# Patient Record
Sex: Female | Born: 1935 | Race: Black or African American | Hispanic: No | Marital: Married | Smoking: Never smoker
Health system: Southern US, Community
[De-identification: ages and names within clinical notes are randomized; demographics above are authoritative.]

## PROBLEM LIST (undated history)

## (undated) DIAGNOSIS — I10 Essential (primary) hypertension: Secondary | ICD-10-CM

## (undated) DIAGNOSIS — M1990 Unspecified osteoarthritis, unspecified site: Secondary | ICD-10-CM

## (undated) DIAGNOSIS — I4891 Unspecified atrial fibrillation: Secondary | ICD-10-CM

## (undated) DIAGNOSIS — N3941 Urge incontinence: Secondary | ICD-10-CM

---

## 2005-09-25 ENCOUNTER — Emergency Department: Payer: Self-pay | Admitting: Emergency Medicine

## 2005-11-09 ENCOUNTER — Ambulatory Visit: Payer: Self-pay | Admitting: Gastroenterology

## 2006-06-30 ENCOUNTER — Other Ambulatory Visit: Payer: Self-pay

## 2006-06-30 ENCOUNTER — Inpatient Hospital Stay: Payer: Self-pay | Admitting: Internal Medicine

## 2008-12-09 ENCOUNTER — Emergency Department: Payer: Self-pay | Admitting: Emergency Medicine

## 2009-03-19 DIAGNOSIS — I10 Essential (primary) hypertension: Secondary | ICD-10-CM | POA: Diagnosis present

## 2009-04-16 ENCOUNTER — Ambulatory Visit: Payer: Self-pay | Admitting: Family Medicine

## 2009-04-27 ENCOUNTER — Emergency Department: Payer: Self-pay | Admitting: Internal Medicine

## 2009-06-28 ENCOUNTER — Ambulatory Visit: Payer: Self-pay | Admitting: Family Medicine

## 2009-07-15 ENCOUNTER — Ambulatory Visit: Payer: Self-pay | Admitting: Family Medicine

## 2010-10-14 ENCOUNTER — Ambulatory Visit: Payer: Self-pay | Admitting: Family Medicine

## 2011-02-27 ENCOUNTER — Emergency Department: Payer: Self-pay | Admitting: *Deleted

## 2011-03-12 ENCOUNTER — Ambulatory Visit: Payer: Self-pay | Admitting: Family Medicine

## 2011-05-06 ENCOUNTER — Ambulatory Visit: Payer: Self-pay | Admitting: Family Medicine

## 2011-06-25 ENCOUNTER — Ambulatory Visit: Payer: Self-pay | Admitting: Unknown Physician Specialty

## 2011-07-11 ENCOUNTER — Emergency Department: Payer: Self-pay | Admitting: Emergency Medicine

## 2011-07-11 LAB — CBC
HCT: 44.3 % (ref 35.0–47.0)
HGB: 14 g/dL (ref 12.0–16.0)
MCH: 27.2 pg (ref 26.0–34.0)
MCV: 86 fL (ref 80–100)
RBC: 5.16 10*6/uL (ref 3.80–5.20)
RDW: 15.1 % — ABNORMAL HIGH (ref 11.5–14.5)
WBC: 8.5 10*3/uL (ref 3.6–11.0)

## 2011-07-11 LAB — COMPREHENSIVE METABOLIC PANEL
Alkaline Phosphatase: 78 U/L (ref 50–136)
Anion Gap: 9 (ref 7–16)
Bilirubin,Total: 0.4 mg/dL (ref 0.2–1.0)
Chloride: 107 mmol/L (ref 98–107)
Co2: 28 mmol/L (ref 21–32)
Creatinine: 0.86 mg/dL (ref 0.60–1.30)
EGFR (African American): >60
EGFR (Non-African Amer.): >60
Osmolality: 287 (ref 275–301)
Potassium: 3.5 mmol/L (ref 3.5–5.1)
Total Protein: 8.4 g/dL — ABNORMAL HIGH (ref 6.4–8.2)

## 2011-07-11 LAB — URINALYSIS, COMPLETE
Bilirubin,UR: NEGATIVE
Blood: NEGATIVE
Glucose,UR: NEGATIVE mg/dL (ref 0–75)
Ketone: NEGATIVE
Nitrite: NEGATIVE
Ph: 9 (ref 4.5–8.0)
RBC,UR: 10 /HPF (ref 0–5)
Specific Gravity: 1.008 (ref 1.003–1.030)
Squamous Epithelial: 13
WBC UR: 11 /HPF (ref 0–5)

## 2011-07-11 LAB — TROPONIN I: Troponin-I: <0.02 ng/mL

## 2011-07-13 LAB — URINE CULTURE

## 2011-08-13 ENCOUNTER — Ambulatory Visit: Payer: Self-pay | Admitting: Family Medicine

## 2014-03-26 DIAGNOSIS — M542 Cervicalgia: Secondary | ICD-10-CM | POA: Diagnosis not present

## 2014-03-26 DIAGNOSIS — G43719 Chronic migraine without aura, intractable, without status migrainosus: Secondary | ICD-10-CM | POA: Diagnosis not present

## 2014-03-26 DIAGNOSIS — G518 Other disorders of facial nerve: Secondary | ICD-10-CM | POA: Diagnosis not present

## 2014-03-26 DIAGNOSIS — R51 Headache: Secondary | ICD-10-CM | POA: Diagnosis not present

## 2014-03-26 DIAGNOSIS — M791 Myalgia: Secondary | ICD-10-CM | POA: Diagnosis not present

## 2014-04-11 DIAGNOSIS — G43719 Chronic migraine without aura, intractable, without status migrainosus: Secondary | ICD-10-CM | POA: Diagnosis not present

## 2014-04-11 DIAGNOSIS — M542 Cervicalgia: Secondary | ICD-10-CM | POA: Diagnosis not present

## 2014-04-11 DIAGNOSIS — G518 Other disorders of facial nerve: Secondary | ICD-10-CM | POA: Diagnosis not present

## 2014-04-11 DIAGNOSIS — M791 Myalgia: Secondary | ICD-10-CM | POA: Diagnosis not present

## 2014-04-11 DIAGNOSIS — R51 Headache: Secondary | ICD-10-CM | POA: Diagnosis not present

## 2014-04-22 DIAGNOSIS — R51 Headache: Secondary | ICD-10-CM | POA: Diagnosis not present

## 2014-04-22 DIAGNOSIS — R531 Weakness: Secondary | ICD-10-CM | POA: Diagnosis not present

## 2014-05-10 DIAGNOSIS — I10 Essential (primary) hypertension: Secondary | ICD-10-CM | POA: Diagnosis not present

## 2014-05-10 DIAGNOSIS — J3089 Other allergic rhinitis: Secondary | ICD-10-CM | POA: Diagnosis not present

## 2014-05-10 DIAGNOSIS — M159 Polyosteoarthritis, unspecified: Secondary | ICD-10-CM | POA: Diagnosis not present

## 2014-05-10 DIAGNOSIS — F419 Anxiety disorder, unspecified: Secondary | ICD-10-CM | POA: Diagnosis not present

## 2014-05-23 ENCOUNTER — Emergency Department: Payer: Self-pay | Admitting: Emergency Medicine

## 2014-05-23 DIAGNOSIS — R5383 Other fatigue: Secondary | ICD-10-CM | POA: Diagnosis not present

## 2014-05-23 DIAGNOSIS — E876 Hypokalemia: Secondary | ICD-10-CM | POA: Diagnosis not present

## 2014-06-11 DIAGNOSIS — R51 Headache: Secondary | ICD-10-CM | POA: Diagnosis not present

## 2014-06-11 DIAGNOSIS — G43719 Chronic migraine without aura, intractable, without status migrainosus: Secondary | ICD-10-CM | POA: Diagnosis not present

## 2014-08-06 DIAGNOSIS — F419 Anxiety disorder, unspecified: Secondary | ICD-10-CM | POA: Diagnosis not present

## 2014-08-06 DIAGNOSIS — T7840XD Allergy, unspecified, subsequent encounter: Secondary | ICD-10-CM | POA: Diagnosis not present

## 2014-08-06 DIAGNOSIS — M159 Polyosteoarthritis, unspecified: Secondary | ICD-10-CM | POA: Diagnosis not present

## 2014-08-06 DIAGNOSIS — R51 Headache: Secondary | ICD-10-CM | POA: Diagnosis not present

## 2014-08-06 DIAGNOSIS — I10 Essential (primary) hypertension: Secondary | ICD-10-CM | POA: Diagnosis not present

## 2014-08-12 DIAGNOSIS — G43719 Chronic migraine without aura, intractable, without status migrainosus: Secondary | ICD-10-CM | POA: Diagnosis not present

## 2014-08-13 DIAGNOSIS — R350 Frequency of micturition: Secondary | ICD-10-CM | POA: Diagnosis not present

## 2014-08-13 DIAGNOSIS — M159 Polyosteoarthritis, unspecified: Secondary | ICD-10-CM | POA: Diagnosis not present

## 2014-08-13 DIAGNOSIS — I10 Essential (primary) hypertension: Secondary | ICD-10-CM | POA: Diagnosis not present

## 2014-08-13 DIAGNOSIS — F419 Anxiety disorder, unspecified: Secondary | ICD-10-CM | POA: Diagnosis not present

## 2014-10-08 DIAGNOSIS — R51 Headache: Secondary | ICD-10-CM | POA: Diagnosis not present

## 2014-10-08 DIAGNOSIS — G518 Other disorders of facial nerve: Secondary | ICD-10-CM | POA: Diagnosis not present

## 2014-10-08 DIAGNOSIS — G43719 Chronic migraine without aura, intractable, without status migrainosus: Secondary | ICD-10-CM | POA: Diagnosis not present

## 2014-12-03 DIAGNOSIS — M159 Polyosteoarthritis, unspecified: Secondary | ICD-10-CM | POA: Diagnosis not present

## 2014-12-03 DIAGNOSIS — R51 Headache: Secondary | ICD-10-CM | POA: Diagnosis not present

## 2014-12-03 DIAGNOSIS — F419 Anxiety disorder, unspecified: Secondary | ICD-10-CM | POA: Diagnosis not present

## 2014-12-03 DIAGNOSIS — I10 Essential (primary) hypertension: Secondary | ICD-10-CM | POA: Diagnosis not present

## 2014-12-04 DIAGNOSIS — I10 Essential (primary) hypertension: Secondary | ICD-10-CM | POA: Diagnosis not present

## 2014-12-04 DIAGNOSIS — R51 Headache: Secondary | ICD-10-CM | POA: Diagnosis not present

## 2014-12-04 DIAGNOSIS — R350 Frequency of micturition: Secondary | ICD-10-CM | POA: Diagnosis not present

## 2014-12-04 DIAGNOSIS — M159 Polyosteoarthritis, unspecified: Secondary | ICD-10-CM | POA: Diagnosis not present

## 2014-12-04 DIAGNOSIS — F419 Anxiety disorder, unspecified: Secondary | ICD-10-CM | POA: Diagnosis not present

## 2014-12-10 DIAGNOSIS — Z23 Encounter for immunization: Secondary | ICD-10-CM | POA: Diagnosis not present

## 2014-12-10 DIAGNOSIS — F419 Anxiety disorder, unspecified: Secondary | ICD-10-CM | POA: Diagnosis not present

## 2014-12-10 DIAGNOSIS — I10 Essential (primary) hypertension: Secondary | ICD-10-CM | POA: Diagnosis not present

## 2014-12-10 DIAGNOSIS — M159 Polyosteoarthritis, unspecified: Secondary | ICD-10-CM | POA: Diagnosis not present

## 2014-12-10 DIAGNOSIS — R5383 Other fatigue: Secondary | ICD-10-CM | POA: Diagnosis not present

## 2014-12-23 DIAGNOSIS — G43719 Chronic migraine without aura, intractable, without status migrainosus: Secondary | ICD-10-CM | POA: Diagnosis not present

## 2015-01-08 DIAGNOSIS — H8110 Benign paroxysmal vertigo, unspecified ear: Secondary | ICD-10-CM | POA: Diagnosis not present

## 2015-01-08 DIAGNOSIS — R002 Palpitations: Secondary | ICD-10-CM | POA: Diagnosis not present

## 2015-01-08 DIAGNOSIS — R5383 Other fatigue: Secondary | ICD-10-CM | POA: Diagnosis not present

## 2015-02-14 ENCOUNTER — Emergency Department
Admission: EM | Admit: 2015-02-14 | Discharge: 2015-02-14 | Disposition: A | Discharge status: Home / Self Care | Payer: Commercial Managed Care - HMO | Attending: Emergency Medicine | Admitting: Emergency Medicine

## 2015-02-14 ENCOUNTER — Emergency Department: Payer: Commercial Managed Care - HMO

## 2015-02-14 DIAGNOSIS — R0600 Dyspnea, unspecified: Secondary | ICD-10-CM | POA: Diagnosis not present

## 2015-02-14 DIAGNOSIS — Z880 Allergy status to penicillin: Secondary | ICD-10-CM | POA: Insufficient documentation

## 2015-02-14 DIAGNOSIS — I10 Essential (primary) hypertension: Secondary | ICD-10-CM | POA: Insufficient documentation

## 2015-02-14 DIAGNOSIS — R05 Cough: Secondary | ICD-10-CM | POA: Diagnosis not present

## 2015-02-14 DIAGNOSIS — R0602 Shortness of breath: Secondary | ICD-10-CM | POA: Diagnosis not present

## 2015-02-14 DIAGNOSIS — R0981 Nasal congestion: Secondary | ICD-10-CM | POA: Insufficient documentation

## 2015-02-14 DIAGNOSIS — R51 Headache: Secondary | ICD-10-CM | POA: Diagnosis not present

## 2015-02-14 HISTORY — DX: Unspecified osteoarthritis, unspecified site: M19.90

## 2015-02-14 HISTORY — DX: Essential (primary) hypertension: I10

## 2015-02-14 LAB — CBC
HEMATOCRIT: 39.6 % (ref 35.0–47.0)
HEMOGLOBIN: 12.8 g/dL (ref 12.0–16.0)
MCH: 27.8 pg (ref 26.0–34.0)
MCHC: 32.2 g/dL (ref 32.0–36.0)
MCV: 86.2 fL (ref 80.0–100.0)
Platelets: 218 10*3/uL (ref 150–440)
RBC: 4.6 MIL/uL (ref 3.80–5.20)
RDW: 15.8 % — AB (ref 11.5–14.5)
WBC: 7.7 10*3/uL (ref 3.6–11.0)

## 2015-02-14 LAB — COMPREHENSIVE METABOLIC PANEL
ALK PHOS: 62 U/L (ref 38–126)
ALT: 10 U/L — ABNORMAL LOW (ref 14–54)
AST: 29 U/L (ref 15–41)
Albumin: 3.7 g/dL (ref 3.5–5.0)
Anion gap: 9 (ref 5–15)
BILIRUBIN TOTAL: 1.3 mg/dL — AB (ref 0.3–1.2)
BUN: 19 mg/dL (ref 6–20)
CALCIUM: 10 mg/dL (ref 8.9–10.3)
CO2: 22 mmol/L (ref 22–32)
Chloride: 111 mmol/L (ref 101–111)
Creatinine, Ser: 0.9 mg/dL (ref 0.44–1.00)
GFR calc Af Amer: >60 mL/min (ref >60)
GFR, EST NON AFRICAN AMERICAN: 59 mL/min — AB (ref >60)
Glucose, Bld: 82 mg/dL (ref 65–99)
POTASSIUM: 3.9 mmol/L (ref 3.5–5.1)
Sodium: 142 mmol/L (ref 135–145)
TOTAL PROTEIN: 7.1 g/dL (ref 6.5–8.1)

## 2015-02-14 LAB — TROPONIN I

## 2015-02-14 MED ORDER — OXYMETAZOLINE HCL 0.05 % NA SOLN
1.0000 | Freq: Once | NASAL | Status: AC
  Administered 2015-02-14: 1 via NASAL
  Filled 2015-02-14: qty 15

## 2015-02-14 MED ORDER — IOHEXOL 350 MG/ML SOLN
75.0000 mL | Freq: Once | INTRAVENOUS | Status: AC | PRN
Start: 2015-02-14 — End: 2015-02-14
  Administered 2015-02-14: 75 mL via INTRAVENOUS

## 2015-02-14 NOTE — ED Provider Notes (Signed)
[facility] Emergency Department Provider Note  Time seen: 5:09 PM  I have reviewed the triage vital signs and the nursing notes.   HISTORY  Chief Complaint Shortness of Breath    HPI Cassandra Weiss is a 81 y.o. female with a past medical history of hypertension, arthritis, who presents the emergency department with dyspnea. According to the patient for the past 2 days she has been feeling short of breath, somewhat worse with exertion. Denies any chest pain, pressure, tightness. Denies any fever. She does state nasal congestion, but denies cough. Upon arrival to the emergency department the patient has 100% O2 saturation on room air. She does take very quick small breaths, unclear as to the cause. States she feels mildly short of breath currently. Denies any chest discomfort or pleuritic pain.     Past Medical History  Diagnosis Date  . Arthritis   . Hypertension     There are no active problems to display for this patient.   History reviewed. No pertinent past surgical history.  No current outpatient prescriptions on file.  Allergies Penicillins  No family history on file.  Social History Social History  Substance Use Topics  . Smoking status: Never Smoker   . Smokeless tobacco: None  . Alcohol Use: No    Review of Systems Constitutional: Negative for fever. Eyes: Positive nasal congestion Cardiovascular: Negative for chest pain. Respiratory: Positive shortness of breath Gastrointestinal: Negative for abdominal pain Musculoskeletal: Negative for back pain. Neurological: Negative for headache 10-point ROS otherwise negative.  ____________________________________________   PHYSICAL EXAM:  VITAL SIGNS: ED Triage Vitals  Enc Vitals Group     BP 02/14/15 1439 146/61 mmHg     Pulse Rate 02/14/15 1439 52     Resp 02/14/15 1439 26     Temp 02/14/15 1439 97.8 F (36.6 C)     Temp Source 02/14/15 1439 Oral     SpO2 02/14/15 1439 99 %      Weight 02/14/15 1439 138 lb (62.596 kg)     Height 02/14/15 1439 5\' 2"  (1.575 m)     Head Cir --      Peak Flow --      Pain Score 02/14/15 1440 6     Pain Loc --      Pain Edu? --      Excl. in GC? --     Constitutional: Alert and oriented. Well appearing and in no distress. Eyes: Normal exam ENT   Head: Normocephalic and atraumatic.   Mouth/Throat: Mucous membranes are moist. Cardiovascular: Normal rate, regular rhythm. No murmur Respiratory: Normal respiratory effort without tachypnea nor retractions. Breath sounds are clear and equal bilaterally. No wheezes/rales/rhonchi. Gastrointestinal: Soft and nontender. No distention.  Musculoskeletal: Nontender with normal range of motion in all extremities. No lower extremity tenderness or edema. Neurologic:  Normal speech and language. No gross focal neurologic deficits Skin:  Skin is warm, dry and intact.  Psychiatric: Mood and affect are normal. Speech and behavior are normal.   ____________________________________________    EKG  EKG reviewed and interpreted by sepsis sinus bradycardia at 55 bpm, narrow QRS, normal axis, normal intervals besides a slightly shortened PR segment, no concerning ST changes noted.  ____________________________________________    RADIOLOGY  Chest x-ray shows no acute abnormality CT head unchanged CT chest shows no acute abnormalities.  ____________________________________________    INITIAL IMPRESSION / ASSESSMENT AND PLAN / ED COURSE  Pertinent labs & imaging results that were available during my care  of the patient were reviewed by me and considered in my medical decision making (see chart for details).  Patient presents the emergency department 2 days of shortness of breath. Upon arrival the patient does take rapid short breaths at times, she does not know why she does this per patient. Denies any chest pain, pressure, tightness. She is in no distress. Overall appears very well  with a normal physical exam. No lower extremity edema or tenderness. Clear breath sounds bilaterally without wheeze, rale or rhonchi. Chest x-ray shows no acute abnormality. EKG appears well. We will check basic labs, we will treat with Afrin as the patient does have moderate nasal congestion which may aide in the patient's breathing.   Ultrasound-guided IV placed by myself, no complications. Well tolerated.  CT chest and head are within normal limits/no abnormalities. Patient states she is feeling better, we will discharge home with primary care follow-up on Monday. It is not entirely clear the cause of her shortness of breath. However she states she does feel better after Afrin administration.  ____________________________________________   FINAL CLINICAL IMPRESSION(S) / ED DIAGNOSES  Dyspnea   Minna Antis, MD 02/14/15 2137

## 2015-02-14 NOTE — ED Notes (Signed)
2 unsuccessful IV attempts by this RN and 1 unsuccessful attempt by Missy SabinsArnell, [employer] Tech. Paduchowski, MD made aware.

## 2015-02-14 NOTE — Discharge Instructions (Signed)

## 2015-02-14 NOTE — ED Notes (Addendum)
Pt c/o SOB for the past 2 days, pt is hyperventilating in triage with short shallow breathes, in NAD, pt instructed to take slow deep breathes, O2 100%.. Pt c/o palpitations with the SOB.. Pt states [facility] and hearing sounds in her head..Marland Kitchen

## 2015-03-04 DIAGNOSIS — G43719 Chronic migraine without aura, intractable, without status migrainosus: Secondary | ICD-10-CM | POA: Diagnosis not present

## 2015-04-03 DIAGNOSIS — R5383 Other fatigue: Secondary | ICD-10-CM | POA: Diagnosis not present

## 2015-04-03 DIAGNOSIS — Z23 Encounter for immunization: Secondary | ICD-10-CM | POA: Diagnosis not present

## 2015-04-03 DIAGNOSIS — I10 Essential (primary) hypertension: Secondary | ICD-10-CM | POA: Diagnosis not present

## 2015-04-03 DIAGNOSIS — M159 Polyosteoarthritis, unspecified: Secondary | ICD-10-CM | POA: Diagnosis not present

## 2015-04-09 DIAGNOSIS — I10 Essential (primary) hypertension: Secondary | ICD-10-CM | POA: Diagnosis not present

## 2015-04-09 DIAGNOSIS — F419 Anxiety disorder, unspecified: Secondary | ICD-10-CM | POA: Diagnosis not present

## 2015-04-09 DIAGNOSIS — R0602 Shortness of breath: Secondary | ICD-10-CM | POA: Diagnosis not present

## 2015-04-16 DIAGNOSIS — R0602 Shortness of breath: Secondary | ICD-10-CM | POA: Diagnosis not present

## 2015-06-03 DIAGNOSIS — G43719 Chronic migraine without aura, intractable, without status migrainosus: Secondary | ICD-10-CM | POA: Diagnosis not present

## 2015-07-31 DIAGNOSIS — I10 Essential (primary) hypertension: Secondary | ICD-10-CM | POA: Diagnosis not present

## 2015-07-31 DIAGNOSIS — F419 Anxiety disorder, unspecified: Secondary | ICD-10-CM | POA: Diagnosis not present

## 2015-07-31 DIAGNOSIS — R0602 Shortness of breath: Secondary | ICD-10-CM | POA: Diagnosis not present

## 2015-08-07 DIAGNOSIS — F419 Anxiety disorder, unspecified: Secondary | ICD-10-CM | POA: Diagnosis not present

## 2015-08-07 DIAGNOSIS — J309 Allergic rhinitis, unspecified: Secondary | ICD-10-CM | POA: Diagnosis not present

## 2015-08-07 DIAGNOSIS — R5383 Other fatigue: Secondary | ICD-10-CM | POA: Diagnosis not present

## 2015-08-07 DIAGNOSIS — R5381 Other malaise: Secondary | ICD-10-CM | POA: Diagnosis not present

## 2015-08-07 DIAGNOSIS — F329 Major depressive disorder, single episode, unspecified: Secondary | ICD-10-CM | POA: Diagnosis not present

## 2015-08-07 DIAGNOSIS — I10 Essential (primary) hypertension: Secondary | ICD-10-CM | POA: Diagnosis not present

## 2015-08-07 DIAGNOSIS — M159 Polyosteoarthritis, unspecified: Secondary | ICD-10-CM | POA: Diagnosis not present

## 2015-09-30 DIAGNOSIS — G43719 Chronic migraine without aura, intractable, without status migrainosus: Secondary | ICD-10-CM | POA: Diagnosis not present

## 2015-10-29 DIAGNOSIS — R5383 Other fatigue: Secondary | ICD-10-CM | POA: Diagnosis not present

## 2015-10-29 DIAGNOSIS — R5381 Other malaise: Secondary | ICD-10-CM | POA: Diagnosis not present

## 2015-10-29 DIAGNOSIS — M159 Polyosteoarthritis, unspecified: Secondary | ICD-10-CM | POA: Diagnosis not present

## 2015-10-29 DIAGNOSIS — I10 Essential (primary) hypertension: Secondary | ICD-10-CM | POA: Diagnosis not present

## 2015-10-29 DIAGNOSIS — J309 Allergic rhinitis, unspecified: Secondary | ICD-10-CM | POA: Diagnosis not present

## 2015-11-05 DIAGNOSIS — F419 Anxiety disorder, unspecified: Secondary | ICD-10-CM | POA: Diagnosis not present

## 2015-11-05 DIAGNOSIS — R5383 Other fatigue: Secondary | ICD-10-CM | POA: Diagnosis not present

## 2015-11-05 DIAGNOSIS — I517 Cardiomegaly: Secondary | ICD-10-CM | POA: Diagnosis not present

## 2015-11-05 DIAGNOSIS — M159 Polyosteoarthritis, unspecified: Secondary | ICD-10-CM | POA: Diagnosis not present

## 2015-11-05 DIAGNOSIS — R0602 Shortness of breath: Secondary | ICD-10-CM | POA: Diagnosis not present

## 2015-11-05 DIAGNOSIS — R5381 Other malaise: Secondary | ICD-10-CM | POA: Diagnosis not present

## 2015-11-05 DIAGNOSIS — F3341 Major depressive disorder, recurrent, in partial remission: Secondary | ICD-10-CM | POA: Diagnosis not present

## 2015-11-05 DIAGNOSIS — I10 Essential (primary) hypertension: Secondary | ICD-10-CM | POA: Diagnosis not present

## 2015-12-16 DIAGNOSIS — Z23 Encounter for immunization: Secondary | ICD-10-CM | POA: Diagnosis not present

## 2015-12-16 DIAGNOSIS — F419 Anxiety disorder, unspecified: Secondary | ICD-10-CM | POA: Diagnosis not present

## 2015-12-16 DIAGNOSIS — I10 Essential (primary) hypertension: Secondary | ICD-10-CM | POA: Diagnosis not present

## 2015-12-16 DIAGNOSIS — R531 Weakness: Secondary | ICD-10-CM | POA: Diagnosis not present

## 2015-12-16 DIAGNOSIS — F329 Major depressive disorder, single episode, unspecified: Secondary | ICD-10-CM | POA: Diagnosis not present

## 2015-12-16 DIAGNOSIS — R197 Diarrhea, unspecified: Secondary | ICD-10-CM | POA: Diagnosis not present

## 2016-03-02 DIAGNOSIS — R0602 Shortness of breath: Secondary | ICD-10-CM | POA: Diagnosis not present

## 2016-03-02 DIAGNOSIS — I10 Essential (primary) hypertension: Secondary | ICD-10-CM | POA: Diagnosis not present

## 2016-03-02 DIAGNOSIS — F419 Anxiety disorder, unspecified: Secondary | ICD-10-CM | POA: Diagnosis not present

## 2016-03-02 DIAGNOSIS — R5383 Other fatigue: Secondary | ICD-10-CM | POA: Diagnosis not present

## 2016-03-02 DIAGNOSIS — R5381 Other malaise: Secondary | ICD-10-CM | POA: Diagnosis not present

## 2016-03-02 DIAGNOSIS — M159 Polyosteoarthritis, unspecified: Secondary | ICD-10-CM | POA: Diagnosis not present

## 2016-03-09 DIAGNOSIS — L600 Ingrowing nail: Secondary | ICD-10-CM | POA: Diagnosis not present

## 2016-03-09 DIAGNOSIS — E538 Deficiency of other specified B group vitamins: Secondary | ICD-10-CM | POA: Diagnosis not present

## 2016-03-09 DIAGNOSIS — I10 Essential (primary) hypertension: Secondary | ICD-10-CM | POA: Diagnosis not present

## 2016-03-09 DIAGNOSIS — J302 Other seasonal allergic rhinitis: Secondary | ICD-10-CM | POA: Diagnosis not present

## 2016-03-09 DIAGNOSIS — Z0000 Encounter for general adult medical examination without abnormal findings: Secondary | ICD-10-CM | POA: Diagnosis not present

## 2016-03-09 DIAGNOSIS — M159 Polyosteoarthritis, unspecified: Secondary | ICD-10-CM | POA: Diagnosis not present

## 2016-03-09 DIAGNOSIS — F419 Anxiety disorder, unspecified: Secondary | ICD-10-CM | POA: Diagnosis not present

## 2016-03-31 DIAGNOSIS — M79675 Pain in left toe(s): Secondary | ICD-10-CM | POA: Diagnosis not present

## 2016-03-31 DIAGNOSIS — B351 Tinea unguium: Secondary | ICD-10-CM | POA: Diagnosis not present

## 2016-03-31 DIAGNOSIS — M79674 Pain in right toe(s): Secondary | ICD-10-CM | POA: Diagnosis not present

## 2016-05-11 DIAGNOSIS — R4781 Slurred speech: Secondary | ICD-10-CM | POA: Diagnosis not present

## 2016-05-11 DIAGNOSIS — E785 Hyperlipidemia, unspecified: Secondary | ICD-10-CM | POA: Diagnosis not present

## 2016-05-11 DIAGNOSIS — I351 Nonrheumatic aortic (valve) insufficiency: Secondary | ICD-10-CM | POA: Diagnosis not present

## 2016-05-11 DIAGNOSIS — I10 Essential (primary) hypertension: Secondary | ICD-10-CM | POA: Diagnosis not present

## 2016-05-11 DIAGNOSIS — R29810 Facial weakness: Secondary | ICD-10-CM | POA: Diagnosis not present

## 2016-05-11 DIAGNOSIS — I480 Paroxysmal atrial fibrillation: Secondary | ICD-10-CM | POA: Diagnosis not present

## 2016-05-11 DIAGNOSIS — F419 Anxiety disorder, unspecified: Secondary | ICD-10-CM | POA: Diagnosis not present

## 2016-05-11 DIAGNOSIS — M1990 Unspecified osteoarthritis, unspecified site: Secondary | ICD-10-CM | POA: Diagnosis not present

## 2016-05-11 DIAGNOSIS — I639 Cerebral infarction, unspecified: Secondary | ICD-10-CM | POA: Diagnosis not present

## 2016-05-11 DIAGNOSIS — R200 Anesthesia of skin: Secondary | ICD-10-CM | POA: Diagnosis not present

## 2016-05-11 DIAGNOSIS — I2510 Atherosclerotic heart disease of native coronary artery without angina pectoris: Secondary | ICD-10-CM | POA: Diagnosis not present

## 2016-05-11 DIAGNOSIS — I6521 Occlusion and stenosis of right carotid artery: Secondary | ICD-10-CM | POA: Diagnosis not present

## 2016-05-11 DIAGNOSIS — R471 Dysarthria and anarthria: Secondary | ICD-10-CM | POA: Diagnosis not present

## 2016-05-11 DIAGNOSIS — G4700 Insomnia, unspecified: Secondary | ICD-10-CM | POA: Diagnosis not present

## 2016-05-11 DIAGNOSIS — I6340 Cerebral infarction due to embolism of unspecified cerebral artery: Secondary | ICD-10-CM | POA: Diagnosis not present

## 2016-05-12 DIAGNOSIS — I2510 Atherosclerotic heart disease of native coronary artery without angina pectoris: Secondary | ICD-10-CM | POA: Diagnosis present

## 2016-05-12 DIAGNOSIS — E785 Hyperlipidemia, unspecified: Secondary | ICD-10-CM | POA: Diagnosis present

## 2016-05-13 DIAGNOSIS — I2510 Atherosclerotic heart disease of native coronary artery without angina pectoris: Secondary | ICD-10-CM | POA: Diagnosis not present

## 2016-05-13 DIAGNOSIS — I6340 Cerebral infarction due to embolism of unspecified cerebral artery: Secondary | ICD-10-CM | POA: Diagnosis not present

## 2016-05-13 DIAGNOSIS — I480 Paroxysmal atrial fibrillation: Secondary | ICD-10-CM | POA: Diagnosis not present

## 2016-05-13 DIAGNOSIS — I10 Essential (primary) hypertension: Secondary | ICD-10-CM | POA: Diagnosis not present

## 2016-05-13 DIAGNOSIS — R471 Dysarthria and anarthria: Secondary | ICD-10-CM | POA: Diagnosis not present

## 2016-05-13 DIAGNOSIS — E785 Hyperlipidemia, unspecified: Secondary | ICD-10-CM | POA: Diagnosis not present

## 2016-05-13 DIAGNOSIS — I6521 Occlusion and stenosis of right carotid artery: Secondary | ICD-10-CM | POA: Diagnosis not present

## 2016-05-13 DIAGNOSIS — R29810 Facial weakness: Secondary | ICD-10-CM | POA: Diagnosis not present

## 2016-05-13 DIAGNOSIS — M1990 Unspecified osteoarthritis, unspecified site: Secondary | ICD-10-CM | POA: Diagnosis not present

## 2016-05-13 DIAGNOSIS — I639 Cerebral infarction, unspecified: Secondary | ICD-10-CM | POA: Diagnosis not present

## 2016-05-14 DIAGNOSIS — I480 Paroxysmal atrial fibrillation: Secondary | ICD-10-CM | POA: Diagnosis present

## 2016-05-17 DIAGNOSIS — J439 Emphysema, unspecified: Secondary | ICD-10-CM | POA: Diagnosis not present

## 2016-05-17 DIAGNOSIS — I480 Paroxysmal atrial fibrillation: Secondary | ICD-10-CM | POA: Diagnosis not present

## 2016-05-17 DIAGNOSIS — I69393 Ataxia following cerebral infarction: Secondary | ICD-10-CM | POA: Diagnosis not present

## 2016-05-17 DIAGNOSIS — F419 Anxiety disorder, unspecified: Secondary | ICD-10-CM | POA: Diagnosis not present

## 2016-05-17 DIAGNOSIS — M1991 Primary osteoarthritis, unspecified site: Secondary | ICD-10-CM | POA: Diagnosis not present

## 2016-05-17 DIAGNOSIS — I10 Essential (primary) hypertension: Secondary | ICD-10-CM | POA: Diagnosis not present

## 2016-05-18 DIAGNOSIS — I480 Paroxysmal atrial fibrillation: Secondary | ICD-10-CM | POA: Diagnosis not present

## 2016-05-18 DIAGNOSIS — E784 Other hyperlipidemia: Secondary | ICD-10-CM | POA: Diagnosis not present

## 2016-05-18 DIAGNOSIS — Z09 Encounter for follow-up examination after completed treatment for conditions other than malignant neoplasm: Secondary | ICD-10-CM | POA: Diagnosis not present

## 2016-05-18 DIAGNOSIS — I639 Cerebral infarction, unspecified: Secondary | ICD-10-CM | POA: Diagnosis not present

## 2016-05-18 DIAGNOSIS — R001 Bradycardia, unspecified: Secondary | ICD-10-CM | POA: Diagnosis not present

## 2016-05-18 DIAGNOSIS — I499 Cardiac arrhythmia, unspecified: Secondary | ICD-10-CM | POA: Diagnosis not present

## 2016-05-19 DIAGNOSIS — M1991 Primary osteoarthritis, unspecified site: Secondary | ICD-10-CM | POA: Diagnosis not present

## 2016-05-19 DIAGNOSIS — J439 Emphysema, unspecified: Secondary | ICD-10-CM | POA: Diagnosis not present

## 2016-05-19 DIAGNOSIS — F419 Anxiety disorder, unspecified: Secondary | ICD-10-CM | POA: Diagnosis not present

## 2016-05-19 DIAGNOSIS — I69393 Ataxia following cerebral infarction: Secondary | ICD-10-CM | POA: Diagnosis not present

## 2016-05-19 DIAGNOSIS — I10 Essential (primary) hypertension: Secondary | ICD-10-CM | POA: Diagnosis not present

## 2016-05-19 DIAGNOSIS — I480 Paroxysmal atrial fibrillation: Secondary | ICD-10-CM | POA: Diagnosis not present

## 2016-05-20 DIAGNOSIS — F419 Anxiety disorder, unspecified: Secondary | ICD-10-CM | POA: Diagnosis not present

## 2016-05-20 DIAGNOSIS — M1991 Primary osteoarthritis, unspecified site: Secondary | ICD-10-CM | POA: Diagnosis not present

## 2016-05-20 DIAGNOSIS — I69393 Ataxia following cerebral infarction: Secondary | ICD-10-CM | POA: Diagnosis not present

## 2016-05-20 DIAGNOSIS — I480 Paroxysmal atrial fibrillation: Secondary | ICD-10-CM | POA: Diagnosis not present

## 2016-05-20 DIAGNOSIS — I10 Essential (primary) hypertension: Secondary | ICD-10-CM | POA: Diagnosis not present

## 2016-05-20 DIAGNOSIS — J439 Emphysema, unspecified: Secondary | ICD-10-CM | POA: Diagnosis not present

## 2016-05-24 DIAGNOSIS — F419 Anxiety disorder, unspecified: Secondary | ICD-10-CM | POA: Diagnosis not present

## 2016-05-24 DIAGNOSIS — J439 Emphysema, unspecified: Secondary | ICD-10-CM | POA: Diagnosis not present

## 2016-05-24 DIAGNOSIS — M1991 Primary osteoarthritis, unspecified site: Secondary | ICD-10-CM | POA: Diagnosis not present

## 2016-05-24 DIAGNOSIS — I10 Essential (primary) hypertension: Secondary | ICD-10-CM | POA: Diagnosis not present

## 2016-05-24 DIAGNOSIS — I69393 Ataxia following cerebral infarction: Secondary | ICD-10-CM | POA: Diagnosis not present

## 2016-05-24 DIAGNOSIS — I480 Paroxysmal atrial fibrillation: Secondary | ICD-10-CM | POA: Diagnosis not present

## 2016-05-25 DIAGNOSIS — J439 Emphysema, unspecified: Secondary | ICD-10-CM | POA: Diagnosis not present

## 2016-05-25 DIAGNOSIS — I480 Paroxysmal atrial fibrillation: Secondary | ICD-10-CM | POA: Diagnosis not present

## 2016-05-25 DIAGNOSIS — F419 Anxiety disorder, unspecified: Secondary | ICD-10-CM | POA: Diagnosis not present

## 2016-05-25 DIAGNOSIS — I10 Essential (primary) hypertension: Secondary | ICD-10-CM | POA: Diagnosis not present

## 2016-05-25 DIAGNOSIS — M1991 Primary osteoarthritis, unspecified site: Secondary | ICD-10-CM | POA: Diagnosis not present

## 2016-05-25 DIAGNOSIS — I69393 Ataxia following cerebral infarction: Secondary | ICD-10-CM | POA: Diagnosis not present

## 2016-05-26 DIAGNOSIS — F419 Anxiety disorder, unspecified: Secondary | ICD-10-CM | POA: Diagnosis not present

## 2016-05-26 DIAGNOSIS — I10 Essential (primary) hypertension: Secondary | ICD-10-CM | POA: Diagnosis not present

## 2016-05-26 DIAGNOSIS — J439 Emphysema, unspecified: Secondary | ICD-10-CM | POA: Diagnosis not present

## 2016-05-26 DIAGNOSIS — M1991 Primary osteoarthritis, unspecified site: Secondary | ICD-10-CM | POA: Diagnosis not present

## 2016-05-26 DIAGNOSIS — I69393 Ataxia following cerebral infarction: Secondary | ICD-10-CM | POA: Diagnosis not present

## 2016-05-26 DIAGNOSIS — I480 Paroxysmal atrial fibrillation: Secondary | ICD-10-CM | POA: Diagnosis not present

## 2016-05-28 DIAGNOSIS — J439 Emphysema, unspecified: Secondary | ICD-10-CM | POA: Diagnosis not present

## 2016-05-28 DIAGNOSIS — E784 Other hyperlipidemia: Secondary | ICD-10-CM | POA: Diagnosis not present

## 2016-05-28 DIAGNOSIS — I638 Other cerebral infarction: Secondary | ICD-10-CM | POA: Diagnosis not present

## 2016-05-28 DIAGNOSIS — I69393 Ataxia following cerebral infarction: Secondary | ICD-10-CM | POA: Diagnosis not present

## 2016-05-28 DIAGNOSIS — I480 Paroxysmal atrial fibrillation: Secondary | ICD-10-CM | POA: Diagnosis not present

## 2016-05-28 DIAGNOSIS — I10 Essential (primary) hypertension: Secondary | ICD-10-CM | POA: Diagnosis not present

## 2016-05-28 DIAGNOSIS — F419 Anxiety disorder, unspecified: Secondary | ICD-10-CM | POA: Diagnosis not present

## 2016-06-01 DIAGNOSIS — M1991 Primary osteoarthritis, unspecified site: Secondary | ICD-10-CM | POA: Diagnosis not present

## 2016-06-01 DIAGNOSIS — I69393 Ataxia following cerebral infarction: Secondary | ICD-10-CM | POA: Diagnosis not present

## 2016-06-01 DIAGNOSIS — F419 Anxiety disorder, unspecified: Secondary | ICD-10-CM | POA: Diagnosis not present

## 2016-06-01 DIAGNOSIS — J439 Emphysema, unspecified: Secondary | ICD-10-CM | POA: Diagnosis not present

## 2016-06-01 DIAGNOSIS — I480 Paroxysmal atrial fibrillation: Secondary | ICD-10-CM | POA: Diagnosis not present

## 2016-06-01 DIAGNOSIS — I10 Essential (primary) hypertension: Secondary | ICD-10-CM | POA: Diagnosis not present

## 2016-06-03 DIAGNOSIS — I480 Paroxysmal atrial fibrillation: Secondary | ICD-10-CM | POA: Diagnosis not present

## 2016-06-03 DIAGNOSIS — I10 Essential (primary) hypertension: Secondary | ICD-10-CM | POA: Diagnosis not present

## 2016-06-03 DIAGNOSIS — I69393 Ataxia following cerebral infarction: Secondary | ICD-10-CM | POA: Diagnosis not present

## 2016-06-03 DIAGNOSIS — F419 Anxiety disorder, unspecified: Secondary | ICD-10-CM | POA: Diagnosis not present

## 2016-06-03 DIAGNOSIS — M1991 Primary osteoarthritis, unspecified site: Secondary | ICD-10-CM | POA: Diagnosis not present

## 2016-06-03 DIAGNOSIS — J439 Emphysema, unspecified: Secondary | ICD-10-CM | POA: Diagnosis not present

## 2016-06-08 DIAGNOSIS — I69393 Ataxia following cerebral infarction: Secondary | ICD-10-CM | POA: Diagnosis not present

## 2016-06-08 DIAGNOSIS — J439 Emphysema, unspecified: Secondary | ICD-10-CM | POA: Diagnosis not present

## 2016-06-08 DIAGNOSIS — M1991 Primary osteoarthritis, unspecified site: Secondary | ICD-10-CM | POA: Diagnosis not present

## 2016-06-08 DIAGNOSIS — I10 Essential (primary) hypertension: Secondary | ICD-10-CM | POA: Diagnosis not present

## 2016-06-08 DIAGNOSIS — I480 Paroxysmal atrial fibrillation: Secondary | ICD-10-CM | POA: Diagnosis not present

## 2016-06-08 DIAGNOSIS — F419 Anxiety disorder, unspecified: Secondary | ICD-10-CM | POA: Diagnosis not present

## 2016-06-09 DIAGNOSIS — I638 Other cerebral infarction: Secondary | ICD-10-CM | POA: Diagnosis not present

## 2016-06-09 DIAGNOSIS — E784 Other hyperlipidemia: Secondary | ICD-10-CM | POA: Diagnosis not present

## 2016-06-09 DIAGNOSIS — I10 Essential (primary) hypertension: Secondary | ICD-10-CM | POA: Diagnosis not present

## 2016-06-09 DIAGNOSIS — I480 Paroxysmal atrial fibrillation: Secondary | ICD-10-CM | POA: Diagnosis not present

## 2016-06-10 DIAGNOSIS — I69393 Ataxia following cerebral infarction: Secondary | ICD-10-CM | POA: Diagnosis not present

## 2016-06-10 DIAGNOSIS — F419 Anxiety disorder, unspecified: Secondary | ICD-10-CM | POA: Diagnosis not present

## 2016-06-10 DIAGNOSIS — J439 Emphysema, unspecified: Secondary | ICD-10-CM | POA: Diagnosis not present

## 2016-06-10 DIAGNOSIS — M1991 Primary osteoarthritis, unspecified site: Secondary | ICD-10-CM | POA: Diagnosis not present

## 2016-06-10 DIAGNOSIS — I10 Essential (primary) hypertension: Secondary | ICD-10-CM | POA: Diagnosis not present

## 2016-06-10 DIAGNOSIS — I480 Paroxysmal atrial fibrillation: Secondary | ICD-10-CM | POA: Diagnosis not present

## 2016-06-15 DIAGNOSIS — I69393 Ataxia following cerebral infarction: Secondary | ICD-10-CM | POA: Diagnosis not present

## 2016-06-15 DIAGNOSIS — I10 Essential (primary) hypertension: Secondary | ICD-10-CM | POA: Diagnosis not present

## 2016-06-15 DIAGNOSIS — I480 Paroxysmal atrial fibrillation: Secondary | ICD-10-CM | POA: Diagnosis not present

## 2016-06-15 DIAGNOSIS — F419 Anxiety disorder, unspecified: Secondary | ICD-10-CM | POA: Diagnosis not present

## 2016-06-15 DIAGNOSIS — M1991 Primary osteoarthritis, unspecified site: Secondary | ICD-10-CM | POA: Diagnosis not present

## 2016-06-15 DIAGNOSIS — J439 Emphysema, unspecified: Secondary | ICD-10-CM | POA: Diagnosis not present

## 2016-06-16 DIAGNOSIS — F419 Anxiety disorder, unspecified: Secondary | ICD-10-CM | POA: Diagnosis not present

## 2016-06-16 DIAGNOSIS — I69393 Ataxia following cerebral infarction: Secondary | ICD-10-CM | POA: Diagnosis not present

## 2016-06-16 DIAGNOSIS — J439 Emphysema, unspecified: Secondary | ICD-10-CM | POA: Diagnosis not present

## 2016-06-16 DIAGNOSIS — I10 Essential (primary) hypertension: Secondary | ICD-10-CM | POA: Diagnosis not present

## 2016-06-16 DIAGNOSIS — I480 Paroxysmal atrial fibrillation: Secondary | ICD-10-CM | POA: Diagnosis not present

## 2016-06-16 DIAGNOSIS — M1991 Primary osteoarthritis, unspecified site: Secondary | ICD-10-CM | POA: Diagnosis not present

## 2016-06-22 DIAGNOSIS — F419 Anxiety disorder, unspecified: Secondary | ICD-10-CM | POA: Diagnosis not present

## 2016-06-22 DIAGNOSIS — I10 Essential (primary) hypertension: Secondary | ICD-10-CM | POA: Diagnosis not present

## 2016-06-22 DIAGNOSIS — J439 Emphysema, unspecified: Secondary | ICD-10-CM | POA: Diagnosis not present

## 2016-06-22 DIAGNOSIS — I69393 Ataxia following cerebral infarction: Secondary | ICD-10-CM | POA: Diagnosis not present

## 2016-06-22 DIAGNOSIS — I480 Paroxysmal atrial fibrillation: Secondary | ICD-10-CM | POA: Diagnosis not present

## 2016-06-22 DIAGNOSIS — M1991 Primary osteoarthritis, unspecified site: Secondary | ICD-10-CM | POA: Diagnosis not present

## 2016-06-23 DIAGNOSIS — F419 Anxiety disorder, unspecified: Secondary | ICD-10-CM | POA: Diagnosis not present

## 2016-06-23 DIAGNOSIS — M1991 Primary osteoarthritis, unspecified site: Secondary | ICD-10-CM | POA: Diagnosis not present

## 2016-06-23 DIAGNOSIS — I10 Essential (primary) hypertension: Secondary | ICD-10-CM | POA: Diagnosis not present

## 2016-06-23 DIAGNOSIS — I69393 Ataxia following cerebral infarction: Secondary | ICD-10-CM | POA: Diagnosis not present

## 2016-06-23 DIAGNOSIS — J439 Emphysema, unspecified: Secondary | ICD-10-CM | POA: Diagnosis not present

## 2016-06-23 DIAGNOSIS — I480 Paroxysmal atrial fibrillation: Secondary | ICD-10-CM | POA: Diagnosis not present

## 2016-06-29 DIAGNOSIS — E785 Hyperlipidemia, unspecified: Secondary | ICD-10-CM | POA: Diagnosis not present

## 2016-06-29 DIAGNOSIS — L600 Ingrowing nail: Secondary | ICD-10-CM | POA: Diagnosis not present

## 2016-06-29 DIAGNOSIS — F419 Anxiety disorder, unspecified: Secondary | ICD-10-CM | POA: Diagnosis not present

## 2016-06-29 DIAGNOSIS — J309 Allergic rhinitis, unspecified: Secondary | ICD-10-CM | POA: Diagnosis not present

## 2016-06-29 DIAGNOSIS — M159 Polyosteoarthritis, unspecified: Secondary | ICD-10-CM | POA: Diagnosis not present

## 2016-06-29 DIAGNOSIS — Z0000 Encounter for general adult medical examination without abnormal findings: Secondary | ICD-10-CM | POA: Diagnosis not present

## 2016-06-29 DIAGNOSIS — R946 Abnormal results of thyroid function studies: Secondary | ICD-10-CM | POA: Diagnosis not present

## 2016-06-29 DIAGNOSIS — I10 Essential (primary) hypertension: Secondary | ICD-10-CM | POA: Diagnosis not present

## 2016-06-30 DIAGNOSIS — I69393 Ataxia following cerebral infarction: Secondary | ICD-10-CM | POA: Diagnosis not present

## 2016-06-30 DIAGNOSIS — J439 Emphysema, unspecified: Secondary | ICD-10-CM | POA: Diagnosis not present

## 2016-06-30 DIAGNOSIS — I480 Paroxysmal atrial fibrillation: Secondary | ICD-10-CM | POA: Diagnosis not present

## 2016-06-30 DIAGNOSIS — I10 Essential (primary) hypertension: Secondary | ICD-10-CM | POA: Diagnosis not present

## 2016-06-30 DIAGNOSIS — F419 Anxiety disorder, unspecified: Secondary | ICD-10-CM | POA: Diagnosis not present

## 2016-06-30 DIAGNOSIS — M1991 Primary osteoarthritis, unspecified site: Secondary | ICD-10-CM | POA: Diagnosis not present

## 2016-07-06 DIAGNOSIS — Z7901 Long term (current) use of anticoagulants: Secondary | ICD-10-CM | POA: Diagnosis not present

## 2016-07-06 DIAGNOSIS — I10 Essential (primary) hypertension: Secondary | ICD-10-CM | POA: Diagnosis not present

## 2016-07-06 DIAGNOSIS — Z0000 Encounter for general adult medical examination without abnormal findings: Secondary | ICD-10-CM | POA: Diagnosis not present

## 2016-07-06 DIAGNOSIS — I639 Cerebral infarction, unspecified: Secondary | ICD-10-CM | POA: Diagnosis not present

## 2016-07-06 DIAGNOSIS — F419 Anxiety disorder, unspecified: Secondary | ICD-10-CM | POA: Diagnosis not present

## 2016-07-06 DIAGNOSIS — I480 Paroxysmal atrial fibrillation: Secondary | ICD-10-CM | POA: Diagnosis not present

## 2016-07-06 DIAGNOSIS — I2510 Atherosclerotic heart disease of native coronary artery without angina pectoris: Secondary | ICD-10-CM | POA: Diagnosis not present

## 2016-07-06 DIAGNOSIS — E784 Other hyperlipidemia: Secondary | ICD-10-CM | POA: Diagnosis not present

## 2016-07-06 DIAGNOSIS — F329 Major depressive disorder, single episode, unspecified: Secondary | ICD-10-CM | POA: Diagnosis not present

## 2016-07-07 DIAGNOSIS — F419 Anxiety disorder, unspecified: Secondary | ICD-10-CM | POA: Diagnosis not present

## 2016-07-07 DIAGNOSIS — J439 Emphysema, unspecified: Secondary | ICD-10-CM | POA: Diagnosis not present

## 2016-07-07 DIAGNOSIS — I69393 Ataxia following cerebral infarction: Secondary | ICD-10-CM | POA: Diagnosis not present

## 2016-07-07 DIAGNOSIS — I10 Essential (primary) hypertension: Secondary | ICD-10-CM | POA: Diagnosis not present

## 2016-07-07 DIAGNOSIS — M1991 Primary osteoarthritis, unspecified site: Secondary | ICD-10-CM | POA: Diagnosis not present

## 2016-07-07 DIAGNOSIS — I480 Paroxysmal atrial fibrillation: Secondary | ICD-10-CM | POA: Diagnosis not present

## 2016-08-04 DIAGNOSIS — I638 Other cerebral infarction: Secondary | ICD-10-CM | POA: Diagnosis not present

## 2016-08-04 DIAGNOSIS — I480 Paroxysmal atrial fibrillation: Secondary | ICD-10-CM | POA: Diagnosis not present

## 2016-08-04 DIAGNOSIS — Z7901 Long term (current) use of anticoagulants: Secondary | ICD-10-CM | POA: Diagnosis not present

## 2016-08-04 DIAGNOSIS — I2510 Atherosclerotic heart disease of native coronary artery without angina pectoris: Secondary | ICD-10-CM | POA: Diagnosis not present

## 2016-08-04 DIAGNOSIS — E784 Other hyperlipidemia: Secondary | ICD-10-CM | POA: Diagnosis not present

## 2016-08-04 DIAGNOSIS — I10 Essential (primary) hypertension: Secondary | ICD-10-CM | POA: Diagnosis not present

## 2016-09-02 DIAGNOSIS — Z7901 Long term (current) use of anticoagulants: Secondary | ICD-10-CM | POA: Diagnosis not present

## 2016-10-01 DIAGNOSIS — Z7901 Long term (current) use of anticoagulants: Secondary | ICD-10-CM | POA: Diagnosis not present

## 2016-11-02 DIAGNOSIS — E784 Other hyperlipidemia: Secondary | ICD-10-CM | POA: Diagnosis not present

## 2016-11-02 DIAGNOSIS — I10 Essential (primary) hypertension: Secondary | ICD-10-CM | POA: Diagnosis not present

## 2016-11-02 DIAGNOSIS — F329 Major depressive disorder, single episode, unspecified: Secondary | ICD-10-CM | POA: Diagnosis not present

## 2016-11-02 DIAGNOSIS — Z0000 Encounter for general adult medical examination without abnormal findings: Secondary | ICD-10-CM | POA: Diagnosis not present

## 2016-11-02 DIAGNOSIS — Z7901 Long term (current) use of anticoagulants: Secondary | ICD-10-CM | POA: Diagnosis not present

## 2016-11-02 DIAGNOSIS — I480 Paroxysmal atrial fibrillation: Secondary | ICD-10-CM | POA: Diagnosis not present

## 2016-11-02 DIAGNOSIS — F419 Anxiety disorder, unspecified: Secondary | ICD-10-CM | POA: Diagnosis not present

## 2016-11-02 DIAGNOSIS — I639 Cerebral infarction, unspecified: Secondary | ICD-10-CM | POA: Diagnosis not present

## 2016-11-02 DIAGNOSIS — I2510 Atherosclerotic heart disease of native coronary artery without angina pectoris: Secondary | ICD-10-CM | POA: Diagnosis not present

## 2016-11-09 DIAGNOSIS — Z0000 Encounter for general adult medical examination without abnormal findings: Secondary | ICD-10-CM | POA: Diagnosis not present

## 2016-11-09 DIAGNOSIS — Z8673 Personal history of transient ischemic attack (TIA), and cerebral infarction without residual deficits: Secondary | ICD-10-CM | POA: Diagnosis not present

## 2016-11-09 DIAGNOSIS — I639 Cerebral infarction, unspecified: Secondary | ICD-10-CM | POA: Diagnosis not present

## 2016-11-09 DIAGNOSIS — I10 Essential (primary) hypertension: Secondary | ICD-10-CM | POA: Diagnosis not present

## 2016-11-09 DIAGNOSIS — Z7901 Long term (current) use of anticoagulants: Secondary | ICD-10-CM | POA: Diagnosis not present

## 2016-11-09 DIAGNOSIS — I2510 Atherosclerotic heart disease of native coronary artery without angina pectoris: Secondary | ICD-10-CM | POA: Diagnosis not present

## 2016-11-09 DIAGNOSIS — F419 Anxiety disorder, unspecified: Secondary | ICD-10-CM | POA: Diagnosis not present

## 2016-11-09 DIAGNOSIS — M159 Polyosteoarthritis, unspecified: Secondary | ICD-10-CM | POA: Diagnosis not present

## 2016-11-09 DIAGNOSIS — F329 Major depressive disorder, single episode, unspecified: Secondary | ICD-10-CM | POA: Diagnosis not present

## 2016-11-09 DIAGNOSIS — I480 Paroxysmal atrial fibrillation: Secondary | ICD-10-CM | POA: Diagnosis not present

## 2016-11-09 DIAGNOSIS — E784 Other hyperlipidemia: Secondary | ICD-10-CM | POA: Diagnosis not present

## 2016-11-16 DIAGNOSIS — I10 Essential (primary) hypertension: Secondary | ICD-10-CM | POA: Diagnosis not present

## 2016-11-16 DIAGNOSIS — I2510 Atherosclerotic heart disease of native coronary artery without angina pectoris: Secondary | ICD-10-CM | POA: Diagnosis not present

## 2016-11-16 DIAGNOSIS — I480 Paroxysmal atrial fibrillation: Secondary | ICD-10-CM | POA: Diagnosis not present

## 2016-11-16 DIAGNOSIS — E784 Other hyperlipidemia: Secondary | ICD-10-CM | POA: Diagnosis not present

## 2016-12-01 DIAGNOSIS — Z7901 Long term (current) use of anticoagulants: Secondary | ICD-10-CM | POA: Diagnosis not present

## 2017-01-03 DIAGNOSIS — Z7901 Long term (current) use of anticoagulants: Secondary | ICD-10-CM | POA: Diagnosis not present

## 2017-01-31 DIAGNOSIS — Z7901 Long term (current) use of anticoagulants: Secondary | ICD-10-CM | POA: Diagnosis not present

## 2017-02-15 DIAGNOSIS — R791 Abnormal coagulation profile: Secondary | ICD-10-CM | POA: Diagnosis not present

## 2017-03-02 DIAGNOSIS — R791 Abnormal coagulation profile: Secondary | ICD-10-CM | POA: Diagnosis not present

## 2017-03-31 DIAGNOSIS — Z23 Encounter for immunization: Secondary | ICD-10-CM | POA: Diagnosis not present

## 2017-03-31 DIAGNOSIS — F419 Anxiety disorder, unspecified: Secondary | ICD-10-CM | POA: Diagnosis not present

## 2017-03-31 DIAGNOSIS — I482 Chronic atrial fibrillation: Secondary | ICD-10-CM | POA: Diagnosis not present

## 2017-03-31 DIAGNOSIS — Z0000 Encounter for general adult medical examination without abnormal findings: Secondary | ICD-10-CM | POA: Diagnosis not present

## 2017-03-31 DIAGNOSIS — I10 Essential (primary) hypertension: Secondary | ICD-10-CM | POA: Diagnosis not present

## 2017-03-31 DIAGNOSIS — R350 Frequency of micturition: Secondary | ICD-10-CM | POA: Diagnosis not present

## 2017-04-28 DIAGNOSIS — Z7901 Long term (current) use of anticoagulants: Secondary | ICD-10-CM | POA: Diagnosis not present

## 2017-06-02 DIAGNOSIS — Z7901 Long term (current) use of anticoagulants: Secondary | ICD-10-CM | POA: Diagnosis not present

## 2017-06-30 DIAGNOSIS — Z7901 Long term (current) use of anticoagulants: Secondary | ICD-10-CM | POA: Diagnosis not present

## 2017-07-22 DIAGNOSIS — Z0000 Encounter for general adult medical examination without abnormal findings: Secondary | ICD-10-CM | POA: Diagnosis not present

## 2017-07-22 DIAGNOSIS — I10 Essential (primary) hypertension: Secondary | ICD-10-CM | POA: Diagnosis not present

## 2017-07-22 DIAGNOSIS — Z7901 Long term (current) use of anticoagulants: Secondary | ICD-10-CM | POA: Diagnosis not present

## 2017-07-22 DIAGNOSIS — I482 Chronic atrial fibrillation: Secondary | ICD-10-CM | POA: Diagnosis not present

## 2017-07-22 DIAGNOSIS — F419 Anxiety disorder, unspecified: Secondary | ICD-10-CM | POA: Diagnosis not present

## 2017-07-22 DIAGNOSIS — R350 Frequency of micturition: Secondary | ICD-10-CM | POA: Diagnosis not present

## 2017-07-29 DIAGNOSIS — I2510 Atherosclerotic heart disease of native coronary artery without angina pectoris: Secondary | ICD-10-CM | POA: Diagnosis not present

## 2017-07-29 DIAGNOSIS — Z0000 Encounter for general adult medical examination without abnormal findings: Secondary | ICD-10-CM | POA: Diagnosis not present

## 2017-07-29 DIAGNOSIS — M159 Polyosteoarthritis, unspecified: Secondary | ICD-10-CM | POA: Diagnosis not present

## 2017-07-29 DIAGNOSIS — I10 Essential (primary) hypertension: Secondary | ICD-10-CM | POA: Diagnosis not present

## 2017-07-29 DIAGNOSIS — E7849 Other hyperlipidemia: Secondary | ICD-10-CM | POA: Diagnosis not present

## 2017-07-29 DIAGNOSIS — F419 Anxiety disorder, unspecified: Secondary | ICD-10-CM | POA: Diagnosis not present

## 2017-07-29 DIAGNOSIS — I480 Paroxysmal atrial fibrillation: Secondary | ICD-10-CM | POA: Diagnosis not present

## 2017-08-22 DIAGNOSIS — Z7901 Long term (current) use of anticoagulants: Secondary | ICD-10-CM | POA: Diagnosis not present

## 2017-09-20 DIAGNOSIS — R791 Abnormal coagulation profile: Secondary | ICD-10-CM | POA: Diagnosis not present

## 2017-10-18 DIAGNOSIS — Z7901 Long term (current) use of anticoagulants: Secondary | ICD-10-CM | POA: Diagnosis not present

## 2017-11-10 DIAGNOSIS — E7849 Other hyperlipidemia: Secondary | ICD-10-CM | POA: Diagnosis not present

## 2017-11-10 DIAGNOSIS — I6389 Other cerebral infarction: Secondary | ICD-10-CM | POA: Diagnosis not present

## 2017-11-10 DIAGNOSIS — I639 Cerebral infarction, unspecified: Secondary | ICD-10-CM | POA: Diagnosis not present

## 2017-11-10 DIAGNOSIS — I159 Secondary hypertension, unspecified: Secondary | ICD-10-CM | POA: Diagnosis not present

## 2017-11-10 DIAGNOSIS — I2510 Atherosclerotic heart disease of native coronary artery without angina pectoris: Secondary | ICD-10-CM | POA: Diagnosis not present

## 2017-11-10 DIAGNOSIS — I480 Paroxysmal atrial fibrillation: Secondary | ICD-10-CM | POA: Diagnosis not present

## 2017-11-17 DIAGNOSIS — Z7901 Long term (current) use of anticoagulants: Secondary | ICD-10-CM | POA: Diagnosis not present

## 2017-11-29 DIAGNOSIS — I10 Essential (primary) hypertension: Secondary | ICD-10-CM | POA: Diagnosis not present

## 2017-11-29 DIAGNOSIS — E7849 Other hyperlipidemia: Secondary | ICD-10-CM | POA: Diagnosis not present

## 2017-11-29 DIAGNOSIS — I480 Paroxysmal atrial fibrillation: Secondary | ICD-10-CM | POA: Diagnosis not present

## 2017-11-29 DIAGNOSIS — F419 Anxiety disorder, unspecified: Secondary | ICD-10-CM | POA: Diagnosis not present

## 2017-11-29 DIAGNOSIS — I2510 Atherosclerotic heart disease of native coronary artery without angina pectoris: Secondary | ICD-10-CM | POA: Diagnosis not present

## 2017-11-29 DIAGNOSIS — M159 Polyosteoarthritis, unspecified: Secondary | ICD-10-CM | POA: Diagnosis not present

## 2017-12-06 DIAGNOSIS — I10 Essential (primary) hypertension: Secondary | ICD-10-CM | POA: Diagnosis not present

## 2017-12-06 DIAGNOSIS — F329 Major depressive disorder, single episode, unspecified: Secondary | ICD-10-CM | POA: Diagnosis not present

## 2017-12-06 DIAGNOSIS — F419 Anxiety disorder, unspecified: Secondary | ICD-10-CM | POA: Diagnosis not present

## 2017-12-06 DIAGNOSIS — Z8673 Personal history of transient ischemic attack (TIA), and cerebral infarction without residual deficits: Secondary | ICD-10-CM | POA: Diagnosis not present

## 2017-12-06 DIAGNOSIS — E7849 Other hyperlipidemia: Secondary | ICD-10-CM | POA: Diagnosis not present

## 2017-12-06 DIAGNOSIS — I2510 Atherosclerotic heart disease of native coronary artery without angina pectoris: Secondary | ICD-10-CM | POA: Diagnosis not present

## 2017-12-06 DIAGNOSIS — M159 Polyosteoarthritis, unspecified: Secondary | ICD-10-CM | POA: Diagnosis not present

## 2017-12-06 DIAGNOSIS — R3129 Other microscopic hematuria: Secondary | ICD-10-CM | POA: Diagnosis not present

## 2017-12-15 DIAGNOSIS — Z7901 Long term (current) use of anticoagulants: Secondary | ICD-10-CM | POA: Diagnosis not present

## 2017-12-29 DIAGNOSIS — I480 Paroxysmal atrial fibrillation: Secondary | ICD-10-CM | POA: Diagnosis not present

## 2017-12-29 DIAGNOSIS — R791 Abnormal coagulation profile: Secondary | ICD-10-CM | POA: Diagnosis not present

## 2017-12-29 DIAGNOSIS — I639 Cerebral infarction, unspecified: Secondary | ICD-10-CM | POA: Diagnosis not present

## 2018-01-13 ENCOUNTER — Encounter: Payer: Self-pay | Admitting: Specialist

## 2018-01-13 ENCOUNTER — Other Ambulatory Visit: Payer: Self-pay

## 2018-01-13 ENCOUNTER — Observation Stay
Admission: EM | Admit: 2018-01-13 | Discharge: 2018-01-15 | Disposition: A | Discharge status: Home / Self Care | Payer: Medicare HMO | Attending: Family Medicine | Admitting: Family Medicine

## 2018-01-13 ENCOUNTER — Emergency Department: Payer: Medicare HMO

## 2018-01-13 DIAGNOSIS — Z7901 Long term (current) use of anticoagulants: Secondary | ICD-10-CM | POA: Diagnosis not present

## 2018-01-13 DIAGNOSIS — E785 Hyperlipidemia, unspecified: Secondary | ICD-10-CM | POA: Diagnosis not present

## 2018-01-13 DIAGNOSIS — Z23 Encounter for immunization: Secondary | ICD-10-CM | POA: Insufficient documentation

## 2018-01-13 DIAGNOSIS — R51 Headache: Secondary | ICD-10-CM | POA: Insufficient documentation

## 2018-01-13 DIAGNOSIS — N390 Urinary tract infection, site not specified: Secondary | ICD-10-CM

## 2018-01-13 DIAGNOSIS — R4182 Altered mental status, unspecified: Secondary | ICD-10-CM | POA: Diagnosis present

## 2018-01-13 DIAGNOSIS — Z8673 Personal history of transient ischemic attack (TIA), and cerebral infarction without residual deficits: Secondary | ICD-10-CM | POA: Insufficient documentation

## 2018-01-13 DIAGNOSIS — I10 Essential (primary) hypertension: Secondary | ICD-10-CM | POA: Diagnosis not present

## 2018-01-13 DIAGNOSIS — R8290 Unspecified abnormal findings in urine: Secondary | ICD-10-CM | POA: Diagnosis not present

## 2018-01-13 DIAGNOSIS — I480 Paroxysmal atrial fibrillation: Secondary | ICD-10-CM | POA: Insufficient documentation

## 2018-01-13 DIAGNOSIS — G92 Toxic encephalopathy: Principal | ICD-10-CM | POA: Insufficient documentation

## 2018-01-13 DIAGNOSIS — F23 Brief psychotic disorder: Secondary | ICD-10-CM | POA: Diagnosis not present

## 2018-01-13 HISTORY — DX: Unspecified atrial fibrillation: I48.91

## 2018-01-13 LAB — COMPREHENSIVE METABOLIC PANEL
ALBUMIN: 3.9 g/dL (ref 3.5–5.0)
ALT: 14 U/L (ref 0–44)
ANION GAP: 9 (ref 5–15)
AST: 23 U/L (ref 15–41)
Alkaline Phosphatase: 73 U/L (ref 38–126)
BUN: 22 mg/dL (ref 8–23)
CALCIUM: 9.9 mg/dL (ref 8.9–10.3)
CHLORIDE: 111 mmol/L (ref 98–111)
CO2: 24 mmol/L (ref 22–32)
Creatinine, Ser: 1.08 mg/dL — ABNORMAL HIGH (ref 0.44–1.00)
GFR calc non Af Amer: 46 mL/min — ABNORMAL LOW (ref >60)
GFR, EST AFRICAN AMERICAN: 53 mL/min — AB (ref >60)
GLUCOSE: 103 mg/dL — AB (ref 70–99)
POTASSIUM: 3.5 mmol/L (ref 3.5–5.1)
SODIUM: 144 mmol/L (ref 135–145)
Total Bilirubin: 0.7 mg/dL (ref 0.3–1.2)
Total Protein: 8.2 g/dL — ABNORMAL HIGH (ref 6.5–8.1)

## 2018-01-13 LAB — URINALYSIS, COMPLETE (UACMP) WITH MICROSCOPIC
Bilirubin Urine: NEGATIVE
GLUCOSE, UA: NEGATIVE mg/dL
Ketones, ur: NEGATIVE mg/dL
LEUKOCYTES UA: NEGATIVE
NITRITE: NEGATIVE
Protein, ur: NEGATIVE mg/dL
SPECIFIC GRAVITY, URINE: 1.012 (ref 1.005–1.030)
pH: 6 (ref 5.0–8.0)

## 2018-01-13 LAB — CBC
HCT: 48.1 % — ABNORMAL HIGH (ref 36.0–46.0)
Hemoglobin: 14.9 g/dL (ref 12.0–15.0)
MCH: 26.3 pg (ref 26.0–34.0)
MCHC: 31 g/dL (ref 30.0–36.0)
MCV: 84.8 fL (ref 80.0–100.0)
Platelets: 288 10*3/uL (ref 150–400)
RBC: 5.67 MIL/uL — ABNORMAL HIGH (ref 3.87–5.11)
RDW: 17.5 % — ABNORMAL HIGH (ref 11.5–15.5)
WBC: 7.8 10*3/uL (ref 4.0–10.5)
nRBC: 0 % (ref 0.0–0.2)

## 2018-01-13 LAB — URINE DRUG SCREEN, QUALITATIVE (ARMC ONLY)
AMPHETAMINES, UR SCREEN: NOT DETECTED
BENZODIAZEPINE, UR SCRN: POSITIVE — AB
Barbiturates, Ur Screen: NOT DETECTED
CANNABINOID 50 NG, UR ~~LOC~~: NOT DETECTED
Cocaine Metabolite,Ur ~~LOC~~: NOT DETECTED
MDMA (Ecstasy)Ur Screen: NOT DETECTED
Methadone Scn, Ur: NOT DETECTED
Opiate, Ur Screen: NOT DETECTED
PHENCYCLIDINE (PCP) UR S: NOT DETECTED
TRICYCLIC, UR SCREEN: NOT DETECTED

## 2018-01-13 LAB — PROTIME-INR
INR: 2.1
PROTHROMBIN TIME: 23.3 s — AB (ref 11.4–15.2)

## 2018-01-13 LAB — TROPONIN I: TROPONIN I: 0.03 ng/mL — AB (ref <0.03)

## 2018-01-13 LAB — GLUCOSE, CAPILLARY: GLUCOSE-CAPILLARY: 90 mg/dL (ref 70–99)

## 2018-01-13 MED ORDER — SODIUM CHLORIDE 0.9 % IV SOLN
1.0000 g | Freq: Once | INTRAVENOUS | Status: DC
  Filled 2018-01-13: qty 10

## 2018-01-13 MED ORDER — WARFARIN SODIUM 2.5 MG PO TABS
2.5000 mg | ORAL_TABLET | Freq: Every day | ORAL | Status: DC
  Administered 2018-01-13 – 2018-01-14 (×2): 2.5 mg via ORAL
  Filled 2018-01-13 (×3): qty 1

## 2018-01-13 MED ORDER — LOSARTAN POTASSIUM 50 MG PO TABS
100.0000 mg | ORAL_TABLET | Freq: Every day | ORAL | Status: DC
  Administered 2018-01-13 – 2018-01-15 (×2): 100 mg via ORAL
  Filled 2018-01-13 (×3): qty 2

## 2018-01-13 MED ORDER — ONDANSETRON HCL 4 MG PO TABS: 4.0000 mg | ORAL_TABLET | Freq: Four times a day (QID) | ORAL | Status: DC | PRN

## 2018-01-13 MED ORDER — AMLODIPINE BESYLATE 10 MG PO TABS
10.0000 mg | ORAL_TABLET | Freq: Every day | ORAL | Status: DC
  Administered 2018-01-13 – 2018-01-15 (×2): 10 mg via ORAL
  Filled 2018-01-13 (×3): qty 1

## 2018-01-13 MED ORDER — SODIUM CHLORIDE 0.9 % IV SOLN
1.0000 g | INTRAVENOUS | Status: DC
  Administered 2018-01-14: 1 g via INTRAVENOUS
  Filled 2018-01-13: qty 1
  Filled 2018-01-13: qty 10

## 2018-01-13 MED ORDER — ENOXAPARIN SODIUM 40 MG/0.4ML ~~LOC~~ SOLN: 40.0000 mg | SUBCUTANEOUS | Status: DC

## 2018-01-13 MED ORDER — CARVEDILOL 25 MG PO TABS
25.0000 mg | ORAL_TABLET | Freq: Two times a day (BID) | ORAL | Status: DC
  Administered 2018-01-13 – 2018-01-15 (×3): 25 mg via ORAL
  Filled 2018-01-13 (×4): qty 1

## 2018-01-13 MED ORDER — WARFARIN - PHYSICIAN DOSING INPATIENT
Freq: Every day | Status: DC
  Administered 2018-01-14 – 2018-01-15 (×2)

## 2018-01-13 MED ORDER — ATORVASTATIN CALCIUM 20 MG PO TABS
80.0000 mg | ORAL_TABLET | Freq: Every day | ORAL | Status: DC
  Administered 2018-01-13 – 2018-01-15 (×2): 80 mg via ORAL
  Filled 2018-01-13 (×3): qty 4

## 2018-01-13 MED ORDER — INFLUENZA VAC SPLIT HIGH-DOSE 0.5 ML IM SUSY
0.5000 mL | PREFILLED_SYRINGE | INTRAMUSCULAR | Status: AC
  Administered 2018-01-15: 0.5 mL via INTRAMUSCULAR
  Filled 2018-01-13: qty 0.5

## 2018-01-13 MED ORDER — ACETAMINOPHEN 650 MG RE SUPP: 650.0000 mg | Freq: Four times a day (QID) | RECTAL | Status: DC | PRN

## 2018-01-13 MED ORDER — ONDANSETRON HCL 4 MG/2ML IJ SOLN: 4.0000 mg | Freq: Four times a day (QID) | INTRAMUSCULAR | Status: DC | PRN

## 2018-01-13 MED ORDER — ACETAMINOPHEN 325 MG PO TABS: 650.0000 mg | ORAL_TABLET | Freq: Four times a day (QID) | ORAL | Status: DC | PRN

## 2018-01-13 NOTE — ED Notes (Signed)
Pt transported to room 102 

## 2018-01-13 NOTE — ED Notes (Signed)
Pt resting in bed, eyes closed. Much difficulty obtaining history from pt and pt spouse bedside. Pt spouse states she has been "acting silly" since this morning. States "I believe she has dementia." He has difficulty elaborating stating "from time to time she does silly stuff that doesn't make sense with the most recent episode this morning." Pt tells me her name and how many years she has been married, but does not answer any other questions stating "I dont know and I am tired." Pt in and out cath with Shawna Orleans present, pt spouse bedside verbalizes understanding.

## 2018-01-13 NOTE — ED Triage Notes (Signed)
First Nurse Note:  Arrives with [facility] from [facility] for evaluation of confusion.  Patient brought to [facility] by [facility] today voluntarily.  Patient is AAOx3.  Skin warm and dry. NAD

## 2018-01-13 NOTE — ED Triage Notes (Signed)
Pt's husband states pt called 911 this am, pt states that her son saw her this am and thought she was having a stroke, husband states that is not so, he is in [place] and spoke over the phone.  Pt told husband and police that her son had been kidnapped, pt is mostly keeping her eyes closed in triage, had previously been open for the tech, pt is now whispering about her son.

## 2018-01-13 NOTE — ED Notes (Signed)
Attempt to place IV and admin medications, Pt in CT at this time.

## 2018-01-13 NOTE — H&P (Signed)
[employer] [facility] [place] [facility]    PATIENT NAME: Cassandra Weiss    MR#:  [id number]  DATE OF BIRTH:  18-Mar-1933  DATE OF ADMISSION:  01/13/2018  PRIMARY CARE PHYSICIAN: Barbette Reichmann, MD   REQUESTING/REFERRING PHYSICIAN: Dr. Gladstone Pih  CHIEF COMPLAINT:   Chief Complaint  Patient presents with  . Altered Mental Status    HISTORY OF PRESENT ILLNESS:  Cassandra Weiss  is a 84 y.o. female with a known history of essential hypertension, paroxysmal atrial fibrillation, history of previous CVA, osteoarthritis who presents to the hospital due to altered mental status.  Patient husband is at bedside and also cannot provide a good history, but as per the husband patient apparently was confused and not her baseline therefore was brought to the hospital.  Patient apparently called the police earlier and was having paranoid thoughts about her son and not acting like herself and therefore was brought to the ER.  In the ER patient is alert awake and oriented to time place and person but still continues to have periods of confusion.  Patient denies any dysuria, hematuria, fever chills cough congestion or any other associated symptoms.  She denies any recent changes in her medications.  Patient's urinalysis was not completely positive for UTI but had many bacteria but given her progressive altered mental status and patient having no previous history of dementia or psychiatric illness hospice services were contacted for admission.  PAST MEDICAL HISTORY:   Past Medical History:  Diagnosis Date  . Arthritis   . Atrial fibrillation (HCC)   . Hypertension     PAST SURGICAL HISTORY:  No past surgical history on file.  SOCIAL HISTORY:   Social History   Tobacco Use  . Smoking status: Never Smoker  Substance Use Topics  . Alcohol use: No    FAMILY HISTORY:   Family History  Problem Relation Age of Onset  . Heart disease Mother   . Stroke Mother   . Heart disease  Father     DRUG ALLERGIES:   Allergies  Allergen Reactions  . Penicillins Rash    Has patient had a PCN reaction causing immediate rash, facial/tongue/throat swelling, SOB or lightheadedness with hypotension: Unknown Has patient had a PCN reaction causing severe rash involving mucus membranes or skin necrosis: Unknown Has patient had a PCN reaction that required hospitalization: Unknown Has patient had a PCN reaction occurring within the last 10 years: Unknown If all of the above answers are "NO", then may proceed with Cephalosporin use.     REVIEW OF SYSTEMS:   Review of Systems  Unable to perform ROS: Mental acuity    MEDICATIONS AT HOME:   Prior to Admission medications   Medication Sig Start Date End Date Taking? Authorizing Provider  ALPRAZolam Prudy Feeler) 0.5 MG tablet Take 0.5 mg by mouth at bedtime as needed for sleep. 12/26/17  Yes [provider]  amLODipine (NORVASC) 10 MG tablet Take 10 mg by mouth daily. 12/26/17  Yes [provider]  atorvastatin (LIPITOR) 80 MG tablet Take 80 mg by mouth daily. 12/14/17  Yes [provider]  carvedilol (COREG) 25 MG tablet Take 25 mg by mouth 2 (two) times daily. 01/05/18  Yes [provider]  losartan (COZAAR) 100 MG tablet Take 100 mg by mouth daily. 12/12/17  Yes [provider]  warfarin (COUMADIN) 2.5 MG tablet Take 2.5 mg by mouth daily. 12/30/17  Yes [provider]      VITAL SIGNS:  Blood  pressure (!) 142/72, pulse 71, temperature 98.1 F (36.7 C), temperature source Oral, height 5\' 3"  (1.6 m), weight 72.6 kg, SpO2 97 %.  PHYSICAL EXAMINATION:  Physical Exam  GENERAL:  84 y.o.-year-old patient lying in the bed in no acute distress.  EYES: Pupils equal, round, reactive to light and accommodation. No scleral icterus. Extraocular muscles intact.  HEENT: Head atraumatic, normocephalic. Oropharynx and nasopharynx clear. No oropharyngeal erythema, moist oral mucosa  NECK:   Supple, no jugular venous distention. No thyroid enlargement, no tenderness.  LUNGS: Normal breath sounds bilaterally, no wheezing, rales, rhonchi. No use of accessory muscles of respiration.  CARDIOVASCULAR: S1, S2 RRR. No murmurs, rubs, gallops, clicks.  ABDOMEN: Soft, nontender, nondistended. Bowel sounds present. No organomegaly or mass.  EXTREMITIES: No pedal edema, cyanosis, or clubbing. + 2 pedal & radial pulses b/l.   NEUROLOGIC: Cranial nerves II through XII are intact. No focal Motor or sensory deficits appreciated b/l PSYCHIATRIC: The patient is alert and oriented x 3. SKIN: No obvious rash, lesion, or ulcer.   LABORATORY PANEL:   CBC Recent Labs  Lab 01/13/18 1529  WBC 7.8  HGB 14.9  HCT 48.1*  PLT 288   ------------------------------------------------------------------------------------------------------------------  Chemistries  Recent Labs  Lab 01/13/18 1529  NA 144  K 3.5  CL 111  CO2 24  GLUCOSE 103*  BUN 22  CREATININE 1.08*  CALCIUM 9.9  AST 23  ALT 14  ALKPHOS 73  BILITOT 0.7   ------------------------------------------------------------------------------------------------------------------  Cardiac Enzymes Recent Labs  Lab 01/13/18 1529  TROPONINI 0.03*   ------------------------------------------------------------------------------------------------------------------  RADIOLOGY:  Dg Chest 2 View  Result Date: 01/13/2018 CLINICAL DATA:  Altered mental status EXAM: CHEST - 2 VIEW COMPARISON:  02/14/2015 FINDINGS: Mild cardiomegaly. No focal consolidation or significant effusion. No pneumothorax. IMPRESSION: No active cardiopulmonary disease.  Mild cardiomegaly. Electronically Signed   By: Jasmine Pang M.D.   On: 01/13/2018 19:15   Ct Head Wo Contrast  Result Date: 01/13/2018 CLINICAL DATA:  AMS. Arrives with [facility] from [facility] for evaluation of confusion. Patient brought to [facility] by [facility] today voluntarily. EXAM: CT HEAD WITHOUT CONTRAST TECHNIQUE:  Contiguous axial images were obtained from the base of the skull through the vertex without intravenous contrast. COMPARISON:  CT of the head on 02/14/2015 FINDINGS: Brain: There is mild central and cortical atrophy. Periventricular white matter changes are consistent with small vessel disease. There is no intra or extra-axial fluid collection or mass lesion. The basilar cisterns and ventricles have a normal appearance. There is no CT evidence for acute infarction or hemorrhage. Vascular: There is atherosclerotic calcification of the internal carotid arteries. Skull: Normal. Negative for fracture or focal lesion. Sinuses/Orbits: No acute finding. Other: None. IMPRESSION: 1. Mild atrophy and small vessel disease. 2.  No evidence for acute intracranial abnormality. Electronically Signed   By: Norva Pavlov M.D.   On: 01/13/2018 19:02     IMPRESSION AND PLAN:   84 year old female with past medical history of paroxysmal atrial fibrillation, hypertension, osteoarthritis who presents to the hospital due to altered mental status.  1.  Altered mental status-etiology unclear presently.  Patient CT head is negative. - No acute metabolic source, urine drug screen is pending, urinalysis shows many bacteria but nitrites, leukocyte esterase and white cells are not present to suggest UTI. - We will empirically treat the patient with IV ceftriaxone for now, follow mental status.  Patient has no previous history of dementia or psychiatric illness.  The patient is not improving with supportive therapy would  consider getting a psychiatric evaluation.  2.  History of paroxysmal atrial fibrillation-this is rate controlled. -Continue carvedilol. -Continue Coumadin, INR therapeutic.  3.  Hyperlipidemia-continue atorvastatin.  4.  Essential hypertension-continue Norvasc, carvedilol, losartan.  I will also get a physical therapy evaluation and also care management consult for home health needs.    All the records  are reviewed and case discussed with ED provider. Management plans discussed with the patient, family and they are in agreement.  CODE STATUS: Full code  TOTAL TIME TAKING CARE OF THIS PATIENT: 45 minutes.    Houston Siren M.D on 01/13/2018 at 7:22 PM  Between 7am to 6pm - Pager - [phone]  After 6pm go to [email or web address] - password [email or web address] [facility]  [place] [facility]  Office  [phone]  CC: Primary care physician; Barbette Reichmann, MD

## 2018-01-13 NOTE — ED Provider Notes (Signed)
[facility] Emergency Department Provider Note  ____________________________________________   First MD Initiated Contact with Patient 01/13/18 1626     (approximate)  I have reviewed the triage vital signs and the nursing notes.   HISTORY  Chief Complaint Altered Mental Status   HPI Cassandra Weiss is a 84 y.o. female with a history of arthritis and hypertension was presented to the emergency department today with 1 week of worsening agitation.  Patient's husband is at the bedside and says that the patient called the police this morning without provocation.  Patient denying any nausea, vomiting or diarrhea.  Says that she has frequent headaches and has a headache currently to the right side of her head which is a 9 out of 10 but says that it may be because she has not eaten anything today.  Says that she does get similar headaches when she does not eat.  Denies any weakness or numbness.  Patient's husband says that she has not had a similar orientation in the past.  Patient does take Coumadin for previous stroke.   Past Medical History:  Diagnosis Date  . Arthritis   . Hypertension     There are no active problems to display for this patient.   No past surgical history on file.  Prior to Admission medications   Medication Sig Start Date End Date Taking? Authorizing Provider  ALPRAZolam Prudy Feeler) 0.5 MG tablet Take 0.5 mg by mouth at bedtime as needed for sleep. 12/26/17  Yes [provider]  amLODipine (NORVASC) 10 MG tablet Take 10 mg by mouth daily. 12/26/17  Yes [provider]  atorvastatin (LIPITOR) 80 MG tablet Take 80 mg by mouth daily. 12/14/17  Yes [provider]  carvedilol (COREG) 25 MG tablet Take 25 mg by mouth 2 (two) times daily. 01/05/18  Yes [provider]  losartan (COZAAR) 100 MG tablet Take 100 mg by mouth daily. 12/12/17  Yes [provider]  warfarin (COUMADIN) 2.5 MG tablet Take 2.5 mg by  mouth daily. 12/30/17  Yes [provider]    Allergies Penicillins  No family history on file.  Social History Social History   Tobacco Use  . Smoking status: Never Smoker  Substance Use Topics  . Alcohol use: No  . Drug use: Not on file    Review of Systems  Constitutional: No fever/chills Eyes: No visual changes. ENT: No sore throat. Cardiovascular: Denies chest pain. Respiratory: Denies shortness of breath. Gastrointestinal: No abdominal pain.  No nausea, no vomiting.  No diarrhea.  No constipation. Genitourinary: Negative for dysuria. Musculoskeletal: Negative for back pain. Skin: Negative for rash. Neurological: Negative for headaches, focal weakness or numbness.   ____________________________________________   PHYSICAL EXAM:  VITAL SIGNS: ED Triage Vitals  Enc Vitals Group     BP 01/13/18 1515 (!) 142/72     Pulse Rate 01/13/18 1515 71     Resp --      Temp 01/13/18 1515 98.1 F (36.7 C)     Temp Source 01/13/18 1515 Oral     SpO2 01/13/18 1515 97 %     Weight 01/13/18 1516 160 lb (72.6 kg)     Height 01/13/18 1516 5\' 3"  (1.6 m)     Head Circumference --      Peak Flow --      Pain Score 01/13/18 1546 0     Pain Loc --      Pain Edu? --      Excl.  in GC? --     Constitutional: Alert and oriented to self as well as year and birthdate but not to location. Well appearing and in no acute distress. Eyes: Conjunctivae are normal.  Head: Atraumatic. Nose: No congestion/rhinnorhea. Mouth/Throat: Mucous membranes are moist.  Neck: No stridor.   Cardiovascular: Normal rate, regular rhythm. Grossly normal heart sounds.   Respiratory: Normal respiratory effort.  No retractions. Lungs CTAB. Gastrointestinal: Soft and nontender. No distention. No CVA tenderness. Musculoskeletal: No lower extremity tenderness nor edema.  No joint effusions. Neurologic:  Normal speech and language. No gross focal neurologic deficits are appreciated. Skin:  Skin is  warm, dry and intact. No rash noted. Psychiatric: Mood and affect are normal. Speech and behavior are normal.  ____________________________________________   LABS (all labs ordered are listed, but only abnormal results are displayed)  Labs Reviewed  COMPREHENSIVE METABOLIC PANEL - Abnormal; Notable for the following components:      Result Value   Glucose, Bld 103 (*)    Creatinine, Ser 1.08 (*)    Total Protein 8.2 (*)    GFR calc non Af Amer 46 (*)    GFR calc Af Amer 53 (*)    All other components within normal limits  CBC - Abnormal; Notable for the following components:   RBC 5.67 (*)    HCT 48.1 (*)    RDW 17.5 (*)    All other components within normal limits  URINALYSIS, COMPLETE (UACMP) WITH MICROSCOPIC - Abnormal; Notable for the following components:   Color, Urine YELLOW (*)    APPearance HAZY (*)    Hgb urine dipstick MODERATE (*)    Bacteria, UA MANY (*)    All other components within normal limits  TROPONIN I - Abnormal; Notable for the following components:   Troponin I 0.03 (*)    All other components within normal limits  URINE CULTURE  GLUCOSE, CAPILLARY  URINE DRUG SCREEN, QUALITATIVE ([facility] ONLY)  PROTIME-INR   ____________________________________________  EKG  ED ECG REPORT I, Arelia Longest, the attending physician, personally viewed and interpreted this ECG.   Date: 01/13/2018  EKG Time: 1526  Rate: 72  Rhythm: normal sinus rhythm with occasional PVCs  Axis: Normal  Intervals:none  ST&T Change: No ST segment depression.  Single ST elevation in V2.  No reciprocal depression or inversion.  Single inversion and V6.  ____________________________________________  RADIOLOGY  CT head without acute finding ____________________________________________   PROCEDURES  Procedure(s) performed:   Procedures  Critical Care performed:   ____________________________________________   INITIAL IMPRESSION / ASSESSMENT AND PLAN / ED  COURSE  Pertinent labs & imaging results that were available during my care of the patient were reviewed by me and considered in my medical decision making (see chart for details).  Differential diagnosis includes, but is not limited to, alcohol, illicit or prescription medications, or other toxic ingestion; intracranial pathology such as stroke or intracerebral hemorrhage; fever or infectious causes including sepsis; hypoxemia and/or hypercarbia; uremia; trauma; endocrine related disorders such as diabetes, hypoglycemia, and thyroid-related diseases; hypertensive encephalopathy; etc. As part of my medical decision making, I reviewed the following data within the electronic MEDICAL RECORD NUMBER Notes from prior ED visits  ----------------------------------------- 7:17 PM on 01/13/2018 -----------------------------------------  Patient with urine positive for many bacteria and no squamous cells.  Work-up otherwise appears reassuring initially.  Patient be admitted to the hospital for undifferentiated altered mental status.  Psychiatric diagnoses also remain on the differential.  However, the patient is not a  previous psych diagnoses so it is unclear whether this can be attributed to that at this point.  Signed out to Dr. Cherlynn Kaiser.  Patient as well as family understanding the diagnosis as well as the treatment plan willing to comply. ____________________________________________   FINAL CLINICAL IMPRESSION(S) / ED DIAGNOSES  UTI.  Altered mental status.  NEW MEDICATIONS STARTED DURING THIS VISIT:  New Prescriptions   No medications on file     Note:  This document was prepared using Dragon voice recognition software and may include unintentional dictation errors.     Myrna Blazer, MD 01/13/18 [phone]

## 2018-01-14 ENCOUNTER — Other Ambulatory Visit: Payer: Self-pay

## 2018-01-14 ENCOUNTER — Observation Stay: Payer: Medicare HMO

## 2018-01-14 DIAGNOSIS — N390 Urinary tract infection, site not specified: Secondary | ICD-10-CM | POA: Diagnosis not present

## 2018-01-14 DIAGNOSIS — I10 Essential (primary) hypertension: Secondary | ICD-10-CM | POA: Diagnosis not present

## 2018-01-14 DIAGNOSIS — I480 Paroxysmal atrial fibrillation: Secondary | ICD-10-CM | POA: Diagnosis not present

## 2018-01-14 DIAGNOSIS — R4182 Altered mental status, unspecified: Secondary | ICD-10-CM | POA: Diagnosis not present

## 2018-01-14 LAB — AMMONIA: AMMONIA: 29 umol/L (ref 9–35)

## 2018-01-14 NOTE — Progress Notes (Signed)
[employer] [facility] [place] [facility]   PATIENT NAME: Cassandra Weiss    MR#:  [id number]  DATE OF BIRTH:  July 12, 1932  SUBJECTIVE:  CHIEF COMPLAINT:   Chief Complaint  Patient presents with  . Altered Mental Status  Patient with slow mentation, mild confusion, noted global weakness, await MRI of the brain, physical therapy input appreciated  REVIEW OF SYSTEMS:  CONSTITUTIONAL: No fever, fatigue or weakness.  EYES: No blurred or double vision.  EARS, NOSE, AND THROAT: No tinnitus or ear pain.  RESPIRATORY: No cough, shortness of breath, wheezing or hemoptysis.  CARDIOVASCULAR: No chest pain, orthopnea, edema.  GASTROINTESTINAL: No nausea, vomiting, diarrhea or abdominal pain.  GENITOURINARY: No dysuria, hematuria.  ENDOCRINE: No polyuria, nocturia,  HEMATOLOGY: No anemia, easy bruising or bleeding SKIN: No rash or lesion. MUSCULOSKELETAL: No joint pain or arthritis.   NEUROLOGIC: No tingling, numbness, weakness.  PSYCHIATRY: No anxiety or depression.   ROS  DRUG ALLERGIES:   Allergies  Allergen Reactions  . Penicillins Rash    Has patient had a PCN reaction causing immediate rash, facial/tongue/throat swelling, SOB or lightheadedness with hypotension: Unknown Has patient had a PCN reaction causing severe rash involving mucus membranes or skin necrosis: Unknown Has patient had a PCN reaction that required hospitalization: Unknown Has patient had a PCN reaction occurring within the last 10 years: Unknown If all of the above answers are "NO", then may proceed with Cephalosporin use.     VITALS:  Blood pressure (!) 153/68, pulse 68, temperature 98.7 F (37.1 C), temperature source Oral, resp. rate 18, height 5\' 2"  (1.575 m), weight 86.2 kg, SpO2 97 %.  PHYSICAL EXAMINATION:  GENERAL:  84 y.o.-year-old patient lying in the bed with no acute distress.  EYES: Pupils equal, round, reactive to light and accommodation. No scleral icterus. Extraocular muscles intact.   HEENT: Head atraumatic, normocephalic. Oropharynx and nasopharynx clear.  NECK:  Supple, no jugular venous distention. No thyroid enlargement, no tenderness.  LUNGS: Normal breath sounds bilaterally, no wheezing, rales,rhonchi or crepitation. No use of accessory muscles of respiration.  CARDIOVASCULAR: S1, S2 normal. No murmurs, rubs, or gallops.  ABDOMEN: Soft, nontender, nondistended. Bowel sounds present. No organomegaly or mass.  EXTREMITIES: No pedal edema, cyanosis, or clubbing.  NEUROLOGIC: Cranial nerves II through XII are intact. Muscle strength 5/5 in all extremities. Sensation intact. Gait not checked.  PSYCHIATRIC: The patient is alert and oriented x 3.  SKIN: No obvious rash, lesion, or ulcer.   Physical Exam LABORATORY PANEL:   CBC Recent Labs  Lab 01/13/18 1529  WBC 7.8  HGB 14.9  HCT 48.1*  PLT 288   ------------------------------------------------------------------------------------------------------------------  Chemistries  Recent Labs  Lab 01/13/18 1529  NA 144  K 3.5  CL 111  CO2 24  GLUCOSE 103*  BUN 22  CREATININE 1.08*  CALCIUM 9.9  AST 23  ALT 14  ALKPHOS 73  BILITOT 0.7   ------------------------------------------------------------------------------------------------------------------  Cardiac Enzymes Recent Labs  Lab 01/13/18 1529  TROPONINI 0.03*   ------------------------------------------------------------------------------------------------------------------  RADIOLOGY:  Dg Chest 2 View  Result Date: 01/13/2018 CLINICAL DATA:  Altered mental status EXAM: CHEST - 2 VIEW COMPARISON:  02/14/2015 FINDINGS: Mild cardiomegaly. No focal consolidation or significant effusion. No pneumothorax. IMPRESSION: No active cardiopulmonary disease.  Mild cardiomegaly. Electronically Signed   By: Jasmine Pang M.D.   On: 01/13/2018 19:15   Ct Head Wo Contrast  Result Date: 01/13/2018 CLINICAL DATA:  AMS. Arrives with [facility] from [facility] for  evaluation of confusion. Patient  brought to [facility] by BPD today voluntarily. EXAM: CT HEAD WITHOUT CONTRAST TECHNIQUE: Contiguous axial images were obtained from the base of the skull through the vertex without intravenous contrast. COMPARISON:  CT of the head on 02/14/2015 FINDINGS: Brain: There is mild central and cortical atrophy. Periventricular white matter changes are consistent with small vessel disease. There is no intra or extra-axial fluid collection or mass lesion. The basilar cisterns and ventricles have a normal appearance. There is no CT evidence for acute infarction or hemorrhage. Vascular: There is atherosclerotic calcification of the internal carotid arteries. Skull: Normal. Negative for fracture or focal lesion. Sinuses/Orbits: No acute finding. Other: None. IMPRESSION: 1. Mild atrophy and small vessel disease. 2.  No evidence for acute intracranial abnormality. Electronically Signed   By: Norva Pavlov M.D.   On: 01/13/2018 19:02    ASSESSMENT AND PLAN:  84 year old female with past medical history of paroxysmal atrial fibrillation, hypertension, osteoarthritis who presents to the hospital due to altered mental status.  1.    Acute toxic metabolic encephalopathy  Exact etiology unknown  Check MRI of the brain, ammonia level, RPR, aspiration/fall precautions, physical therapy recommending skilled nursing facility placement   2.  History of paroxysmal atrial fibrillation Controlled on Coreg, Coumadin   3.  Hyperlipidemia continue atorvastatin  4.  Essential hypertension continue Norvasc, carvedilol, losartan.  5.  Acute abnormal urinalysis  Minimally abnormal  Continue empiric antibiotics and follow-up on cultures   Disposition to skilled nursing facility versus home with home health services in 1 to 2 days barring any complications  all the records are reviewed and case discussed with Care Management/Social Workerr. Management plans discussed with the patient, family  and they are in agreement.  CODE STATUS: full  TOTAL TIME TAKING CARE OF THIS PATIENT: 35 minutes.     POSSIBLE D/C IN 1-2 DAYS, DEPENDING ON CLINICAL CONDITION.   [name] M.D on 01/14/2018   Between 7am to 6pm - Pager - [phone]  After 6pm go to [email or web address] - password [email or web address]  [facility] [place] [facility]  Office  [phone]  CC: Primary care physician; Barbette Reichmann, MD  Note: This dictation was prepared with Dragon dictation along with smaller phrase technology. Any transcriptional errors that result from this process are unintentional.

## 2018-01-14 NOTE — Evaluation (Signed)
Physical Therapy Evaluation Patient Details Name: Cassandra Weiss MRN: [id number] DOB: June 13, 1932 Today's Date: 01/14/2018   History of Present Illness  84 yo female with onset of AMS and mild elevation of troponin was admitted and is still quite non verbal with no family to discuss her presentation.  UTI was not found but increased urinary bacteria, husband is not able to give history and help.    Clinical Impression  Pt was admitted with symptoms that have not fully been explained.  Her presentation is confused and non verbal and is unable to assist with any functional mobility.  Will recommend rehab as she is not going to be manageable with home care yet, and expect to continue PT acutely for work on sitting balance and transfers to the chair, as well as progression to standing with walker to increase control.      Follow Up Recommendations SNF    Equipment Recommendations  None recommended by PT    Recommendations for Other Services       Precautions / Restrictions Precautions Precautions: Fall(telemetry) Restrictions Weight Bearing Restrictions: No      Mobility  Bed Mobility Overal bed mobility: Needs Assistance Bed Mobility: Supine to Sit;Sit to Supine     Supine to sit: Max assist Sit to supine: Total assist   General bed mobility comments: total assist due to her inability to follow with PT  Transfers Overall transfer level: Needs assistance Equipment used: Rolling walker (2 wheeled);None Transfers: Sit to/from Stand Sit to Stand: Total assist         General transfer comment: Pt resisted effort to stand her and would not follow instructions to use RW  Ambulation/Gait             General Gait Details: non ambulatory at eval  Stairs            Wheelchair Mobility    Modified Rankin (Stroke Patients Only) Modified Rankin (Stroke Patients Only) Modified Rankin: Severe disability     Balance                                              Pertinent Vitals/Pain Pain Assessment: Faces Faces Pain Scale: No hurt    Home Living Family/patient expects to be discharged to:: Private residence Living Arrangements: Spouse/significant other Available Help at Discharge: Family;Available 24 hours/day(husband is confused at pt's admission) Type of Home: House Home Access: (pt cannot give details, non verbal)         Additional Comments: pt is unable to give a prior level history to PT    Prior Function Level of Independence: (no information)               Hand Dominance        Extremity/Trunk Assessment   Upper Extremity Assessment Upper Extremity Assessment: Difficult to assess due to impaired cognition    Lower Extremity Assessment Lower Extremity Assessment: Difficult to assess due to impaired cognition    Cervical / Trunk Assessment Cervical / Trunk Assessment: Normal  Communication   Communication: Expressive difficulties(non verbal)  Cognition Arousal/Alertness: Lethargic Behavior During Therapy: Flat affect Overall Cognitive Status: No family/caregiver present to determine baseline cognitive functioning                                 General  Comments: pt is staring blankly and resisting efforts to move her but no prior infrmation is available      General Comments      Exercises     Assessment/Plan    PT Assessment Patient needs continued PT services  PT Problem List Decreased strength;Decreased range of motion;Decreased activity tolerance;Decreased balance;Decreased mobility;Decreased coordination;Decreased cognition;Decreased knowledge of use of DME;Decreased safety awareness;Cardiopulmonary status limiting activity       PT Treatment Interventions DME instruction;Gait training;Functional mobility training;Therapeutic activities;Therapeutic exercise;Balance training;Neuromuscular re-education;Patient/family education    PT Goals (Current goals can be found in  the Care Plan section)  Acute Rehab PT Goals Patient Stated Goal: none stated PT Goal Formulation: Patient unable to participate in goal setting Time For Goal Achievement: 01/28/18 Potential to Achieve Goals: Fair    Frequency Min 2X/week   Barriers to discharge Decreased caregiver support(husband is confused )      Co-evaluation               AM-PAC PT "6 Clicks" Daily Activity  Outcome Measure Difficulty turning over in bed (including adjusting bedclothes, sheets and blankets)?: Unable Difficulty moving from lying on back to sitting on the side of the bed? : Unable Difficulty sitting down on and standing up from a chair with arms (e.g., wheelchair, bedside commode, etc,.)?: Unable Help needed moving to and from a bed to chair (including a wheelchair)?: Total Help needed walking in hospital room?: Total Help needed climbing 3-5 steps with a railing? : Total 6 Click Score: 6    End of Session   Activity Tolerance: Patient limited by fatigue;Treatment limited secondary to agitation;Treatment limited secondary to medical complications (Comment) Patient left: in bed;with call bell/phone within reach;with bed alarm set Nurse Communication: Mobility status PT Visit Diagnosis: Muscle weakness (generalized) (M62.81);Difficulty in walking, not elsewhere classified (R26.2);Adult, failure to thrive (R62.7)    Time: 0830-0850 PT Time Calculation (min) (ACUTE ONLY): 20 min   Charges:   PT Evaluation $PT Eval Moderate Complexity: 1 Mod         Ivar Drape 01/14/2018, 9:42 AM   Samul Dada, PT MS Acute Rehab Dept. Number: [facility] [phone] and [facility] [phone]

## 2018-01-14 NOTE — Plan of Care (Signed)

## 2018-01-15 DIAGNOSIS — I10 Essential (primary) hypertension: Secondary | ICD-10-CM | POA: Diagnosis not present

## 2018-01-15 DIAGNOSIS — R4182 Altered mental status, unspecified: Secondary | ICD-10-CM | POA: Diagnosis not present

## 2018-01-15 DIAGNOSIS — I480 Paroxysmal atrial fibrillation: Secondary | ICD-10-CM | POA: Diagnosis not present

## 2018-01-15 DIAGNOSIS — N390 Urinary tract infection, site not specified: Secondary | ICD-10-CM | POA: Diagnosis not present

## 2018-01-15 LAB — URINE CULTURE

## 2018-01-15 LAB — PROTIME-INR
INR: 2.52
Prothrombin Time: 26.8 seconds — ABNORMAL HIGH (ref 11.4–15.2)

## 2018-01-15 MED ORDER — HYDRALAZINE HCL 50 MG PO TABS
50.0000 mg | ORAL_TABLET | Freq: Three times a day (TID) | ORAL | Status: DC
  Administered 2018-01-15: 09:00:00 50 mg via ORAL

## 2018-01-15 MED ORDER — CEFDINIR 300 MG PO CAPS: 300.0000 mg | ORAL_CAPSULE | Freq: Two times a day (BID) | ORAL | 0 refills | Status: DC

## 2018-01-15 NOTE — Care Management Note (Signed)
Case Management Note  Patient Details  Name: Cassandra Weiss MRN: [id number] Date of Birth: [birth year]  Subjective/Objective:    Patient to be discharged per MD order. Orders in place for home health services. Patient currently has some fluctuations in mental status but seems to be appropriate in selecting home health. She is agreeable and has no preference in agency. I have also spoken with her spouse and he defers to the patient for the decision. Referral placed with [facility]. Spouse to provide discharge transport.                  Action/Plan:   Expected Discharge Date:  01/15/18               Expected Discharge Plan:  Home w Home Health Services  In-House Referral:     Discharge planning Services  CM Consult  Post Acute Care Choice:  Home Health Choice offered to:  Patient, Spouse  DME Arranged:    DME Agency:     HH Arranged:  RN, PT, Nurse's Aide, Social Work [employer] Agency:  [facility]  Status of Service:  Completed, signed off  If discussed at [employer], dates discussed:    Additional Comments:  Virgel Manifold, RN 01/15/2018, 3:41 PM

## 2018-01-15 NOTE — Care Management Obs Status (Signed)
MEDICARE OBSERVATION STATUS NOTIFICATION   Patient Details  Name: Cassandra Weiss MRN: [id number] Date of Birth: [birth year]   Medicare Observation Status Notification Given:  Yes    Samil Mecham A Medina Degraffenreid, RN 01/15/2018, 9:28 AM

## 2018-01-15 NOTE — Discharge Summary (Signed)
[facility] [place] at [facility]   PATIENT NAME: Cassandra Weiss    MR#:  [id number]  DATE OF BIRTH:  28-Mar-1932  DATE OF ADMISSION:  01/13/2018 ADMITTING PHYSICIAN: Houston Siren, MD  DATE OF DISCHARGE: No discharge date for patient encounter.  PRIMARY CARE PHYSICIAN: Barbette Reichmann, MD    ADMISSION DIAGNOSIS:  Urinary tract infection without hematuria, site unspecified [N39.0] Altered mental status, unspecified altered mental status type [R41.82]  DISCHARGE DIAGNOSIS:  Active Problems:   Altered mental status   SECONDARY DIAGNOSIS:   Past Medical History:  Diagnosis Date  . Arthritis   . Atrial fibrillation (HCC)   . Hypertension     HOSPITAL COURSE:   84 year old female with past medical history of paroxysmal atrial fibrillation, hypertension, osteoarthritis who presents to the hospital due to altered mental status.  1.   Acute toxic metabolic encephalopathy  Resolved ?  Suspected due to possible UTI Ammonia level was normal, CT head negative for any acute process, MRI of the brain noted for multiple old strokes, physical therapy did see patient while in house-recommended SNF-but patient wanted to go home with home health services, on day of discharge patient is ambulating so low/without assistance, no events overnight, clinically much improved on day of discharge as opposed to when patient was evaluated by physical therapy the day prior  2. History of paroxysmal atrial fibrillation Controlled on Coreg, Coumadin   3. Hyperlipidemia continue atorvastatin  4. Essential hypertension continue Norvasc, carvedilol, losartan.  5.  Acute abnormal urinalysis  Minimally abnormal  Treated with empiric Rocephin while in house, urine culture was contaminant-we will complete antibiotic course with cefdinir for 2 more days and have patient follow-up with primary care provider for reevaluation in 3 to 5 days   DISCHARGE CONDITIONS:   stable  CONSULTS OBTAINED:    DRUG ALLERGIES:   Allergies  Allergen Reactions  . Penicillins Rash    Has patient had a PCN reaction causing immediate rash, facial/tongue/throat swelling, SOB or lightheadedness with hypotension: Unknown Has patient had a PCN reaction causing severe rash involving mucus membranes or skin necrosis: Unknown Has patient had a PCN reaction that required hospitalization: Unknown Has patient had a PCN reaction occurring within the last 10 years: Unknown If all of the above answers are "NO", then may proceed with Cephalosporin use.     DISCHARGE MEDICATIONS:   Allergies as of 01/15/2018      Reactions   Penicillins Rash   Has patient had a PCN reaction causing immediate rash, facial/tongue/throat swelling, SOB or lightheadedness with hypotension: Unknown Has patient had a PCN reaction causing severe rash involving mucus membranes or skin necrosis: Unknown Has patient had a PCN reaction that required hospitalization: Unknown Has patient had a PCN reaction occurring within the last 10 years: Unknown If all of the above answers are "NO", then may proceed with Cephalosporin use.      Medication List    TAKE these medications   ALPRAZolam 0.5 MG tablet Commonly known as:  XANAX Take 0.5 mg by mouth at bedtime as needed for sleep.   amLODipine 10 MG tablet Commonly known as:  NORVASC Take 10 mg by mouth daily.   atorvastatin 80 MG tablet Commonly known as:  LIPITOR Take 80 mg by mouth daily.   carvedilol 25 MG tablet Commonly known as:  COREG Take 25 mg by mouth 2 (two) times daily.   cefdinir 300 MG capsule Commonly known as:  OMNICEF Take 1 capsule (300  mg total) by mouth 2 (two) times daily.   losartan 100 MG tablet Commonly known as:  COZAAR Take 100 mg by mouth daily.   warfarin 2.5 MG tablet Commonly known as:  COUMADIN Take 2.5 mg by mouth daily.        DISCHARGE INSTRUCTIONS:  If you experience worsening of your admission  symptoms, develop shortness of breath, life threatening emergency, suicidal or homicidal thoughts you must seek medical attention immediately by calling 911 or calling your MD immediately  if symptoms less severe.  You Must read complete instructions/literature along with all the possible adverse reactions/side effects for all the Medicines you take and that have been prescribed to you. Take any new Medicines after you have completely understood and accept all the possible adverse reactions/side effects.   Please note  You were cared for by a hospitalist during your hospital stay. If you have any questions about your discharge medications or the care you received while you were in the hospital after you are discharged, you can call the unit and asked to speak with the hospitalist on call if the hospitalist that took care of you is not available. Once you are discharged, your primary care physician will handle any further medical issues. Please note that NO REFILLS for any discharge medications will be authorized once you are discharged, as it is imperative that you return to your primary care physician (or establish a relationship with a primary care physician if you do not have one) for your aftercare needs so that they can reassess your need for medications and monitor your lab values.    Today   CHIEF COMPLAINT:   Chief Complaint  Patient presents with  . Altered Mental Status    HISTORY OF PRESENT ILLNESS:   84 y.o. female with a known history of essential hypertension, paroxysmal atrial fibrillation, history of previous CVA, osteoarthritis who presents to the hospital due to altered mental status.  Patient husband is at bedside and also cannot provide a good history, but as per the husband patient apparently was confused and not her baseline therefore was brought to the hospital.  Patient apparently called the police earlier and was having paranoid thoughts about her son and not acting like  herself and therefore was brought to the ER.  In the ER patient is alert awake and oriented to time place and person but still continues to have periods of confusion.  Patient denies any dysuria, hematuria, fever chills cough congestion or any other associated symptoms.  She denies any recent changes in her medications.  Patient's urinalysis was not completely positive for UTI but had many bacteria but given her progressive altered mental status and patient having no previous history of dementia or psychiatric illness hospice services were contacted for admission.  VITAL SIGNS:  Blood pressure (!) 167/61, pulse 72, temperature 98.2 F (36.8 C), resp. rate 18, height 5\' 2"  (1.575 m), weight 86.2 kg, SpO2 98 %.  I/O:    Intake/Output Summary (Last 24 hours) at 01/15/2018 1146 Last data filed at 01/15/2018 1011 Gross per 24 hour  Intake 454.85 ml  Output -  Net 454.85 ml    PHYSICAL EXAMINATION:  GENERAL:  84 y.o.-year-old patient lying in the bed with no acute distress.  EYES: Pupils equal, round, reactive to light and accommodation. No scleral icterus. Extraocular muscles intact.  HEENT: Head atraumatic, normocephalic. Oropharynx and nasopharynx clear.  NECK:  Supple, no jugular venous distention. No thyroid enlargement, no tenderness.  LUNGS: Normal  breath sounds bilaterally, no wheezing, rales,rhonchi or crepitation. No use of accessory muscles of respiration.  CARDIOVASCULAR: S1, S2 normal. No murmurs, rubs, or gallops.  ABDOMEN: Soft, non-tender, non-distended. Bowel sounds present. No organomegaly or mass.  EXTREMITIES: No pedal edema, cyanosis, or clubbing.  NEUROLOGIC: Cranial nerves II through XII are intact. Muscle strength 5/5 in all extremities. Sensation intact. Gait not checked.  PSYCHIATRIC: The patient is alert and oriented x 3.  SKIN: No obvious rash, lesion, or ulcer.   DATA REVIEW:   CBC Recent Labs  Lab 01/13/18 1529  WBC 7.8  HGB 14.9  HCT 48.1*  PLT 288     Chemistries  Recent Labs  Lab 01/13/18 1529  NA 144  K 3.5  CL 111  CO2 24  GLUCOSE 103*  BUN 22  CREATININE 1.08*  CALCIUM 9.9  AST 23  ALT 14  ALKPHOS 73  BILITOT 0.7    Cardiac Enzymes Recent Labs  Lab 01/13/18 1529  TROPONINI 0.03*    Microbiology Results  Results for orders placed or performed during the hospital encounter of 01/13/18  Urine Culture     Status: None   Collection Time: 01/13/18  4:59 PM  Result Value Ref Range Status   Specimen Description   Final    URINE, RANDOM Performed at [facility], [address], [place], [place] [address]    Special Requests   Final    NONE Performed at [facility], [address] [place], [place] [address]    Culture   Final    Multiple bacterial morphotypes present, none predominant. Suggest appropriate recollection if clinically indicated.   Report Status 01/15/2018 FINAL  Final    RADIOLOGY:  Dg Chest 2 View  Result Date: 01/13/2018 CLINICAL DATA:  Altered mental status EXAM: CHEST - 2 VIEW COMPARISON:  02/14/2015 FINDINGS: Mild cardiomegaly. No focal consolidation or significant effusion. No pneumothorax. IMPRESSION: No active cardiopulmonary disease.  Mild cardiomegaly. Electronically Signed   By: Jasmine Pang M.D.   On: 01/13/2018 19:15   Ct Head Wo Contrast  Result Date: 01/13/2018 CLINICAL DATA:  AMS. Arrives with [facility] from [facility] for evaluation of confusion. Patient brought to [facility] by [facility] today voluntarily. EXAM: CT HEAD WITHOUT CONTRAST TECHNIQUE: Contiguous axial images were obtained from the base of the skull through the vertex without intravenous contrast. COMPARISON:  CT of the head on 02/14/2015 FINDINGS: Brain: There is mild central and cortical atrophy. Periventricular white matter changes are consistent with small vessel disease. There is no intra or extra-axial fluid collection or mass lesion. The basilar cisterns and ventricles have a normal appearance. There is no CT  evidence for acute infarction or hemorrhage. Vascular: There is atherosclerotic calcification of the internal carotid arteries. Skull: Normal. Negative for fracture or focal lesion. Sinuses/Orbits: No acute finding. Other: None. IMPRESSION: 1. Mild atrophy and small vessel disease. 2.  No evidence for acute intracranial abnormality. Electronically Signed   By: Norva Pavlov M.D.   On: 01/13/2018 19:02   Mr Brain Wo Contrast  Result Date: 01/14/2018 CLINICAL DATA:  Altered mental status EXAM: MRI HEAD WITHOUT CONTRAST TECHNIQUE: Multiplanar, multiecho pulse sequences of the brain and surrounding structures were obtained without intravenous contrast. COMPARISON:  Head CT 01/13/2018 FINDINGS: BRAIN: There is no acute infarct, acute hemorrhage, hydrocephalus or extra-axial collection. The midline structures are normal. No midline shift or other mass effect. Old right cerebellar infarct and multiple old central lacunar infarcts. Multifocal white matter hyperintensity, most commonly due to  chronic ischemic microangiopathy. The cerebral and cerebellar volume are age-appropriate. Susceptibility-sensitive sequences show no chronic microhemorrhage or superficial siderosis. VASCULAR: Major intracranial arterial and venous sinus flow voids are normal. SKULL AND UPPER CERVICAL SPINE: Calvarial bone marrow signal is normal. There is no skull base mass. Visualized upper cervical spine and soft tissues are normal. SINUSES/ORBITS: No fluid levels or advanced mucosal thickening. No mastoid or middle ear effusion. The orbits are normal. IMPRESSION: 1. No acute intracranial abnormality. 2. Old right cerebellar infarct and multiple old central small vessel infarcts with findings of generalized mild chronic ischemic microangiopathy. Electronically Signed   By: Deatra Robinson M.D.   On: 01/14/2018 17:45    EKG:   Orders placed or performed during the hospital encounter of 01/13/18  . ED EKG  . ED EKG      Management  plans discussed with the patient, family and they are in agreement.  CODE STATUS:     Code Status Orders  (From admission, onward)         Start     Ordered   01/13/18 2026  Full code  Continuous     01/13/18 2025        Code Status History    This patient has a current code status but no historical code status.      TOTAL TIME TAKING CARE OF THIS PATIENT: 40 minutes.    Evelena Asa Mare Ludtke M.D on 01/15/2018 at 11:46 AM  Between 7am to 6pm - Pager - [phone]  After 6pm go to [email or web address] - password [email or web address]  [facility]  Office  [phone]  CC: Primary care physician; Barbette Reichmann, MD   Note: This dictation was prepared with Dragon dictation along with smaller phrase technology. Any transcriptional errors that result from this process are unintentional.

## 2018-01-17 LAB — RPR: RPR: NONREACTIVE

## 2018-01-25 DIAGNOSIS — F325 Major depressive disorder, single episode, in full remission: Secondary | ICD-10-CM | POA: Diagnosis not present

## 2018-01-25 DIAGNOSIS — I159 Secondary hypertension, unspecified: Secondary | ICD-10-CM | POA: Diagnosis not present

## 2018-01-25 DIAGNOSIS — Z09 Encounter for follow-up examination after completed treatment for conditions other than malignant neoplasm: Secondary | ICD-10-CM | POA: Diagnosis not present

## 2018-01-25 DIAGNOSIS — I480 Paroxysmal atrial fibrillation: Secondary | ICD-10-CM | POA: Diagnosis not present

## 2018-01-25 DIAGNOSIS — E7849 Other hyperlipidemia: Secondary | ICD-10-CM | POA: Diagnosis not present

## 2018-01-25 DIAGNOSIS — Z7901 Long term (current) use of anticoagulants: Secondary | ICD-10-CM | POA: Diagnosis not present

## 2018-01-25 DIAGNOSIS — N390 Urinary tract infection, site not specified: Secondary | ICD-10-CM | POA: Diagnosis not present

## 2018-01-25 DIAGNOSIS — Z8673 Personal history of transient ischemic attack (TIA), and cerebral infarction without residual deficits: Secondary | ICD-10-CM | POA: Diagnosis not present

## 2018-02-14 DIAGNOSIS — Z7901 Long term (current) use of anticoagulants: Secondary | ICD-10-CM | POA: Diagnosis not present

## 2018-02-21 DIAGNOSIS — Z7901 Long term (current) use of anticoagulants: Secondary | ICD-10-CM | POA: Diagnosis not present

## 2018-03-27 DIAGNOSIS — Z7901 Long term (current) use of anticoagulants: Secondary | ICD-10-CM | POA: Diagnosis not present

## 2018-03-29 DIAGNOSIS — M79675 Pain in left toe(s): Secondary | ICD-10-CM | POA: Diagnosis not present

## 2018-03-29 DIAGNOSIS — M79674 Pain in right toe(s): Secondary | ICD-10-CM | POA: Diagnosis not present

## 2018-03-29 DIAGNOSIS — B351 Tinea unguium: Secondary | ICD-10-CM | POA: Diagnosis not present

## 2018-04-24 DIAGNOSIS — Z8673 Personal history of transient ischemic attack (TIA), and cerebral infarction without residual deficits: Secondary | ICD-10-CM | POA: Diagnosis not present

## 2018-04-24 DIAGNOSIS — M159 Polyosteoarthritis, unspecified: Secondary | ICD-10-CM | POA: Diagnosis not present

## 2018-04-24 DIAGNOSIS — I10 Essential (primary) hypertension: Secondary | ICD-10-CM | POA: Diagnosis not present

## 2018-04-24 DIAGNOSIS — F329 Major depressive disorder, single episode, unspecified: Secondary | ICD-10-CM | POA: Diagnosis not present

## 2018-04-24 DIAGNOSIS — I2510 Atherosclerotic heart disease of native coronary artery without angina pectoris: Secondary | ICD-10-CM | POA: Diagnosis not present

## 2018-04-24 DIAGNOSIS — R3129 Other microscopic hematuria: Secondary | ICD-10-CM | POA: Diagnosis not present

## 2018-04-24 DIAGNOSIS — E7849 Other hyperlipidemia: Secondary | ICD-10-CM | POA: Diagnosis not present

## 2018-04-24 DIAGNOSIS — Z7901 Long term (current) use of anticoagulants: Secondary | ICD-10-CM | POA: Diagnosis not present

## 2018-04-24 DIAGNOSIS — F419 Anxiety disorder, unspecified: Secondary | ICD-10-CM | POA: Diagnosis not present

## 2018-06-12 DIAGNOSIS — F419 Anxiety disorder, unspecified: Secondary | ICD-10-CM | POA: Diagnosis not present

## 2018-06-12 DIAGNOSIS — F325 Major depressive disorder, single episode, in full remission: Secondary | ICD-10-CM | POA: Diagnosis not present

## 2018-06-12 DIAGNOSIS — I480 Paroxysmal atrial fibrillation: Secondary | ICD-10-CM | POA: Diagnosis not present

## 2018-06-12 DIAGNOSIS — Z8673 Personal history of transient ischemic attack (TIA), and cerebral infarction without residual deficits: Secondary | ICD-10-CM | POA: Diagnosis not present

## 2018-06-12 DIAGNOSIS — M159 Polyosteoarthritis, unspecified: Secondary | ICD-10-CM | POA: Diagnosis not present

## 2018-06-12 DIAGNOSIS — I2510 Atherosclerotic heart disease of native coronary artery without angina pectoris: Secondary | ICD-10-CM | POA: Diagnosis not present

## 2018-06-12 DIAGNOSIS — E7849 Other hyperlipidemia: Secondary | ICD-10-CM | POA: Diagnosis not present

## 2018-06-12 DIAGNOSIS — Z7901 Long term (current) use of anticoagulants: Secondary | ICD-10-CM | POA: Diagnosis not present

## 2018-06-12 DIAGNOSIS — I10 Essential (primary) hypertension: Secondary | ICD-10-CM | POA: Diagnosis not present

## 2018-06-27 DIAGNOSIS — Z7901 Long term (current) use of anticoagulants: Secondary | ICD-10-CM | POA: Diagnosis not present

## 2018-07-13 DIAGNOSIS — R791 Abnormal coagulation profile: Secondary | ICD-10-CM | POA: Diagnosis not present

## 2018-08-18 DIAGNOSIS — Z7901 Long term (current) use of anticoagulants: Secondary | ICD-10-CM | POA: Diagnosis not present

## 2018-10-12 DIAGNOSIS — E7849 Other hyperlipidemia: Secondary | ICD-10-CM | POA: Diagnosis not present

## 2018-10-12 DIAGNOSIS — I10 Essential (primary) hypertension: Secondary | ICD-10-CM | POA: Diagnosis not present

## 2018-10-12 DIAGNOSIS — F419 Anxiety disorder, unspecified: Secondary | ICD-10-CM | POA: Diagnosis not present

## 2018-10-12 DIAGNOSIS — I2510 Atherosclerotic heart disease of native coronary artery without angina pectoris: Secondary | ICD-10-CM | POA: Diagnosis not present

## 2018-10-12 DIAGNOSIS — Z8673 Personal history of transient ischemic attack (TIA), and cerebral infarction without residual deficits: Secondary | ICD-10-CM | POA: Diagnosis not present

## 2018-10-12 DIAGNOSIS — Z7901 Long term (current) use of anticoagulants: Secondary | ICD-10-CM | POA: Diagnosis not present

## 2018-10-12 DIAGNOSIS — M159 Polyosteoarthritis, unspecified: Secondary | ICD-10-CM | POA: Diagnosis not present

## 2018-11-15 DIAGNOSIS — Z7901 Long term (current) use of anticoagulants: Secondary | ICD-10-CM | POA: Diagnosis not present

## 2018-12-19 DIAGNOSIS — Z7901 Long term (current) use of anticoagulants: Secondary | ICD-10-CM | POA: Diagnosis not present

## 2019-01-03 DIAGNOSIS — R791 Abnormal coagulation profile: Secondary | ICD-10-CM | POA: Diagnosis not present

## 2019-02-01 DIAGNOSIS — Z23 Encounter for immunization: Secondary | ICD-10-CM | POA: Diagnosis not present

## 2019-02-01 DIAGNOSIS — Z7901 Long term (current) use of anticoagulants: Secondary | ICD-10-CM | POA: Diagnosis not present

## 2019-03-05 DIAGNOSIS — Z7901 Long term (current) use of anticoagulants: Secondary | ICD-10-CM | POA: Diagnosis not present

## 2019-04-11 DIAGNOSIS — Z7901 Long term (current) use of anticoagulants: Secondary | ICD-10-CM | POA: Diagnosis not present

## 2019-05-14 DIAGNOSIS — Z0000 Encounter for general adult medical examination without abnormal findings: Secondary | ICD-10-CM | POA: Diagnosis not present

## 2019-05-14 DIAGNOSIS — R600 Localized edema: Secondary | ICD-10-CM | POA: Diagnosis not present

## 2019-05-14 DIAGNOSIS — E785 Hyperlipidemia, unspecified: Secondary | ICD-10-CM | POA: Diagnosis not present

## 2019-05-14 DIAGNOSIS — F419 Anxiety disorder, unspecified: Secondary | ICD-10-CM | POA: Diagnosis not present

## 2019-05-14 DIAGNOSIS — Z8673 Personal history of transient ischemic attack (TIA), and cerebral infarction without residual deficits: Secondary | ICD-10-CM | POA: Diagnosis not present

## 2019-05-14 DIAGNOSIS — Z7901 Long term (current) use of anticoagulants: Secondary | ICD-10-CM | POA: Diagnosis not present

## 2019-05-14 DIAGNOSIS — I10 Essential (primary) hypertension: Secondary | ICD-10-CM | POA: Diagnosis not present

## 2019-05-14 DIAGNOSIS — R0602 Shortness of breath: Secondary | ICD-10-CM | POA: Diagnosis not present

## 2019-05-14 DIAGNOSIS — I4891 Unspecified atrial fibrillation: Secondary | ICD-10-CM | POA: Diagnosis not present

## 2019-06-12 DIAGNOSIS — M79674 Pain in right toe(s): Secondary | ICD-10-CM | POA: Diagnosis not present

## 2019-06-12 DIAGNOSIS — Z7901 Long term (current) use of anticoagulants: Secondary | ICD-10-CM | POA: Diagnosis not present

## 2019-06-12 DIAGNOSIS — B351 Tinea unguium: Secondary | ICD-10-CM | POA: Diagnosis not present

## 2019-06-12 DIAGNOSIS — L600 Ingrowing nail: Secondary | ICD-10-CM | POA: Diagnosis not present

## 2019-06-12 DIAGNOSIS — M79675 Pain in left toe(s): Secondary | ICD-10-CM | POA: Diagnosis not present

## 2019-07-19 DIAGNOSIS — Z7901 Long term (current) use of anticoagulants: Secondary | ICD-10-CM | POA: Diagnosis not present

## 2019-08-22 DIAGNOSIS — Z7901 Long term (current) use of anticoagulants: Secondary | ICD-10-CM | POA: Diagnosis not present

## 2019-09-11 DIAGNOSIS — M159 Polyosteoarthritis, unspecified: Secondary | ICD-10-CM | POA: Diagnosis not present

## 2019-09-11 DIAGNOSIS — I2510 Atherosclerotic heart disease of native coronary artery without angina pectoris: Secondary | ICD-10-CM | POA: Diagnosis not present

## 2019-09-11 DIAGNOSIS — I10 Essential (primary) hypertension: Secondary | ICD-10-CM | POA: Diagnosis not present

## 2019-09-11 DIAGNOSIS — I480 Paroxysmal atrial fibrillation: Secondary | ICD-10-CM | POA: Diagnosis not present

## 2019-09-11 DIAGNOSIS — E7849 Other hyperlipidemia: Secondary | ICD-10-CM | POA: Diagnosis not present

## 2019-09-11 DIAGNOSIS — R791 Abnormal coagulation profile: Secondary | ICD-10-CM | POA: Diagnosis not present

## 2019-09-11 DIAGNOSIS — F419 Anxiety disorder, unspecified: Secondary | ICD-10-CM | POA: Diagnosis not present

## 2019-09-18 DIAGNOSIS — R0602 Shortness of breath: Secondary | ICD-10-CM | POA: Diagnosis not present

## 2019-09-18 DIAGNOSIS — F419 Anxiety disorder, unspecified: Secondary | ICD-10-CM | POA: Diagnosis not present

## 2019-09-18 DIAGNOSIS — Z79899 Other long term (current) drug therapy: Secondary | ICD-10-CM | POA: Diagnosis not present

## 2019-09-18 DIAGNOSIS — J302 Other seasonal allergic rhinitis: Secondary | ICD-10-CM | POA: Diagnosis not present

## 2019-09-18 DIAGNOSIS — E785 Hyperlipidemia, unspecified: Secondary | ICD-10-CM | POA: Diagnosis not present

## 2019-09-18 DIAGNOSIS — Z0000 Encounter for general adult medical examination without abnormal findings: Secondary | ICD-10-CM | POA: Diagnosis not present

## 2019-09-18 DIAGNOSIS — I10 Essential (primary) hypertension: Secondary | ICD-10-CM | POA: Diagnosis not present

## 2019-09-18 DIAGNOSIS — M159 Polyosteoarthritis, unspecified: Secondary | ICD-10-CM | POA: Diagnosis not present

## 2019-09-18 DIAGNOSIS — I4891 Unspecified atrial fibrillation: Secondary | ICD-10-CM | POA: Diagnosis not present

## 2019-09-25 DIAGNOSIS — R791 Abnormal coagulation profile: Secondary | ICD-10-CM | POA: Diagnosis not present

## 2019-10-30 DIAGNOSIS — Z7901 Long term (current) use of anticoagulants: Secondary | ICD-10-CM | POA: Diagnosis not present

## 2019-11-30 DIAGNOSIS — Z7901 Long term (current) use of anticoagulants: Secondary | ICD-10-CM | POA: Diagnosis not present

## 2019-12-25 DIAGNOSIS — Z0000 Encounter for general adult medical examination without abnormal findings: Secondary | ICD-10-CM | POA: Diagnosis not present

## 2019-12-25 DIAGNOSIS — I4891 Unspecified atrial fibrillation: Secondary | ICD-10-CM | POA: Diagnosis not present

## 2019-12-25 DIAGNOSIS — F419 Anxiety disorder, unspecified: Secondary | ICD-10-CM | POA: Diagnosis not present

## 2019-12-25 DIAGNOSIS — I2510 Atherosclerotic heart disease of native coronary artery without angina pectoris: Secondary | ICD-10-CM | POA: Diagnosis not present

## 2019-12-25 DIAGNOSIS — M159 Polyosteoarthritis, unspecified: Secondary | ICD-10-CM | POA: Diagnosis not present

## 2019-12-25 DIAGNOSIS — I10 Essential (primary) hypertension: Secondary | ICD-10-CM | POA: Diagnosis not present

## 2020-01-03 DIAGNOSIS — R739 Hyperglycemia, unspecified: Secondary | ICD-10-CM | POA: Diagnosis not present

## 2020-01-03 DIAGNOSIS — Z7901 Long term (current) use of anticoagulants: Secondary | ICD-10-CM | POA: Diagnosis not present

## 2020-01-03 DIAGNOSIS — Z79899 Other long term (current) drug therapy: Secondary | ICD-10-CM | POA: Diagnosis not present

## 2020-01-03 DIAGNOSIS — I4891 Unspecified atrial fibrillation: Secondary | ICD-10-CM | POA: Diagnosis not present

## 2020-01-03 DIAGNOSIS — I10 Essential (primary) hypertension: Secondary | ICD-10-CM | POA: Diagnosis not present

## 2020-01-03 DIAGNOSIS — F419 Anxiety disorder, unspecified: Secondary | ICD-10-CM | POA: Diagnosis not present

## 2020-01-29 DIAGNOSIS — Z7901 Long term (current) use of anticoagulants: Secondary | ICD-10-CM | POA: Diagnosis not present

## 2020-02-29 DIAGNOSIS — R791 Abnormal coagulation profile: Secondary | ICD-10-CM | POA: Diagnosis not present

## 2020-02-29 DIAGNOSIS — M79674 Pain in right toe(s): Secondary | ICD-10-CM | POA: Diagnosis not present

## 2020-02-29 DIAGNOSIS — B351 Tinea unguium: Secondary | ICD-10-CM | POA: Diagnosis not present

## 2020-02-29 DIAGNOSIS — M79675 Pain in left toe(s): Secondary | ICD-10-CM | POA: Diagnosis not present

## 2020-04-03 DIAGNOSIS — I480 Paroxysmal atrial fibrillation: Secondary | ICD-10-CM | POA: Diagnosis not present

## 2020-04-30 DIAGNOSIS — Z7901 Long term (current) use of anticoagulants: Secondary | ICD-10-CM | POA: Diagnosis not present

## 2020-05-15 DIAGNOSIS — M159 Polyosteoarthritis, unspecified: Secondary | ICD-10-CM | POA: Diagnosis not present

## 2020-05-15 DIAGNOSIS — R739 Hyperglycemia, unspecified: Secondary | ICD-10-CM | POA: Diagnosis not present

## 2020-05-15 DIAGNOSIS — F419 Anxiety disorder, unspecified: Secondary | ICD-10-CM | POA: Diagnosis not present

## 2020-05-15 DIAGNOSIS — I2510 Atherosclerotic heart disease of native coronary artery without angina pectoris: Secondary | ICD-10-CM | POA: Diagnosis not present

## 2020-05-15 DIAGNOSIS — F32A Depression, unspecified: Secondary | ICD-10-CM | POA: Diagnosis not present

## 2020-05-15 DIAGNOSIS — I480 Paroxysmal atrial fibrillation: Secondary | ICD-10-CM | POA: Diagnosis not present

## 2020-06-06 DIAGNOSIS — Z7901 Long term (current) use of anticoagulants: Secondary | ICD-10-CM | POA: Diagnosis not present

## 2020-07-10 DIAGNOSIS — Z7901 Long term (current) use of anticoagulants: Secondary | ICD-10-CM | POA: Diagnosis not present

## 2020-07-23 DIAGNOSIS — R791 Abnormal coagulation profile: Secondary | ICD-10-CM | POA: Diagnosis not present

## 2020-09-17 DIAGNOSIS — Z7901 Long term (current) use of anticoagulants: Secondary | ICD-10-CM | POA: Diagnosis not present

## 2020-10-09 DIAGNOSIS — R791 Abnormal coagulation profile: Secondary | ICD-10-CM | POA: Diagnosis not present

## 2020-10-29 DIAGNOSIS — R791 Abnormal coagulation profile: Secondary | ICD-10-CM | POA: Diagnosis not present

## 2020-12-18 DIAGNOSIS — R410 Disorientation, unspecified: Secondary | ICD-10-CM | POA: Diagnosis not present

## 2020-12-18 DIAGNOSIS — F419 Anxiety disorder, unspecified: Secondary | ICD-10-CM | POA: Diagnosis not present

## 2020-12-18 DIAGNOSIS — I2510 Atherosclerotic heart disease of native coronary artery without angina pectoris: Secondary | ICD-10-CM | POA: Diagnosis not present

## 2020-12-18 DIAGNOSIS — Z5181 Encounter for therapeutic drug level monitoring: Secondary | ICD-10-CM | POA: Diagnosis not present

## 2020-12-18 DIAGNOSIS — R413 Other amnesia: Secondary | ICD-10-CM | POA: Diagnosis not present

## 2020-12-18 DIAGNOSIS — Z7901 Long term (current) use of anticoagulants: Secondary | ICD-10-CM | POA: Diagnosis not present

## 2020-12-18 DIAGNOSIS — I4891 Unspecified atrial fibrillation: Secondary | ICD-10-CM | POA: Diagnosis not present

## 2020-12-18 DIAGNOSIS — I10 Essential (primary) hypertension: Secondary | ICD-10-CM | POA: Diagnosis not present

## 2020-12-18 DIAGNOSIS — I480 Paroxysmal atrial fibrillation: Secondary | ICD-10-CM | POA: Diagnosis not present

## 2021-02-03 DIAGNOSIS — R2990 Unspecified symptoms and signs involving the nervous system: Secondary | ICD-10-CM | POA: Diagnosis not present

## 2021-02-03 DIAGNOSIS — Z5181 Encounter for therapeutic drug level monitoring: Secondary | ICD-10-CM | POA: Diagnosis not present

## 2021-02-03 DIAGNOSIS — Z7901 Long term (current) use of anticoagulants: Secondary | ICD-10-CM | POA: Diagnosis not present

## 2021-02-25 DIAGNOSIS — L600 Ingrowing nail: Secondary | ICD-10-CM | POA: Diagnosis not present

## 2021-02-25 DIAGNOSIS — M79675 Pain in left toe(s): Secondary | ICD-10-CM | POA: Diagnosis not present

## 2021-02-25 DIAGNOSIS — M79674 Pain in right toe(s): Secondary | ICD-10-CM | POA: Diagnosis not present

## 2021-02-25 DIAGNOSIS — B351 Tinea unguium: Secondary | ICD-10-CM | POA: Diagnosis not present

## 2021-04-01 DIAGNOSIS — R791 Abnormal coagulation profile: Secondary | ICD-10-CM | POA: Diagnosis not present

## 2021-04-16 DIAGNOSIS — Z5181 Encounter for therapeutic drug level monitoring: Secondary | ICD-10-CM | POA: Diagnosis not present

## 2021-04-16 DIAGNOSIS — I480 Paroxysmal atrial fibrillation: Secondary | ICD-10-CM | POA: Diagnosis not present

## 2021-04-16 DIAGNOSIS — Z7901 Long term (current) use of anticoagulants: Secondary | ICD-10-CM | POA: Diagnosis not present

## 2021-04-20 DIAGNOSIS — Z0000 Encounter for general adult medical examination without abnormal findings: Secondary | ICD-10-CM | POA: Diagnosis not present

## 2021-04-20 DIAGNOSIS — I10 Essential (primary) hypertension: Secondary | ICD-10-CM | POA: Diagnosis not present

## 2021-04-20 DIAGNOSIS — F419 Anxiety disorder, unspecified: Secondary | ICD-10-CM | POA: Diagnosis not present

## 2021-04-20 DIAGNOSIS — R413 Other amnesia: Secondary | ICD-10-CM | POA: Diagnosis not present

## 2021-04-20 DIAGNOSIS — Z1389 Encounter for screening for other disorder: Secondary | ICD-10-CM | POA: Diagnosis not present

## 2021-04-20 DIAGNOSIS — F32A Depression, unspecified: Secondary | ICD-10-CM | POA: Diagnosis not present

## 2021-04-20 DIAGNOSIS — I4891 Unspecified atrial fibrillation: Secondary | ICD-10-CM | POA: Diagnosis not present

## 2021-12-17 DIAGNOSIS — I480 Paroxysmal atrial fibrillation: Secondary | ICD-10-CM | POA: Diagnosis not present

## 2021-12-24 DIAGNOSIS — I480 Paroxysmal atrial fibrillation: Secondary | ICD-10-CM | POA: Diagnosis not present

## 2021-12-29 DIAGNOSIS — I480 Paroxysmal atrial fibrillation: Secondary | ICD-10-CM | POA: Diagnosis not present

## 2021-12-29 DIAGNOSIS — Z7901 Long term (current) use of anticoagulants: Secondary | ICD-10-CM | POA: Diagnosis not present

## 2021-12-29 DIAGNOSIS — I6389 Other cerebral infarction: Secondary | ICD-10-CM | POA: Diagnosis not present

## 2021-12-29 DIAGNOSIS — R2990 Unspecified symptoms and signs involving the nervous system: Secondary | ICD-10-CM | POA: Diagnosis not present

## 2021-12-29 DIAGNOSIS — Z5181 Encounter for therapeutic drug level monitoring: Secondary | ICD-10-CM | POA: Diagnosis not present

## 2022-01-06 DIAGNOSIS — I480 Paroxysmal atrial fibrillation: Secondary | ICD-10-CM | POA: Diagnosis not present

## 2022-01-06 DIAGNOSIS — Z5181 Encounter for therapeutic drug level monitoring: Secondary | ICD-10-CM | POA: Diagnosis not present

## 2022-01-06 DIAGNOSIS — Z7901 Long term (current) use of anticoagulants: Secondary | ICD-10-CM | POA: Diagnosis not present

## 2022-01-18 DIAGNOSIS — I480 Paroxysmal atrial fibrillation: Secondary | ICD-10-CM | POA: Diagnosis not present

## 2022-02-01 DIAGNOSIS — I480 Paroxysmal atrial fibrillation: Secondary | ICD-10-CM | POA: Diagnosis not present

## 2022-02-15 DIAGNOSIS — I480 Paroxysmal atrial fibrillation: Secondary | ICD-10-CM | POA: Diagnosis not present

## 2022-03-02 DIAGNOSIS — I480 Paroxysmal atrial fibrillation: Secondary | ICD-10-CM | POA: Diagnosis not present

## 2022-03-23 DIAGNOSIS — R413 Other amnesia: Secondary | ICD-10-CM | POA: Diagnosis not present

## 2022-03-23 DIAGNOSIS — I480 Paroxysmal atrial fibrillation: Secondary | ICD-10-CM | POA: Diagnosis not present

## 2022-03-23 DIAGNOSIS — F419 Anxiety disorder, unspecified: Secondary | ICD-10-CM | POA: Diagnosis not present

## 2022-03-23 DIAGNOSIS — R2689 Other abnormalities of gait and mobility: Secondary | ICD-10-CM | POA: Diagnosis not present

## 2022-03-23 DIAGNOSIS — Z5181 Encounter for therapeutic drug level monitoring: Secondary | ICD-10-CM | POA: Diagnosis not present

## 2022-03-23 DIAGNOSIS — Z7901 Long term (current) use of anticoagulants: Secondary | ICD-10-CM | POA: Diagnosis not present

## 2022-03-23 DIAGNOSIS — E119 Type 2 diabetes mellitus without complications: Secondary | ICD-10-CM | POA: Diagnosis not present

## 2022-03-23 DIAGNOSIS — M159 Polyosteoarthritis, unspecified: Secondary | ICD-10-CM | POA: Diagnosis not present

## 2022-03-23 DIAGNOSIS — I10 Essential (primary) hypertension: Secondary | ICD-10-CM | POA: Diagnosis not present

## 2022-04-08 DIAGNOSIS — M79675 Pain in left toe(s): Secondary | ICD-10-CM | POA: Diagnosis not present

## 2022-04-08 DIAGNOSIS — L600 Ingrowing nail: Secondary | ICD-10-CM | POA: Diagnosis not present

## 2022-04-08 DIAGNOSIS — B351 Tinea unguium: Secondary | ICD-10-CM | POA: Diagnosis not present

## 2022-04-08 DIAGNOSIS — B353 Tinea pedis: Secondary | ICD-10-CM | POA: Diagnosis not present

## 2022-04-08 DIAGNOSIS — M79674 Pain in right toe(s): Secondary | ICD-10-CM | POA: Diagnosis not present

## 2022-04-19 DIAGNOSIS — Z7901 Long term (current) use of anticoagulants: Secondary | ICD-10-CM | POA: Diagnosis not present

## 2022-04-19 DIAGNOSIS — I480 Paroxysmal atrial fibrillation: Secondary | ICD-10-CM | POA: Diagnosis not present

## 2022-04-19 DIAGNOSIS — I6389 Other cerebral infarction: Secondary | ICD-10-CM | POA: Diagnosis not present

## 2022-04-19 DIAGNOSIS — R2990 Unspecified symptoms and signs involving the nervous system: Secondary | ICD-10-CM | POA: Diagnosis not present

## 2022-04-19 DIAGNOSIS — Z5181 Encounter for therapeutic drug level monitoring: Secondary | ICD-10-CM | POA: Diagnosis not present

## 2022-05-24 DIAGNOSIS — I480 Paroxysmal atrial fibrillation: Secondary | ICD-10-CM | POA: Diagnosis not present

## 2022-05-24 DIAGNOSIS — R2990 Unspecified symptoms and signs involving the nervous system: Secondary | ICD-10-CM | POA: Diagnosis not present

## 2022-06-14 DIAGNOSIS — I480 Paroxysmal atrial fibrillation: Secondary | ICD-10-CM | POA: Diagnosis not present

## 2022-06-14 DIAGNOSIS — R2990 Unspecified symptoms and signs involving the nervous system: Secondary | ICD-10-CM | POA: Diagnosis not present

## 2022-07-16 DIAGNOSIS — Z1331 Encounter for screening for depression: Secondary | ICD-10-CM | POA: Diagnosis not present

## 2022-07-16 DIAGNOSIS — I4891 Unspecified atrial fibrillation: Secondary | ICD-10-CM | POA: Diagnosis not present

## 2022-07-16 DIAGNOSIS — I10 Essential (primary) hypertension: Secondary | ICD-10-CM | POA: Diagnosis not present

## 2022-07-16 DIAGNOSIS — Z7901 Long term (current) use of anticoagulants: Secondary | ICD-10-CM | POA: Diagnosis not present

## 2022-07-16 DIAGNOSIS — F419 Anxiety disorder, unspecified: Secondary | ICD-10-CM | POA: Diagnosis not present

## 2022-07-16 DIAGNOSIS — Z0000 Encounter for general adult medical examination without abnormal findings: Secondary | ICD-10-CM | POA: Diagnosis not present

## 2022-07-16 DIAGNOSIS — F32A Depression, unspecified: Secondary | ICD-10-CM | POA: Diagnosis not present

## 2022-07-16 DIAGNOSIS — I480 Paroxysmal atrial fibrillation: Secondary | ICD-10-CM | POA: Diagnosis not present

## 2022-07-16 DIAGNOSIS — J3089 Other allergic rhinitis: Secondary | ICD-10-CM | POA: Diagnosis not present

## 2022-08-02 DIAGNOSIS — I480 Paroxysmal atrial fibrillation: Secondary | ICD-10-CM | POA: Diagnosis not present

## 2022-08-02 DIAGNOSIS — R2990 Unspecified symptoms and signs involving the nervous system: Secondary | ICD-10-CM | POA: Diagnosis not present

## 2022-08-30 DIAGNOSIS — I480 Paroxysmal atrial fibrillation: Secondary | ICD-10-CM | POA: Diagnosis not present

## 2022-09-30 DIAGNOSIS — I480 Paroxysmal atrial fibrillation: Secondary | ICD-10-CM | POA: Diagnosis not present

## 2022-10-07 DIAGNOSIS — I480 Paroxysmal atrial fibrillation: Secondary | ICD-10-CM | POA: Diagnosis not present

## 2022-10-07 DIAGNOSIS — R2990 Unspecified symptoms and signs involving the nervous system: Secondary | ICD-10-CM | POA: Diagnosis not present

## 2022-10-28 DIAGNOSIS — R2990 Unspecified symptoms and signs involving the nervous system: Secondary | ICD-10-CM | POA: Diagnosis not present

## 2022-10-28 DIAGNOSIS — I480 Paroxysmal atrial fibrillation: Secondary | ICD-10-CM | POA: Diagnosis not present

## 2022-11-25 DIAGNOSIS — I480 Paroxysmal atrial fibrillation: Secondary | ICD-10-CM | POA: Diagnosis not present

## 2022-11-25 DIAGNOSIS — R2990 Unspecified symptoms and signs involving the nervous system: Secondary | ICD-10-CM | POA: Diagnosis not present

## 2022-12-09 DIAGNOSIS — I480 Paroxysmal atrial fibrillation: Secondary | ICD-10-CM | POA: Diagnosis not present

## 2022-12-09 DIAGNOSIS — R2990 Unspecified symptoms and signs involving the nervous system: Secondary | ICD-10-CM | POA: Diagnosis not present

## 2022-12-30 DIAGNOSIS — I480 Paroxysmal atrial fibrillation: Secondary | ICD-10-CM | POA: Diagnosis not present

## 2022-12-30 DIAGNOSIS — R2990 Unspecified symptoms and signs involving the nervous system: Secondary | ICD-10-CM | POA: Diagnosis not present

## 2023-01-17 DIAGNOSIS — B353 Tinea pedis: Secondary | ICD-10-CM | POA: Diagnosis not present

## 2023-01-17 DIAGNOSIS — M79675 Pain in left toe(s): Secondary | ICD-10-CM | POA: Diagnosis not present

## 2023-01-17 DIAGNOSIS — B351 Tinea unguium: Secondary | ICD-10-CM | POA: Diagnosis not present

## 2023-01-17 DIAGNOSIS — L600 Ingrowing nail: Secondary | ICD-10-CM | POA: Diagnosis not present

## 2023-01-17 DIAGNOSIS — M79674 Pain in right toe(s): Secondary | ICD-10-CM | POA: Diagnosis not present

## 2023-01-26 DIAGNOSIS — I480 Paroxysmal atrial fibrillation: Secondary | ICD-10-CM | POA: Diagnosis not present

## 2023-01-26 DIAGNOSIS — R2990 Unspecified symptoms and signs involving the nervous system: Secondary | ICD-10-CM | POA: Diagnosis not present

## 2023-02-24 DIAGNOSIS — R2990 Unspecified symptoms and signs involving the nervous system: Secondary | ICD-10-CM | POA: Diagnosis not present

## 2023-02-24 DIAGNOSIS — I480 Paroxysmal atrial fibrillation: Secondary | ICD-10-CM | POA: Diagnosis not present

## 2023-04-06 DIAGNOSIS — R2990 Unspecified symptoms and signs involving the nervous system: Secondary | ICD-10-CM | POA: Diagnosis not present

## 2023-04-06 DIAGNOSIS — I480 Paroxysmal atrial fibrillation: Secondary | ICD-10-CM | POA: Diagnosis not present

## 2023-04-20 DIAGNOSIS — R2990 Unspecified symptoms and signs involving the nervous system: Secondary | ICD-10-CM | POA: Diagnosis not present

## 2023-04-20 DIAGNOSIS — I480 Paroxysmal atrial fibrillation: Secondary | ICD-10-CM | POA: Diagnosis not present

## 2023-04-26 DIAGNOSIS — I480 Paroxysmal atrial fibrillation: Secondary | ICD-10-CM | POA: Diagnosis not present

## 2023-04-26 DIAGNOSIS — R5381 Other malaise: Secondary | ICD-10-CM | POA: Diagnosis not present

## 2023-04-26 DIAGNOSIS — J069 Acute upper respiratory infection, unspecified: Secondary | ICD-10-CM | POA: Diagnosis not present

## 2023-04-26 DIAGNOSIS — R413 Other amnesia: Secondary | ICD-10-CM | POA: Diagnosis not present

## 2023-04-26 DIAGNOSIS — M159 Polyosteoarthritis, unspecified: Secondary | ICD-10-CM | POA: Diagnosis not present

## 2023-04-26 DIAGNOSIS — E7800 Pure hypercholesterolemia, unspecified: Secondary | ICD-10-CM | POA: Diagnosis not present

## 2023-04-26 DIAGNOSIS — F419 Anxiety disorder, unspecified: Secondary | ICD-10-CM | POA: Diagnosis not present

## 2023-04-26 DIAGNOSIS — R5383 Other fatigue: Secondary | ICD-10-CM | POA: Diagnosis not present

## 2023-04-26 DIAGNOSIS — E785 Hyperlipidemia, unspecified: Secondary | ICD-10-CM | POA: Diagnosis not present

## 2023-04-26 DIAGNOSIS — I4891 Unspecified atrial fibrillation: Secondary | ICD-10-CM | POA: Diagnosis not present

## 2023-04-26 DIAGNOSIS — Z5181 Encounter for therapeutic drug level monitoring: Secondary | ICD-10-CM | POA: Diagnosis not present

## 2023-04-26 DIAGNOSIS — I10 Essential (primary) hypertension: Secondary | ICD-10-CM | POA: Diagnosis not present

## 2023-04-26 DIAGNOSIS — Z0000 Encounter for general adult medical examination without abnormal findings: Secondary | ICD-10-CM | POA: Diagnosis not present

## 2023-04-26 DIAGNOSIS — R262 Difficulty in walking, not elsewhere classified: Secondary | ICD-10-CM | POA: Diagnosis not present

## 2023-04-26 DIAGNOSIS — Z7901 Long term (current) use of anticoagulants: Secondary | ICD-10-CM | POA: Diagnosis not present

## 2023-05-16 DIAGNOSIS — I480 Paroxysmal atrial fibrillation: Secondary | ICD-10-CM | POA: Diagnosis not present

## 2023-05-16 DIAGNOSIS — R2990 Unspecified symptoms and signs involving the nervous system: Secondary | ICD-10-CM | POA: Diagnosis not present

## 2023-06-14 DIAGNOSIS — I480 Paroxysmal atrial fibrillation: Secondary | ICD-10-CM | POA: Diagnosis not present

## 2023-06-14 DIAGNOSIS — R2990 Unspecified symptoms and signs involving the nervous system: Secondary | ICD-10-CM | POA: Diagnosis not present

## 2023-07-19 DIAGNOSIS — I480 Paroxysmal atrial fibrillation: Secondary | ICD-10-CM | POA: Diagnosis not present

## 2023-07-19 DIAGNOSIS — R2990 Unspecified symptoms and signs involving the nervous system: Secondary | ICD-10-CM | POA: Diagnosis not present

## 2023-08-16 DIAGNOSIS — R2990 Unspecified symptoms and signs involving the nervous system: Secondary | ICD-10-CM | POA: Diagnosis not present

## 2023-08-16 DIAGNOSIS — I480 Paroxysmal atrial fibrillation: Secondary | ICD-10-CM | POA: Diagnosis not present

## 2023-08-17 DIAGNOSIS — I10 Essential (primary) hypertension: Secondary | ICD-10-CM | POA: Diagnosis not present

## 2023-08-17 DIAGNOSIS — Z5181 Encounter for therapeutic drug level monitoring: Secondary | ICD-10-CM | POA: Diagnosis not present

## 2023-08-17 DIAGNOSIS — R262 Difficulty in walking, not elsewhere classified: Secondary | ICD-10-CM | POA: Diagnosis not present

## 2023-08-17 DIAGNOSIS — Z7901 Long term (current) use of anticoagulants: Secondary | ICD-10-CM | POA: Diagnosis not present

## 2023-08-17 DIAGNOSIS — J209 Acute bronchitis, unspecified: Secondary | ICD-10-CM | POA: Diagnosis not present

## 2023-08-17 DIAGNOSIS — Z8679 Personal history of other diseases of the circulatory system: Secondary | ICD-10-CM | POA: Diagnosis not present

## 2023-08-17 DIAGNOSIS — I4891 Unspecified atrial fibrillation: Secondary | ICD-10-CM | POA: Diagnosis not present

## 2023-08-17 NOTE — Progress Notes (Signed)
 Chief Complaint  Patient presents with  . Cough    HPI  Cassandra Weiss is a 89 y.o. here for an Acute visit  Hx of HTN, Anxiety, headaches and Allergies.and hx of  Bilat frontal lobe infarcts and  A-fib C/o Cough with productive sputum x 2 weeks  C/o Decreased energy  On Coumadin  -(Eliquis was too expensive) Denies chest pains  Non Smoker. No alcohol . Appetite is good Has some urge urinary incontinence. Uses a cane  when she goes out  Recent labs:Hgb;13.3, Sugar; 88, Se Creat; 1.0, A1c; 5.6 Total Cholesterol; 179   riglycerides; 41 TSH: 1.472 and INR: 3.5    ROS Rest of 10 point review of systems is normal.  Outpatient Encounter Medications as of 08/17/2023  Medication Sig Dispense Refill  . amLODIPine  (NORVASC ) 10 MG tablet TAKE 1 TABLET BY MOUTH DAILY 90 tablet 1  . atorvastatin  (LIPITOR) 80 MG tablet TAKE 1 TABLET BY MOUTH DAILY 90 tablet 1  . carvediloL  (COREG ) 25 MG tablet TAKE 1 TABLET BY MOUTH TWICE A DAY WITH MEALS. 180 tablet 1  . losartan  (COZAAR ) 100 MG tablet TAKE 1 TABLET BY MOUTH DAILY 90 tablet 1  . multivitamin tablet Take 1 tablet by mouth once daily.    SABRA warfarin (COUMADIN ) 2.5 MG tablet TAKE 1 TABLET BY MOUTH ON MONDAYS, WEDNESDAYS AND FRIDAYS. TAKE 1/2 TABLET THE OTHER DAYS OF THE WEEK. 90 tablet 0  . donepeziL (ARICEPT) 5 MG tablet Take 1 tablet (5 mg total) by mouth at bedtime 30 tablet 5   No facility-administered encounter medications on file as of 08/17/2023.    Allergies as of 08/17/2023 - Reviewed 08/17/2023  Allergen Reaction Noted  . Penicillins Rash 10/19/2011    Past Medical History:  Diagnosis Date  . Allergic state   . Anxiety   . Atrial fibrillation (CMS/HHS-HCC)   . Coronary artery disease   . Depression   . Hyperlipidemia   . Hypertension   . Shortness of breath   . Stroke (CMS/HHS-HCC)     Past Surgical History:  Procedure Laterality Date  . APPENDECTOMY    . Hand surgery for carpal tunnel syndrome    . HYSTERECTOMY       Vitals:   08/17/23 1429  BP: (!) 154/76  Pulse: 108  Temp: 36.4 C (97.6 F)    Nurse Only on 08/16/2023  Component Date Value Ref Range Status  . POC Prothrombin Time WB 08/16/2023 56.2 (H)  9.5 - 13.1 sec Final  . POC Prothrombin INR WB 08/16/2023 4.7 (H)  0.8 - 1.1 Final  . INR - External 08/16/2023 4.7 (!)  0.9 - 1.1 Final  Nurse Only on 07/19/2023  Component Date Value Ref Range Status  . POC Prothrombin Time WB 07/19/2023 26.0 (H)  9.5 - 13.1 sec Final  . POC Prothrombin INR WB 07/19/2023 2.2 (H)  0.8 - 1.1 Final  . INR - External 07/19/2023 2.2 (!)  0.9 - 1.1 Final  Nurse Only on 06/14/2023  Component Date Value Ref Range Status  . POC Prothrombin Time WB 06/14/2023 26.7 (H)  9.5 - 13.1 sec Final  . POC Prothrombin INR WB 06/14/2023 2.2 (H)  0.8 - 1.1 Final  . INR - External 06/14/2023 2.2 (!)  0.9 - 1.1 Final    Exam Blood pressure (!) 154/76, pulse 108, temperature 36.4 C (97.6 F), temperature source Oral, height 157.5 cm (5' 2), weight 60.3 kg (133 lb), SpO2 94%.  Blood pressure (!) 154/76, pulse 108,  temperature 36.4 C (97.6 F), temperature source Oral, height 157.5 cm (5' 2), weight 60.3 kg (133 lb), SpO2 94%.  Wt Readings from Last 3 Encounters:  08/17/23 60.3 kg (133 lb)  04/26/23 61.7 kg (136 lb)  01/17/23 60.3 kg (132 lb 15 oz)     Not in distress General: Alert oriented x3  Skin: No suspicious lesions or moles.   Eyes: Sclera and conjunctiva clear; pupils equal round and reactive to light.; extraocular movements intact Ears: External ears and canal normal; tympanic membranes normal.   Nose: Mucosa healthy without drainage or ulceration Oropharynx: No suspicious lesions Neck: No swelling, masses, stiffness, pain, limited movement, carotid pulses normal bilaterally, thyroid  normal size, no masses palpated. No bruits heard. Lungs: Respirations unlabored; Bilateral expiratory wheezes noted  Back: No spinal deformity Cardiovascular: Heart Irregular  rate and rhythm without murmurs, gallops, or rubs Abdomen: Soft; non tender; non distended;  no masses or organomegaly Lymph Nodes: No significant cervical or supraclavicular lymphadenopathy noted  Musculoskeletal: Osteoarthritic changes noted small joints of hands  Extremities: Normal, No edema Neurologic: Alert and orietnted; speech intact; In wheel chair- Face symmetrical; moves all extremities well     Assessment and Plan: 1 Acute Bronchitis with bronchospasm; CXR today  Rec Doxycycline 100 mg po twice a day x 10 days  Prednisone taper starting at 30 mg po qd x 2 days and decrease by 10 mg every 2 days until gone  2 Memory loss and Depression States she is doing better  On  Aricept 5 mg po qd  No longer on Zoloft  3 HTN; On Norvasc  10 mg po qd,Cozaar , HCTZ 12.5 mg po qd, and Carvedilol    4 A- Fib/Embolic CVA ;On Warfarin and Atorvastatin   Recent INR ; 4.7 - Follow recs of INR clinic  Check ECHO  5 Shortness of breath;On Ventolin inhaler as needed 6 Perennial allergies; On  Claritin prn 7 Hyperlipidemia; On Lipitor 8 Elevated sugar; Rec low carb diet  9 Health Maintenance; Colonoscopy.2013- Divertics, intrnal hemorrhoids No need for Pap smear because of Hysterectomy. Up to date with Flu shot  Pneumovax  And COVID 19 vaccine Declines Mammogarm. Check Cbc, Met-c, lipids, ua , TSH 1 week prior to next visit  Follow up as scheduled    Tamra Leventhal MD

## 2023-08-24 DIAGNOSIS — R2990 Unspecified symptoms and signs involving the nervous system: Secondary | ICD-10-CM | POA: Diagnosis not present

## 2023-08-24 DIAGNOSIS — I480 Paroxysmal atrial fibrillation: Secondary | ICD-10-CM | POA: Diagnosis not present

## 2023-08-31 DIAGNOSIS — I480 Paroxysmal atrial fibrillation: Secondary | ICD-10-CM | POA: Diagnosis not present

## 2023-08-31 DIAGNOSIS — R2990 Unspecified symptoms and signs involving the nervous system: Secondary | ICD-10-CM | POA: Diagnosis not present

## 2023-08-31 DIAGNOSIS — R053 Chronic cough: Secondary | ICD-10-CM | POA: Diagnosis not present

## 2023-08-31 NOTE — Progress Notes (Signed)
 Documentation for warfarin management Last 3 INR results:  Lab Results  Component Value Date   INR 2.0 (!) 08/31/2023   INR 4.6 (!) 08/24/2023   INR 4.7 (!) 08/16/2023    Dosage adjustment or any change in the treatment regimen today: No. INR within goal range. Patient reports holding warfarin 2 days and reducing total weekly dose as instructed post 08/24/23 supra-therapeutic INR. Patient advised to continue warfarin dosing of 1.25mg  (2.5mg  x 0.5) everyday.  Management of result: nurse driven protocol implemented  Patient/caregiver education provided during today's visit: dosing.  Patient/caregiver did verbalize understanding of the no change in dose and any education provided.   The patient/caregiver is instructed to return in one month for recheck.  Future Appointments     Date/Time Provider Department Center Visit Type   08/31/2023 2:00 PM [facility] INT MED US  1 [facility] C ECHO COMPLETE   09/26/2023 1:30 PM [facility] IM NURSE POD C [facility] C NURSE VISIT   09/26/2023 1:30 PM [facility]-ANTICOAG [facility] C ANTI-COAG   10/27/2023 9:00 AM [facility] [place] [facility] LAB   11/03/2023 3:15 PM Hande, Tamra Cal, MD [facility] C PHYSICAL

## 2023-09-27 DIAGNOSIS — R2990 Unspecified symptoms and signs involving the nervous system: Secondary | ICD-10-CM | POA: Diagnosis not present

## 2023-09-27 DIAGNOSIS — I480 Paroxysmal atrial fibrillation: Secondary | ICD-10-CM | POA: Diagnosis not present

## 2023-09-27 NOTE — Progress Notes (Signed)
 Documentation for warfarin management Last 3 INR results:  Lab Results  Component Value Date   INR 1.4 (!) 09/27/2023   INR 2.0 (!) 08/31/2023   INR 4.6 (!) 08/24/2023    Dosage adjustment or any change in the treatment regimen today: Yes  INR significantly below goal range. Patient denies missed doses or medication/lifestyle changes, Patient advised to increase warfarin dosing regimen to 10mg  total weekly dose, 2.5mg  (2.5mg  x 1) Wednesdays and 1.25mg  (2.5mg  x 0.5) all other days of the week. Dr Sadie consulted on dosing.   Management of result: reviewed with Dr Sadie  Patient/caregiver education provided during today's visit: dosing.  Patient/caregiver did verbalize understanding of the change in dose and any education provided.   The patient/caregiver is instructed to return in two weeks for recheck.  Future Appointments     Date/Time Provider Department Center Visit Type   09/27/2023 1:30 PM [facility] NURSE POD C [facility] C NURSE VISIT   09/27/2023 1:30 PM [facility]ANTICOAG [facility] [place] C ANTI-COAG   10/10/2023 1:00 PM [facility] NURSE POD C [name] C NURSE VISIT   10/10/2023 1:00 PM [facility]-ANTICOAG [facility] [place] [facility] ANTI-COAG   10/27/2023 9:00 AM [facility] [place] [facility] LAB   11/03/2023 3:15 PM Hande, Tamra Cal, MD [facility] C PHYSICAL

## 2023-10-04 ENCOUNTER — Other Ambulatory Visit: Payer: Self-pay

## 2023-10-04 ENCOUNTER — Emergency Department

## 2023-10-04 ENCOUNTER — Encounter: Payer: Self-pay | Admitting: Internal Medicine

## 2023-10-04 ENCOUNTER — Observation Stay
Admission: EM | Admit: 2023-10-04 | Discharge: 2023-10-07 | Disposition: A | Discharge status: Skilled Nursing Facility | Attending: Internal Medicine | Admitting: Internal Medicine

## 2023-10-04 DIAGNOSIS — M25511 Pain in right shoulder: Secondary | ICD-10-CM | POA: Diagnosis not present

## 2023-10-04 DIAGNOSIS — R531 Weakness: Secondary | ICD-10-CM | POA: Insufficient documentation

## 2023-10-04 DIAGNOSIS — N200 Calculus of kidney: Secondary | ICD-10-CM | POA: Diagnosis not present

## 2023-10-04 DIAGNOSIS — I4811 Longstanding persistent atrial fibrillation: Secondary | ICD-10-CM | POA: Diagnosis not present

## 2023-10-04 DIAGNOSIS — R0989 Other specified symptoms and signs involving the circulatory and respiratory systems: Secondary | ICD-10-CM | POA: Diagnosis not present

## 2023-10-04 DIAGNOSIS — R2689 Other abnormalities of gait and mobility: Secondary | ICD-10-CM | POA: Insufficient documentation

## 2023-10-04 DIAGNOSIS — R7401 Elevation of levels of liver transaminase levels: Secondary | ICD-10-CM | POA: Diagnosis not present

## 2023-10-04 DIAGNOSIS — Z91148 Patient's other noncompliance with medication regimen for other reason: Secondary | ICD-10-CM | POA: Insufficient documentation

## 2023-10-04 DIAGNOSIS — M19011 Primary osteoarthritis, right shoulder: Secondary | ICD-10-CM | POA: Diagnosis not present

## 2023-10-04 DIAGNOSIS — R0602 Shortness of breath: Secondary | ICD-10-CM | POA: Diagnosis not present

## 2023-10-04 DIAGNOSIS — R4182 Altered mental status, unspecified: Principal | ICD-10-CM | POA: Insufficient documentation

## 2023-10-04 DIAGNOSIS — R1111 Vomiting without nausea: Secondary | ICD-10-CM | POA: Diagnosis not present

## 2023-10-04 DIAGNOSIS — R296 Repeated falls: Secondary | ICD-10-CM | POA: Diagnosis not present

## 2023-10-04 DIAGNOSIS — Z79899 Other long term (current) drug therapy: Secondary | ICD-10-CM | POA: Insufficient documentation

## 2023-10-04 DIAGNOSIS — N179 Acute kidney failure, unspecified: Secondary | ICD-10-CM | POA: Insufficient documentation

## 2023-10-04 DIAGNOSIS — I2510 Atherosclerotic heart disease of native coronary artery without angina pectoris: Secondary | ICD-10-CM | POA: Insufficient documentation

## 2023-10-04 DIAGNOSIS — I10 Essential (primary) hypertension: Secondary | ICD-10-CM | POA: Diagnosis not present

## 2023-10-04 DIAGNOSIS — W19XXXA Unspecified fall, initial encounter: Secondary | ICD-10-CM | POA: Diagnosis not present

## 2023-10-04 DIAGNOSIS — F0390 Unspecified dementia without behavioral disturbance: Secondary | ICD-10-CM | POA: Insufficient documentation

## 2023-10-04 DIAGNOSIS — M79603 Pain in arm, unspecified: Secondary | ICD-10-CM | POA: Diagnosis not present

## 2023-10-04 DIAGNOSIS — I6782 Cerebral ischemia: Secondary | ICD-10-CM | POA: Diagnosis not present

## 2023-10-04 DIAGNOSIS — Z7901 Long term (current) use of anticoagulants: Secondary | ICD-10-CM | POA: Diagnosis not present

## 2023-10-04 DIAGNOSIS — I517 Cardiomegaly: Secondary | ICD-10-CM | POA: Diagnosis not present

## 2023-10-04 HISTORY — DX: Urge incontinence: N39.41

## 2023-10-04 LAB — CBC WITH DIFFERENTIAL/PLATELET
Abs Immature Granulocytes: 0.06 K/uL (ref 0.00–0.07)
Basophils Absolute: 0.1 K/uL (ref 0.0–0.1)
Basophils Relative: 0 %
Eosinophils Absolute: 0 K/uL (ref 0.0–0.5)
Eosinophils Relative: 0 %
HCT: 45.9 % (ref 36.0–46.0)
Hemoglobin: 14.7 g/dL (ref 12.0–15.0)
Immature Granulocytes: 0 %
Lymphocytes Relative: 14 %
Lymphs Abs: 1.9 K/uL (ref 0.7–4.0)
MCH: 26.6 pg (ref 26.0–34.0)
MCHC: 32 g/dL (ref 30.0–36.0)
MCV: 83 fL (ref 80.0–100.0)
Monocytes Absolute: 1.3 K/uL — ABNORMAL HIGH (ref 0.1–1.0)
Monocytes Relative: 10 %
Neutro Abs: 10.1 K/uL — ABNORMAL HIGH (ref 1.7–7.7)
Neutrophils Relative %: 76 %
Platelets: 220 K/uL (ref 150–400)
RBC: 5.53 MIL/uL — ABNORMAL HIGH (ref 3.87–5.11)
RDW: 18.6 % — ABNORMAL HIGH (ref 11.5–15.5)
WBC: 13.2 K/uL — ABNORMAL HIGH (ref 4.0–10.5)
nRBC: 0 % (ref 0.0–0.2)

## 2023-10-04 LAB — URINALYSIS, W/ REFLEX TO CULTURE (INFECTION SUSPECTED)
Bacteria, UA: NONE SEEN
Bilirubin Urine: NEGATIVE
Glucose, UA: NEGATIVE mg/dL
Ketones, ur: 5 mg/dL — AB
Leukocytes,Ua: NEGATIVE
Nitrite: NEGATIVE
Protein, ur: NEGATIVE mg/dL
Specific Gravity, Urine: 1.017 (ref 1.005–1.030)
pH: 5 (ref 5.0–8.0)

## 2023-10-04 LAB — HEPATIC FUNCTION PANEL
ALT: 45 U/L — ABNORMAL HIGH (ref 0–44)
AST: 150 U/L — ABNORMAL HIGH (ref 15–41)
Albumin: 3.6 g/dL (ref 3.5–5.0)
Alkaline Phosphatase: 62 U/L (ref 38–126)
Bilirubin, Direct: 0.3 mg/dL — ABNORMAL HIGH (ref 0.0–0.2)
Indirect Bilirubin: 1.3 mg/dL — ABNORMAL HIGH (ref 0.3–0.9)
Total Bilirubin: 1.6 mg/dL — ABNORMAL HIGH (ref 0.0–1.2)
Total Protein: 7.4 g/dL (ref 6.5–8.1)

## 2023-10-04 LAB — BASIC METABOLIC PANEL WITH GFR
Anion gap: 15 (ref 5–15)
BUN: 53 mg/dL — ABNORMAL HIGH (ref 8–23)
CO2: 24 mmol/L (ref 22–32)
Calcium: 9.4 mg/dL (ref 8.9–10.3)
Chloride: 95 mmol/L — ABNORMAL LOW (ref 98–111)
Creatinine, Ser: 1.59 mg/dL — ABNORMAL HIGH (ref 0.44–1.00)
GFR, Estimated: 31 mL/min — ABNORMAL LOW (ref >60)
Glucose, Bld: 108 mg/dL — ABNORMAL HIGH (ref 70–99)
Potassium: 3.6 mmol/L (ref 3.5–5.1)
Sodium: 134 mmol/L — ABNORMAL LOW (ref 135–145)

## 2023-10-04 LAB — PROTIME-INR
INR: 1.3 — ABNORMAL HIGH (ref 0.8–1.2)
Prothrombin Time: 16.6 s — ABNORMAL HIGH (ref 11.4–15.2)

## 2023-10-04 LAB — TSH: TSH: 1.453 u[IU]/mL (ref 0.350–4.500)

## 2023-10-04 LAB — BRAIN NATRIURETIC PEPTIDE: B Natriuretic Peptide: 122.8 pg/mL — ABNORMAL HIGH (ref 0.0–100.0)

## 2023-10-04 LAB — CK: Total CK: 5991 U/L — ABNORMAL HIGH (ref 38–234)

## 2023-10-04 LAB — MAGNESIUM: Magnesium: 2 mg/dL (ref 1.7–2.4)

## 2023-10-04 MED ORDER — ACETAMINOPHEN 325 MG PO TABS: 650.0000 mg | ORAL_TABLET | Freq: Four times a day (QID) | ORAL | Status: DC | PRN

## 2023-10-04 MED ORDER — WARFARIN SODIUM 2.5 MG PO TABS
2.5000 mg | ORAL_TABLET | Freq: Once | ORAL | Status: AC
  Administered 2023-10-04: 2.5 mg via ORAL
  Filled 2023-10-04: qty 1

## 2023-10-04 MED ORDER — ONDANSETRON HCL 4 MG PO TABS: 4.0000 mg | ORAL_TABLET | Freq: Four times a day (QID) | ORAL | Status: DC | PRN

## 2023-10-04 MED ORDER — SODIUM CHLORIDE 0.9 % IV BOLUS
500.0000 mL | Freq: Once | INTRAVENOUS | Status: AC
  Administered 2023-10-04: 500 mL via INTRAVENOUS

## 2023-10-04 MED ORDER — ONDANSETRON HCL 4 MG/2ML IJ SOLN
4.0000 mg | Freq: Four times a day (QID) | INTRAMUSCULAR | Status: DC | PRN
Start: 2023-10-04 — End: 2023-10-07

## 2023-10-04 MED ORDER — CARVEDILOL 25 MG PO TABS
25.0000 mg | ORAL_TABLET | Freq: Two times a day (BID) | ORAL | Status: DC
  Administered 2023-10-04 – 2023-10-07 (×7): 25 mg via ORAL
  Filled 2023-10-04 (×2): qty 1
  Filled 2023-10-04: qty 4
  Filled 2023-10-04 (×4): qty 1

## 2023-10-04 MED ORDER — SODIUM CHLORIDE 0.9 % IV SOLN: INTRAVENOUS | Status: AC

## 2023-10-04 MED ORDER — WARFARIN - PHARMACIST DOSING INPATIENT
Freq: Every day | Status: DC
  Filled 2023-10-04: qty 1

## 2023-10-04 MED ORDER — ALPRAZOLAM 0.5 MG PO TABS: 0.5000 mg | ORAL_TABLET | Freq: Every evening | ORAL | Status: DC | PRN

## 2023-10-04 MED ORDER — IOHEXOL 350 MG/ML SOLN
50.0000 mL | Freq: Once | INTRAVENOUS | Status: AC | PRN
  Administered 2023-10-04: 50 mL via INTRAVENOUS

## 2023-10-04 MED ORDER — AMLODIPINE BESYLATE 10 MG PO TABS
10.0000 mg | ORAL_TABLET | Freq: Every day | ORAL | Status: DC
  Administered 2023-10-04 – 2023-10-07 (×4): 10 mg via ORAL
  Filled 2023-10-04: qty 2
  Filled 2023-10-04 (×3): qty 1

## 2023-10-04 MED ORDER — ACETAMINOPHEN 650 MG RE SUPP: 650.0000 mg | Freq: Four times a day (QID) | RECTAL | Status: DC | PRN

## 2023-10-04 MED ORDER — LIDOCAINE 5 % EX PTCH
1.0000 | MEDICATED_PATCH | CUTANEOUS | Status: DC
  Administered 2023-10-04 – 2023-10-06 (×3): 1 via TRANSDERMAL
  Filled 2023-10-04 (×4): qty 1

## 2023-10-04 MED ORDER — GABAPENTIN 300 MG PO CAPS
300.0000 mg | ORAL_CAPSULE | Freq: Three times a day (TID) | ORAL | Status: DC
  Administered 2023-10-04 – 2023-10-07 (×9): 300 mg via ORAL
  Filled 2023-10-04 (×9): qty 1

## 2023-10-04 NOTE — Consult Note (Signed)
 PHARMACY - ANTICOAGULATION CONSULT NOTE  Pharmacy Consult for Warfarin Indication: atrial fibrillation  Allergies  Allergen Reactions   Penicillins Rash    Has patient had a PCN reaction causing immediate rash, facial/tongue/throat swelling, SOB or lightheadedness with hypotension: Unknown Has patient had a PCN reaction causing severe rash involving mucus membranes or skin necrosis: Unknown Has patient had a PCN reaction that required hospitalization: Unknown Has patient had a PCN reaction occurring within the last 10 years: Unknown If all of the above answers are NO, then may proceed with Cephalosporin use.     Patient Measurements: Height: 5' 2 (157.5 cm) Weight: 62.1 kg (137 lb) IBW/kg (Calculated) : 50.1 HEPARIN DW (KG): 62.1  Vital Signs: Temp: 98.3 F (36.8 C) (07/15 0803) Temp Source: Oral (07/15 0803) BP: 171/98 (07/15 0900) Pulse Rate: 95 (07/15 0900)  Labs: Recent Labs    10/04/23 0810  HGB 14.7  HCT 45.9  PLT 220  LABPROT 16.6*  INR 1.3*  CREATININE 1.59*    Estimated Creatinine Clearance: 20.8 mL/min (A) (by C-G formula based on SCr of 1.59 mg/dL (H)).   Medical History: Past Medical History:  Diagnosis Date   Arthritis    Atrial fibrillation (HCC)    Hypertension     Pertinent Medications:  New dosing regimen implemented on 09/27/2023: Warfarin 2.5mg  Wednesdays, and 1.25mg  all other days Last dose: unknown  Assessment: 89 y.o. female with medical history significant of PAF on Coumadin , HTN, multiple always presented with worsening of right shoulder pain, ambulation difficulties. Per patient, fell on her left side on Saturday and hit her head, but no loss of consciousness and decided to stop taking warfarin because of this until she saw her PCP. She can't remember when her last dose was, but knows she didn't take any warfarin this past weekend. Pharmacy has been consulted to re-initiate medication. See above for new dosing schedule per PCP Dr.  Vande.    Goal of Therapy:  INR 2-3 Monitor platelets by anticoagulation protocol: Yes  Date:  INR: Dose:  7/15 1.3    Plan:  Will order warfarin 2.5mg  x 1 to be given today Continue to monitor INR and adjust dose as appropriate CBC/H&H daily  Natalio Salois A Levelle Edelen 10/04/2023,12:52 PM

## 2023-10-04 NOTE — ED Provider Notes (Signed)
 [facility] Provider Note    Event Date/Time   First MD Initiated Contact with Patient 10/04/23 [phone]     (approximate)   History   Weakness   HPI  Cassandra Weiss is a 89 y.o. female with a past medical history of atrial fibrillation (although Coumadin  is at home medication list patient denies taking this), mild dementia on donezepil, coronary artery disease, hypertension, hyperlipidemia who presents to the emergency department with 4 days of progressively worsening shortness of breath generalized weakness.  Patient did have a fall from standing on Saturday.  Since then she has been sitting in her recliner and has not been able to get up even for bathroom functions.  Patient's only complaint is right shoulder pain and shortness of breath.  Denies any headache, chest pain, abdominal pain or pain in any other extremity except for her right shoulder from the fall.  She states that she does not take Coumadin  although this is listed as a medication from her last internal medicine.  Her family is not at bedside at this time     Physical Exam   Triage Vital Signs: ED Triage Vitals  Encounter Vitals Group     BP 10/04/23 0803 (!) 175/98     Girls Systolic BP Percentile --      Girls Diastolic BP Percentile --      Boys Systolic BP Percentile --      Boys Diastolic BP Percentile --      Pulse Rate 10/04/23 0803 (!) 108     Resp 10/04/23 0803 (!) 29     Temp 10/04/23 0803 98.3 F (36.8 C)     Temp Source 10/04/23 0803 Oral     SpO2 10/04/23 0802 98 %     Weight 10/04/23 0806 137 lb (62.1 kg)     Height 10/04/23 0806 5' 2 (1.575 m)     Head Circumference --      Peak Flow --      Pain Score --      Pain Loc --      Pain Education --      Exclude from Growth Chart --     Most recent vital signs: Vitals:   10/04/23 0830 10/04/23 0900  BP: (!) 187/105 (!) 171/98  Pulse: (!) 104 95  Resp: 14 19  Temp:    SpO2: 99% 100%    Nursing Triage Note reviewed.  Vital signs reviewed and patients oxygen saturation is normoxic General: Patient is well nourished, well developed, awake and alert, appears chronically ill  head: Normocephalic and atraumatic Eyes: Normal inspection, extraocular muscles intact, no conjunctival pallor Ear, nose, throat: Normal external exam except for an abrasion over the left cheek Neck: Normal range of motion Respiratory: Patient is in mild respiratory distress, lungs mild rales Cardiovascular: Patient is borderline tachycardic, irregular rate GI: Abd SNT with no guarding or rebound  Back: Normal inspection of the back with good strength and range of motion throughout all ext Extremities: pulses intact with good cap refills, no LE pitting edema or calf tenderness Neuro: The patient is alert and oriented to person, place, and time, appropriately conversive, with 5/5 bilat UE/LE strength, no gross motor or sensory defects noted. Coordination appears to be adequate. Skin: Warm, dry, and intact Psych: normal mood and affect, no SI or HI   ED Results / Procedures / Treatments   Labs (all labs ordered are listed, but only abnormal results are displayed) Labs Reviewed  BASIC METABOLIC PANEL WITH GFR - Abnormal; Notable for the following components:      Result Value   Sodium 134 (*)    Chloride 95 (*)    Glucose, Bld 108 (*)    BUN 53 (*)    Creatinine, Ser 1.59 (*)    GFR, Estimated 31 (*)    All other components within normal limits  HEPATIC FUNCTION PANEL - Abnormal; Notable for the following components:   AST 150 (*)    ALT 45 (*)    Total Bilirubin 1.6 (*)    Bilirubin, Direct 0.3 (*)    Indirect Bilirubin 1.3 (*)    All other components within normal limits  CBC WITH DIFFERENTIAL/PLATELET - Abnormal; Notable for the following components:   WBC 13.2 (*)    RBC 5.53 (*)    RDW 18.6 (*)    Neutro Abs 10.1 (*)    Monocytes Absolute 1.3 (*)    All other components within normal limits  BRAIN NATRIURETIC  PEPTIDE - Abnormal; Notable for the following components:   B Natriuretic Peptide 122.8 (*)    All other components within normal limits  PROTIME-INR - Abnormal; Notable for the following components:   Prothrombin Time 16.6 (*)    INR 1.3 (*)    All other components within normal limits  MAGNESIUM  TSH  URINALYSIS, W/ REFLEX TO CULTURE (INFECTION SUSPECTED)  CK     EKG  EKG and rhythm strip are interpreted by myself at: 10/04/2023 8:40 AM EKG: afib at heart rate of 100, normal QRS duration, QTc 4189, nonspecific ST changes no ectopy? EKG not consistent with Acute STEMI Rhythm Strip: Atrial fibrillation in lead II, no old to compare    RADIOLOGY CT head: No intracranial hemorrhage CT angio PE: No pulmonary embolism    PROCEDURES:  Critical Care performed: No  Procedures   MEDICATIONS ORDERED IN ED: Medications  sodium chloride  0.9 % bolus 500 mL (has no administration in time range)  iohexol  (OMNIPAQUE ) 350 MG/ML injection 50 mL (50 mLs Intravenous Contrast Given 10/04/23 0951)     IMPRESSION / MDM / ASSESSMENT AND PLAN / ED COURSE  I reviewed the triage vital signs and the nursing notes.                              Clinical Course as of 10/04/23 1045  Tue Oct 04, 2023  9158 Portable chest x-ray reviewed and I do see no significant pneumothorax or large pleural edema [HD]  0927 Creatinine(!): 1.59 Baseline creatinine is 1.03 [HD]  0928 INR(!): 1.3 Does not seem to be taking the Coumadin  currently [HD]  0929 Given her tachycardia and slightly elevated, creatinine, will give a small bolus of IV fluids [HD]    Clinical Course User Index [HD] Nicholaus Rolland BRAVO, MD   Summary: 89 year old female who presents after a fall and worsening generalized weakness and atrial fibrillation  Differential diagnosis includes, but is not limited to, intracranial hemorrhage, pulmonary embolism, pulmonary edema, arrhythmia, electrolyte derangement, UTI  ED course: Patient  arrives acutely without any focal neurological deficits.  She does have evidence of head trauma so CT head was ordered which I independently viewed and interpreted which demonstrated no intracranial hemorrhage.  Patient's EKG was consistent with atrial fibrillation however this did become rate controlled without any indications with a small amount of IV fluid.  CT PE without evidence of pulmonary embolism.  Urinalysis is currently pending  however there was some concern for UTI.  She does have a leukocytosis but is not hypotensive.  Case discussed with hospitalist for admission  -- Data(2/3 categories following were performed): I reviewed or ordered at least three unique tests, external notes, and/or the history required an independent historian as one of the three requirements as following: At least 3 labs/imaging studies were obtained and/or reviewed. AND  I discussed the management of the patient with the following external physician or qualified healthcare provider: Admitting physician  Risk: This patient has a high risk of morbidity due to further diagnostic testing or treatment. Rationale: Decision made regarding admission  Admit Level 5 - Labs/Rads, Admit, Consult: None  Suggested E/M Coding Level: 5, 99285  This level has been selected based on the 11-11-2021 CPT guidelines for E/M codes in the Emergency Department based on 2/3 of the CoPA, Data, and Risk.    Clinical Course as of 10/04/23 1045  Tue Oct 04, 2023  9158 Portable chest x-ray reviewed and I do see no significant pneumothorax or large pleural edema [HD]  0927 Creatinine(!): 1.59 Baseline creatinine is 1.03 [HD]  0928 INR(!): 1.3 Does not seem to be taking the Coumadin  currently [HD]  0929 Given her tachycardia and slightly elevated, creatinine, will give a small bolus of IV fluids [HD]    Clinical Course User Index [HD] Nicholaus Rolland BRAVO, MD     FINAL CLINICAL IMPRESSION(S) / ED DIAGNOSES   Final diagnoses:   Longstanding persistent atrial fibrillation (HCC)  General weakness  Fall from standing, initial encounter  Medication non-compliance due to excessive pill burden  Shortness of breath     Rx / DC Orders   ED Discharge Orders     None        Note:  This document was prepared using Dragon voice recognition software and may include unintentional dictation errors.   Nicholaus Rolland BRAVO, MD 10/04/23 1045

## 2023-10-04 NOTE — H&P (Signed)
 History and Physical    Cassandra Weiss FMW:[id number] DOB: 1932-06-26 DOA: 10/04/2023  PCP: Sadie Manna, MD (Confirm with patient/family/NH records and if not entered, this has to be entered at [facility] point of entry) Patient coming from: Home  I have personally briefly reviewed patient's old medical records in [facility]  Chief Complaint: Feeling weak, right shoulder pain  HPI: Cassandra Weiss is a 89 y.o. female with medical history significant of PAF on Coumadin , HTN, multiple always presented with worsening of right shoulder pain, ambulation difficulties.  Patient has had chronic ambulation dysfunction, uses a cane to ambulate.  Her ambulation status has deteriorated and recently husband has purchased a old record for her to use.  Patient however refused to use wheeled walker and just limping on with the cane.  Saturday she fell on the left side hit her head no LOC denied any prodromes of lightheadedness chest pain palpitations.  Since then she has been seen in the recliner the whole week and refused to ambulate.  Patient complaining about severe right shoulder pain,  unable to use cane anymore because of  severe right shoulder pain when moving right arm.  Denies any urinary symptoms no cough no diarrhea.  ED Course: Afebrile, borderline tachycardia blood pressure 170/90 O2 saturation 100% on room air.  Blood work showed WBC 13 hemoglobin 14.7 BUN 53 creatinine 1.5 compared to baseline 0.8-3.9 bicarb 24K 3.6 AST 150 ALT 48.  CTA abdomen pelvis with contrast showed no acute findings, CT head negative for acute finding x-ray showed severe right shoulder OA.  Review of Systems: As per HPI otherwise 14 point review of systems negative.    Past Medical History:  Diagnosis Date   Arthritis    Atrial fibrillation (HCC)    Hypertension     No past surgical history on file.   reports that she has never smoked. She has never used smokeless tobacco. She reports that she does not drink  alcohol . No history on file for drug use.  Allergies  Allergen Reactions   Penicillins Rash    Has patient had a PCN reaction causing immediate rash, facial/tongue/throat swelling, SOB or lightheadedness with hypotension: Unknown Has patient had a PCN reaction causing severe rash involving mucus membranes or skin necrosis: Unknown Has patient had a PCN reaction that required hospitalization: Unknown Has patient had a PCN reaction occurring within the last 10 years: Unknown If all of the above answers are NO, then may proceed with Cephalosporin use.     Family History  Problem Relation Age of Onset   Heart disease Mother    Stroke Mother    Heart disease Father      Prior to Admission medications   Medication Sig Start Date End Date Taking? Authorizing Provider  amLODipine  (NORVASC ) 10 MG tablet Take 10 mg by mouth daily. 12/26/17  Yes [provider]  atorvastatin  (LIPITOR) 80 MG tablet Take 80 mg by mouth daily. 12/14/17  Yes [provider]  doxycycline (VIBRA-TABS) 100 MG tablet Take 100 mg by mouth 2 (two) times daily. 08/17/23  Yes [provider]  gabapentin  (NEURONTIN ) 300 MG capsule Take by mouth.  1 (one) Capsule Capsule three times a day 01/14/14  Yes [provider]  hydrochlorothiazide (HYDRODIURIL) 25 MG tablet Take by mouth. 25MG  (Oral Tablet) 1 (one) Tablet Tablet daily 05/07/13  Yes [provider]  losartan  (COZAAR ) 100 MG tablet Take 100 mg by mouth daily. 12/12/17  Yes [provider]  Multiple  Vitamin (MULTI-VITAMIN) tablet Take 1 tablet by mouth daily.   Yes [provider]  simvastatin (ZOCOR) 20 MG tablet Take by mouth.  1 Tablet at bedtime 04/23/13  Yes [provider]  ALPRAZolam  (XANAX ) 0.5 MG tablet Take 0.5 mg by mouth at bedtime as needed for sleep. 12/26/17   [provider]  carvedilol  (COREG ) 25 MG tablet Take 25 mg by mouth 2 (two) times daily. 01/05/18   [provider]  cefdinir  (OMNICEF ) 300 MG capsule Take 1 capsule (300 mg total) by mouth 2 (two) times daily. 01/15/18   Salary, Montell D, MD  furosemide (LASIX) 20 MG tablet Take by mouth.  1 tablet daily    [provider]  warfarin (COUMADIN ) 2.5 MG tablet Take 2.5 mg by mouth daily. 12/30/17   [provider]    Physical Exam: Vitals:   10/04/23 0803 10/04/23 0806 10/04/23 0830 10/04/23 0900  BP: (!) 175/98  (!) 187/105 (!) 171/98  Pulse: (!) 108  (!) 104 95  Resp: (!) 29  14 19   Temp: 98.3 F (36.8 C)     TempSrc: Oral     SpO2: 97%  99% 100%  Weight:  62.1 kg    Height:  5' 2 (1.575 m)      Constitutional: NAD, calm, comfortable Vitals:   10/04/23 0803 10/04/23 0806 10/04/23 0830 10/04/23 0900  BP: (!) 175/98  (!) 187/105 (!) 171/98  Pulse: (!) 108  (!) 104 95  Resp: (!) 29  14 19   Temp: 98.3 F (36.8 C)     TempSrc: Oral     SpO2: 97%  99% 100%  Weight:  62.1 kg    Height:  5' 2 (1.575 m)     Eyes: PERRL, lids and conjunctivae normal ENMT: Mucous membranes are moist. Posterior pharynx clear of any exudate or lesions.Normal dentition.  Neck: normal, supple, no masses, no thyromegaly Respiratory: clear to auscultation bilaterally, no wheezing, no crackles. Normal respiratory effort. No accessory muscle use.  Cardiovascular: Regular rate and rhythm, no murmurs / rubs / gallops. No extremity edema. 2+ pedal pulses. No carotid bruits.  Abdomen: no tenderness, no masses palpated. No hepatosplenomegaly. Bowel sounds positive.  Musculoskeletal: Severe right shoulder tenderness with decreased ROM Skin: no rashes, lesions, ulcers. No induration Neurologic: CN 2-12 grossly intact. Sensation intact, DTR normal.  Strength 4/5 on bilateral lower extremities below the hips Psychiatric: Normal judgment and insight. Alert and oriented x 3. Normal mood.     Labs on Admission: I have personally reviewed following labs and imaging studies  CBC: Recent Labs  Lab  10/04/23 0810  WBC 13.2*  NEUTROABS 10.1*  HGB 14.7  HCT 45.9  MCV 83.0  PLT 220   Basic Metabolic Panel: Recent Labs  Lab 10/04/23 0810  NA 134*  K 3.6  CL 95*  CO2 24  GLUCOSE 108*  BUN 53*  CREATININE 1.59*  CALCIUM  9.4  MG 2.0   GFR: Estimated Creatinine Clearance: 20.8 mL/min (A) (by C-G formula based on SCr of 1.59 mg/dL (H)). Liver Function Tests: Recent Labs  Lab 10/04/23 0810  AST 150*  ALT 45*  ALKPHOS 62  BILITOT 1.6*  PROT 7.4  ALBUMIN 3.6   No results for input(s): LIPASE, AMYLASE in the last 168 hours. No results for input(s): AMMONIA in the last 168 hours. Coagulation Profile: Recent Labs  Lab 10/04/23 0810  INR 1.3*   Cardiac Enzymes: No results for input(s): CKTOTAL, CKMB, CKMBINDEX, TROPONINI in the last  168 hours. BNP (last 3 results) No results for input(s): PROBNP in the last 8760 hours. HbA1C: No results for input(s): HGBA1C in the last 72 hours. CBG: No results for input(s): GLUCAP in the last 168 hours. Lipid Profile: No results for input(s): CHOL, HDL, LDLCALC, TRIG, CHOLHDL, LDLDIRECT in the last 72 hours. Thyroid  Function Tests: Recent Labs    10/04/23 0810  TSH 1.453   Anemia Panel: No results for input(s): VITAMINB12, FOLATE, FERRITIN, TIBC, IRON, RETICCTPCT in the last 72 hours. Urine analysis:    Component Value Date/Time   COLORURINE YELLOW (A) 01/13/2018 1732   APPEARANCEUR HAZY (A) 01/13/2018 1732   APPEARANCEUR Cloudy 07/11/2011 2145   LABSPEC 1.012 01/13/2018 1732   LABSPEC 1.008 07/11/2011 2145   PHURINE 6.0 01/13/2018 1732   GLUCOSEU NEGATIVE 01/13/2018 1732   GLUCOSEU Negative 07/11/2011 2145   HGBUR MODERATE (A) 01/13/2018 1732   BILIRUBINUR NEGATIVE 01/13/2018 1732   BILIRUBINUR Negative 07/11/2011 2145   KETONESUR NEGATIVE 01/13/2018 1732   PROTEINUR NEGATIVE 01/13/2018 1732   NITRITE NEGATIVE 01/13/2018 1732   LEUKOCYTESUR NEGATIVE 01/13/2018 1732    LEUKOCYTESUR Trace 07/11/2011 2145    Radiological Exams on Admission: CT Angio Chest PE W and/or Wo Contrast Result Date: 10/04/2023 CLINICAL DATA:  89 year old female with weakness, fall from bed 2 or 3 days ago. Atrial fibrillation. Shortness of breath. EXAM: CT ANGIOGRAPHY CHEST WITH CONTRAST TECHNIQUE: Multidetector CT imaging of the chest was performed using the standard protocol during bolus administration of intravenous contrast. Multiplanar CT image reconstructions and MIPs were obtained to evaluate the vascular anatomy. RADIATION DOSE REDUCTION: This exam was performed according to the departmental dose-optimization program which includes automated exposure control, adjustment of the mA and/or kV according to patient size and/or use of iterative reconstruction technique. CONTRAST:  50mL OMNIPAQUE  IOHEXOL  350 MG/ML SOLN COMPARISON:  CTA chest 02/14/2015. FINDINGS: Cardiovascular: Good contrast bolus timing in the pulmonary arterial tree. Middle and lower lobe respiratory motion obscuring detail of segmental and distal pulmonary arteries. Otherwise no pulmonary artery filling defect identified. No contrast in the aorta. Calcified aortic atherosclerosis. Cardiomegaly. No pericardial effusion. Calcified coronary artery atherosclerosis. Mediastinum/Nodes: Negative. No mediastinal mass or lymphadenopathy. Lungs/Pleura: Lower lung volumes compared to 2016. Major airways are patent. Mild dependent and lung base scarring or atelectasis. No pleural effusion, consolidation, or convincing active lung inflammation. Upper Abdomen: Left nephrolithiasis. Partially visible hyperdense renal cysts (no follow-up imaging recommended in the setting of significant comorbidities or limited life expectancy). Negative other visible noncontrast upper abdominal viscera. Musculoskeletal: No acute or suspicious osseous lesion. Review of the MIP images confirms the above findings. IMPRESSION: 1. No pulmonary embolus identified;  respiratory motion obscures distal arteries at the lung bases. 2. Cardiomegaly. Calcified coronary artery and Aortic Atherosclerosis (ICD10-I70.0). 3. No acute or inflammatory pulmonary process. 4. Left nephrolithiasis. Electronically Signed   By: VEAR Hurst M.D.   On: 10/04/2023 10:18   CT Head Wo Contrast Result Date: 10/04/2023 CLINICAL DATA:  89 year old female with weakness, fall from bed 2 or 3 days ago. Atrial fibrillation. Shortness of breath. EXAM: CT HEAD WITHOUT CONTRAST TECHNIQUE: Contiguous axial images were obtained from the base of the skull through the vertex without intravenous contrast. RADIATION DOSE REDUCTION: This exam was performed according to the departmental dose-optimization program which includes automated exposure control, adjustment of the mA and/or kV according to patient size and/or use of iterative reconstruction technique. COMPARISON:  Brain MRI 01/14/2018.  Head CT 01/13/2018. FINDINGS: Brain: Cerebral volume remains normal for age. No  midline shift, ventriculomegaly, mass effect, evidence of mass lesion, intracranial hemorrhage or evidence of cortically based acute infarction. Patchy mild to moderate cerebral white matter hypodensity and associated deep gray nuclei heterogeneity not significantly changed. 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 Vascular: Calcified atherosclerosis at the skull base. No suspicious intracranial vascular hyperdensity. Skull: Stable.  No acute osseous abnormality identified. Sinuses/Orbits: Visualized paranasal sinuses and mastoids are stable and well aerated. Other: No acute orbit or scalp soft tissue finding identified. IMPRESSION: 1. No acute intracranial abnormality or acute traumatic injury identified. 2. Stable non contrast CT appearance of chronic small vessel disease since 2019. Electronically Signed   By: VEAR Hurst M.D.   On: 10/04/2023 10:13   DG Chest Portable 1 View Result Date: 10/04/2023 CLINICAL DATA:   Shortness of breath EXAM: PORTABLE CHEST 1 VIEW COMPARISON:  Chest radiograph dated 01/13/2018 FINDINGS: Low lung volumes with bronchovascular crowding. No focal consolidations. No pleural effusion or pneumothorax. Similar enlarged cardiomediastinal silhouette. Degenerative changes of the bilateral shoulders. No radiographic finding of acute displaced fracture. IMPRESSION: Low lung volumes with bronchovascular crowding. No focal consolidations. Electronically Signed   By: Limin  Xu M.D.   On: 10/04/2023 08:49   DG Shoulder Right Result Date: 10/04/2023 CLINICAL DATA:  Weakness and shoulder pain after a fall EXAM: RIGHT SHOULDER - 2+ VIEW COMPARISON:  None Available. FINDINGS: Visualized portion of the right hemithorax is normal. No acute fracture or dislocation. Advanced osteoarthritis about the glenohumeral joint with marked joint space narrowing and prominent osteophytes inferiorly. More mild acromioclavicular joint degenerative change. IMPRESSION: Advanced osteoarthritis, without acute osseous finding. Electronically Signed   By: Rockey Kilts M.D.   On: 10/04/2023 08:49    EKG: Independently reviewed.  A-fib no RVR  Assessment/Plan Principal Problem:   AMS (altered mental status) Active Problems:   Altered mental status   AKI (acute kidney injury) (HCC)  (please populate well all problems here in Problem List. (For example, if patient is on BP meds at home and you resume or decide to hold them, it is a problem that needs to be her. Same for CAD, COPD, HLD and so on)  Acute on chronic ambulation impairment Frequent falls - No focal neurological deficit - Part of the problem is from severe multiple especially severe right shoulder OA.  Continue current pain management regimen - PT evaluation  AKI - Probably prerenal secondary to poor oral intake CT abdomen pelvis negative for-obstructive uropathy - IV fluid then reevaluate  Transaminitis -No RUQ symptoms, CT abdomen showed no acute  findings- -check CK level - Trend LFTs -Hold off statin  HTN, uncontrolled - Resume home BP meds including amlodipine  and Coreg  - Hold off HCTZ and lisinopril given there is a kidney function surge  PAF - Rate controlled - Continue Coreg  - Consult pharmacy for Coumadin  redosing  DVT prophylaxis: Coumadin  Code Status: Full code Family Communication: Husband at bedside Disposition Plan: Expect less than 2 midnight hospital stay Consults called: None Admission status: Telemetry Observation  Cort ONEIDA Mana MD [facility] Pager [phone]  10/04/2023, 12:49 PM

## 2023-10-04 NOTE — ED Triage Notes (Signed)
 Pt arrives via POV with [facility] from home with c/o weakness since Saturday after a fall out of the bed Saturday night/Sunday morning. Per EMS, pt was checked out by EMS after the fall Sunday morning but refused to come to the ED. EMS found pt today in a chair, with the same clothes on from Sunday covered in urine and dried vomit. Pt c/o right arm pain, has visible abrasions to the left side of their face. Pt is A&Ox4 during triage but is a poor historian with medical history/medications.

## 2023-10-04 NOTE — ED Notes (Signed)
 Called CCMD to add to monitoring

## 2023-10-04 NOTE — Progress Notes (Signed)
   10/04/23 0900  Spiritual Encounters  Type of Visit Initial  Care provided to: Pt and family  Referral source Chaplain assessment  Reason for visit Routine spiritual support  Interventions  Spiritual Care Interventions Made Established relationship of care and support  Intervention Outcomes  Outcomes Connection to spiritual care  Spiritual Care Plan  Spiritual Care Issues Still Outstanding No further spiritual care needs at this time (see row info)   Chaplain was doing ED rounding when pt's son, [name], asked for a warm blanket which chaplain got for [place]. Chaplain then stepped into the room to see if there was anything else she could do for the family and pt said she was fine, but her husband, [name], asked if he could have a warm blanket and a pillow to sit one. Chaplain took care of both and let them know that chaplain services are available 24/7

## 2023-10-05 ENCOUNTER — Observation Stay

## 2023-10-05 DIAGNOSIS — M25411 Effusion, right shoulder: Secondary | ICD-10-CM | POA: Diagnosis not present

## 2023-10-05 DIAGNOSIS — S43431A Superior glenoid labrum lesion of right shoulder, initial encounter: Secondary | ICD-10-CM | POA: Diagnosis not present

## 2023-10-05 DIAGNOSIS — G9341 Metabolic encephalopathy: Secondary | ICD-10-CM | POA: Diagnosis not present

## 2023-10-05 DIAGNOSIS — R531 Weakness: Secondary | ICD-10-CM | POA: Diagnosis not present

## 2023-10-05 DIAGNOSIS — M25511 Pain in right shoulder: Secondary | ICD-10-CM | POA: Diagnosis not present

## 2023-10-05 LAB — COMPREHENSIVE METABOLIC PANEL WITH GFR
ALT: 33 U/L (ref 0–44)
AST: 86 U/L — ABNORMAL HIGH (ref 15–41)
Albumin: 2.6 g/dL — ABNORMAL LOW (ref 3.5–5.0)
Alkaline Phosphatase: 47 U/L (ref 38–126)
Anion gap: 8 (ref 5–15)
BUN: 39 mg/dL — ABNORMAL HIGH (ref 8–23)
CO2: 24 mmol/L (ref 22–32)
Calcium: 8.5 mg/dL — ABNORMAL LOW (ref 8.9–10.3)
Chloride: 106 mmol/L (ref 98–111)
Creatinine, Ser: 1.07 mg/dL — ABNORMAL HIGH (ref 0.44–1.00)
GFR, Estimated: 50 mL/min — ABNORMAL LOW (ref >60)
Glucose, Bld: 115 mg/dL — ABNORMAL HIGH (ref 70–99)
Potassium: 3.2 mmol/L — ABNORMAL LOW (ref 3.5–5.1)
Sodium: 138 mmol/L (ref 135–145)
Total Bilirubin: 1.7 mg/dL — ABNORMAL HIGH (ref 0.0–1.2)
Total Protein: 5.8 g/dL — ABNORMAL LOW (ref 6.5–8.1)

## 2023-10-05 LAB — CBC
HCT: 38.2 % (ref 36.0–46.0)
Hemoglobin: 12.3 g/dL (ref 12.0–15.0)
MCH: 26.6 pg (ref 26.0–34.0)
MCHC: 32.2 g/dL (ref 30.0–36.0)
MCV: 82.7 fL (ref 80.0–100.0)
Platelets: 188 K/uL (ref 150–400)
RBC: 4.62 MIL/uL (ref 3.87–5.11)
RDW: 17.4 % — ABNORMAL HIGH (ref 11.5–15.5)
WBC: 10.5 K/uL (ref 4.0–10.5)
nRBC: 0 % (ref 0.0–0.2)

## 2023-10-05 LAB — PROTIME-INR
INR: 1.6 — ABNORMAL HIGH (ref 0.8–1.2)
Prothrombin Time: 19.9 s — ABNORMAL HIGH (ref 11.4–15.2)

## 2023-10-05 MED ORDER — POTASSIUM CHLORIDE 20 MEQ PO PACK
40.0000 meq | PACK | Freq: Once | ORAL | Status: AC
  Administered 2023-10-05: 40 meq via ORAL
  Filled 2023-10-05: qty 2

## 2023-10-05 MED ORDER — ACETAMINOPHEN 325 MG PO TABS: 650.0000 mg | ORAL_TABLET | Freq: Four times a day (QID) | ORAL | 0 refills | Status: AC | PRN

## 2023-10-05 MED ORDER — HYDRALAZINE HCL 25 MG PO TABS: 50.0000 mg | ORAL_TABLET | Freq: Three times a day (TID) | ORAL | 0 refills | Status: DC

## 2023-10-05 MED ORDER — WARFARIN SODIUM 2 MG PO TABS
2.0000 mg | ORAL_TABLET | Freq: Once | ORAL | Status: AC
  Administered 2023-10-05: 2 mg via ORAL
  Filled 2023-10-05: qty 1

## 2023-10-05 MED ORDER — ENSURE PLUS HIGH PROTEIN PO LIQD
237.0000 mL | Freq: Two times a day (BID) | ORAL | Status: DC
  Administered 2023-10-06 – 2023-10-07 (×2): 237 mL via ORAL

## 2023-10-05 MED ORDER — ZIPRASIDONE MESYLATE 20 MG IM SOLR
10.0000 mg | Freq: Once | INTRAMUSCULAR | Status: AC
  Administered 2023-10-05: 10 mg via INTRAMUSCULAR
  Filled 2023-10-05: qty 20

## 2023-10-05 NOTE — Progress Notes (Signed)
 Progress Note   Patient: Cassandra Weiss FMW:[id number] DOB: 08-11-1932 DOA: 10/04/2023     0 DOS: the patient was seen and examined on 10/05/2023   Brief hospital course: RAND BOLLER is a 89 y.o. female with medical history significant of PAF on Coumadin , HTN, multiple always presented with worsening of right shoulder pain, ambulation difficulties.   Patient has had chronic ambulation dysfunction, uses a cane to ambulate.  Her ambulation status has deteriorated and recently husband has purchased a old record for her to use.  Patient however refused to use wheeled walker and just limping on with the cane.  Saturday she fell on the left side hit her head no LOC denied any prodromes of lightheadedness chest pain palpitations.  Since then she has been seen in the recliner the whole week and refused to ambulate.  Patient complaining about severe right shoulder pain,  unable to use cane anymore because of  severe right shoulder pain when moving right arm.  Denies any urinary symptoms no cough no diarrhea.   ED Course: Afebrile, borderline tachycardia blood pressure 170/90 O2 saturation 100% on room air.  Blood work showed WBC 13 hemoglobin 14.7 BUN 53 creatinine 1.5 compared to baseline 0.8-3.9 bicarb 24K 3.6 AST 150 ALT 48.  CTA abdomen pelvis with contrast showed no acute findings, CT head negative for acute finding x-ray showed severe right shoulder OA.    Assessment and Plan:   Acute on chronic ambulation impairment Frequent falls - No focal neurological deficit - Part of the problem is from severe multiple especially severe right shoulder OA.  Continue current pain management regimen - PT evaluation   Acute renal failure secondary to poor oral intake CT abdomen pelvis negative for-obstructive uropathy Continue IV fluid Monitor renal function closely   Transaminitis -No RUQ symptoms, CT abdomen showed no acute findings- Continue to hold statin therapy Monitor liver function tests    HTN, uncontrolled - Resume home BP meds including amlodipine  and Coreg  - Hold off HCTZ and lisinopril given there is a kidney function surge   PAF - Rate controlled Continue Coreg  - Consult pharmacy for Coumadin  redosing   DVT prophylaxis: Coumadin   Code Status: Full code  Family Communication: Husband at bedside  Disposition Plan: Expect less than 2 midnight hospital stay  Consults called: None  Admission status: Telemetry  Subjective:  Patient seen and examined at bedside this morning Denies nausea vomiting abdominal pain chest pain cough   Physical Exam:  Eyes: PERRL, lids and conjunctivae normal ENMT: Mucous membranes are moist. Posterior pharynx clear of any exudate or lesions.Normal dentition.  Neck: normal, supple, no masses, no thyromegaly Respiratory: clear to auscultation bilaterally, no wheezing, no crackles. Normal respiratory effort. No accessory muscle use.  Cardiovascular: Regular rate and rhythm, no murmurs / rubs / gallops. No extremity edema. 2+ pedal pulses. No carotid bruits.  Abdomen: no tenderness, no masses palpated. No hepatosplenomegaly. Bowel sounds positive.  Musculoskeletal: Severe right shoulder tenderness with decreased ROM Skin: no rashes, lesions, ulcers. No induration Neurologic: CN 2-12 grossly intact. Sensation intact, DTR normal.  Strength 4/5 on bilateral lower extremities below the hips Psychiatric: Normal judgment and insight. Alert and oriented x 3. Normal mood.   Data reviewed: X-ray of the show that showed severe osteoarthritis Plan of care discussed with TOC as well as PT OT with recommendation for skilled nursing facility placement Vitals:   10/05/23 0023 10/05/23 0508 10/05/23 0741 10/05/23 1220  BP: (!) 107/47 116/69 (!) 125/55 (!) 108/45  Pulse: 70 68 62 63  Resp: 18 18 16 18   Temp: 97.7 F (36.5 C) 98.2 F (36.8 C) 98.1 F (36.7 C) 98.1 F (36.7 C)  TempSrc:      SpO2: 94% 100% 97% 95%  Weight:      Height:           Latest Ref Rng & Units 10/05/2023    3:45 AM 10/04/2023    8:10 AM 01/13/2018    3:29 PM  CBC  WBC 4.0 - 10.5 K/uL 10.5  13.2  7.8   Hemoglobin 12.0 - 15.0 g/dL 87.6  85.2  85.0   Hematocrit 36.0 - 46.0 % 38.2  45.9  48.1   Platelets 150 - 400 K/uL 188  220  288        Latest Ref Rng & Units 10/05/2023    3:45 AM 10/04/2023    8:10 AM 01/13/2018    3:29 PM  BMP  Glucose 70 - 99 mg/dL 884  891  896   BUN 8 - 23 mg/dL 39  53  22   Creatinine 0.44 - 1.00 mg/dL 8.92  8.40  8.91   Sodium 135 - 145 mmol/L 138  134  144   Potassium 3.5 - 5.1 mmol/L 3.2  3.6  3.5   Chloride 98 - 111 mmol/L 106  95  111   CO2 22 - 32 mmol/L 24  24  24    Calcium  8.9 - 10.3 mg/dL 8.5  9.4  9.9      Author: Drue ONEIDA Potter, MD 10/05/2023 3:12 PM  For on call review [email or web address].

## 2023-10-05 NOTE — Consult Note (Signed)
 PHARMACY - ANTICOAGULATION CONSULT NOTE  Pharmacy Consult for Warfarin Indication: atrial fibrillation  Allergies  Allergen Reactions   Penicillins Rash    Has patient had a PCN reaction causing immediate rash, facial/tongue/throat swelling, SOB or lightheadedness with hypotension: Unknown Has patient had a PCN reaction causing severe rash involving mucus membranes or skin necrosis: Unknown Has patient had a PCN reaction that required hospitalization: Unknown Has patient had a PCN reaction occurring within the last 10 years: Unknown If all of the above answers are NO, then may proceed with Cephalosporin use.     Patient Measurements: Height: 5' 2 (157.5 cm) Weight: 61.5 kg (135 lb 9.3 oz) IBW/kg (Calculated) : 50.1 HEPARIN DW (KG): 61.5  Vital Signs: Temp: 98.2 F (36.8 C) (07/16 0508) Temp Source: Oral (07/15 2033) BP: 116/69 (07/16 0508) Pulse Rate: 68 (07/16 0508)  Labs: Recent Labs    10/04/23 0810 10/05/23 0345  HGB 14.7 12.3  HCT 45.9 38.2  PLT 220 188  LABPROT 16.6* 19.9*  INR 1.3* 1.6*  CREATININE 1.59* 1.07*  CKTOTAL 5,991*  --     Estimated Creatinine Clearance: 30.8 mL/min (A) (by C-G formula based on SCr of 1.07 mg/dL (H)).   Medical History: Past Medical History:  Diagnosis Date   Arthritis    Atrial fibrillation (HCC)    Hypertension     Pertinent Medications:  New dosing regimen implemented on 09/27/2023: Warfarin 2.5mg  Wednesdays, and 1.25mg  all other days Last dose: unknown  Assessment: 89 y.o. female with medical history significant of PAF on Coumadin , HTN, multiple always presented with worsening of right shoulder pain, ambulation difficulties. Per patient, fell on her left side on Saturday and hit her head, but no loss of consciousness and decided to stop taking warfarin because of this until she saw her PCP. She can't remember when her last dose was, but knows she didn't take any warfarin this past weekend. Pharmacy has been consulted  to re-initiate medication. See above for new dosing schedule per PCP Dr. Vande.    Goal of Therapy:  INR 2-3 Monitor platelets by anticoagulation protocol: Yes  Date:  INR: Dose:  7/15 1.3 2.5 mg 7/16 1.6 2 mg ordered   Plan:  INR remains subtherapeutic this morning, but trending up No signs/symptoms of bleeding noted, CBC WNL Will give warfarin 2 mg x 1 (~1.5x home dose) this afternoon Continue to monitor INR daily and adjust dose as appropriate CBC/H&H daily  Thank you for involving pharmacy in this patient's care.   Damien Napoleon, PharmD Clinical Pharmacist 10/05/2023 7:24 AM

## 2023-10-05 NOTE — Plan of Care (Signed)

## 2023-10-05 NOTE — Care Management Obs Status (Signed)
 MEDICARE OBSERVATION STATUS NOTIFICATION   Patient Details  Name: Cassandra Weiss MRN: [id number] Date of Birth: May 31, 1932   Medicare Observation Status Notification Given:  Yes    Kevan Prouty W, CMA 10/05/2023, 11:50 AM

## 2023-10-05 NOTE — Evaluation (Signed)
 Physical Therapy Evaluation Patient Details Name: Cassandra Weiss MRN: [id number] DOB: [birth year] Today's Date: 10/05/2023  History of Present Illness  Pt is a 89 y/o F admitted on 10/04/23 after presenting with c/o frequent falls, ambulation impairment. PMH: PAF, HTN, arthritis  Clinical Impression  Pt seen for PT evaluation with pt agreeable, reporting fatigue from sitting up in recliner. Pt is able to complete sit>stand x 2 from recliner with min assist, PT cuing pt to shift trunk anterior by using LUE on armrest, PT supporting RUE. Pt noted to be incontinent of bowel, pt unaware; PT provided total assist for peri hygiene. Pt is able to complete stand pivot recliner>bed with HHA min assist, but politely declines further standing or gait attempts 2/2 fatigue. Will continue to follow pt acutely to progress mobility as able. Recommend post acute rehab <3 hours therapy/day upon d/c.        If plan is discharge home, recommend the following: A little help with walking and/or transfers;A little help with bathing/dressing/bathroom;Assistance with cooking/housework;Assist for transportation;Help with stairs or ramp for entrance   Can travel by private vehicle   Yes    Equipment Recommendations Other (comment) (defer to next venue)  Recommendations for Other Services  Rehab consult    Functional Status Assessment Patient has had a recent decline in their functional status and demonstrates the ability to make significant improvements in function in a reasonable and predictable amount of time.     Precautions / Restrictions Precautions Precautions: Fall Precaution/Restrictions Comments: ?R rotator cuff injury? Restrictions Weight Bearing Restrictions Per Provider Order: No      Mobility  Bed Mobility Overal bed mobility: Needs Assistance Bed Mobility: Sit to Supine       Sit to supine: Min assist (assistance to lower head to bed, extra time to elevate BLE onto bed, min assist to  reposition shoulders straight in bed)        Transfers Overall transfer level: Needs assistance Equipment used: 1 person hand held assist Transfers: Sit to/from Stand, Bed to chair/wheelchair/BSC Sit to Stand: Min assist (PT providing support to R shoulder, sit<>stand x 2 from recliner)   Step pivot transfers: Min assist (HHA recliner>bed on R)            Ambulation/Gait Ambulation/Gait assistance:  (pt declines 2/2 fatigue)                Stairs            Wheelchair Mobility     Tilt Bed    Modified Rankin (Stroke Patients Only)       Balance Overall balance assessment: Needs assistance Sitting-balance support: Feet supported Sitting balance-Leahy Scale: Fair     Standing balance support: Single extremity supported, During functional activity Standing balance-Leahy Scale: Poor                               Pertinent Vitals/Pain Pain Assessment Pain Assessment: Faces Faces Pain Scale: Hurts a little bit Pain Location: R shoulder with touch, movement Pain Descriptors / Indicators: Grimacing, Guarding Pain Intervention(s): Monitored during session, Repositioned    Home Living Family/patient expects to be discharged to:: Private residence Living Arrangements: Spouse/significant other Available Help at Discharge: Family;Available 24 hours/day Type of Home: House Home Access: Stairs to enter   [employer] of Steps: 4 on the side of the house, 4-5 in the front- pt goes in on the side, rail on the R going  up   Home Layout: One level Home Equipment: Grab bars - tub/shower Additional Comments: husband is home 24/7, has someone come in to clean the house 2x, does shopping if needed for approx 2 hrs    Prior Function Prior Level of Function : History of Falls (last six months)             Mobility Comments: ambulate without AD in the home, SPC outside of the home, speaks of 2 falls in the past but unsure of when the 2nd  one was ADLs Comments: pt reports MOD I with ADL, assit with IADL from husband, has housekeeper 2x per month for approx 2 hrs     Extremity/Trunk Assessment   Upper Extremity Assessment Upper Extremity Assessment: Defer to OT evaluation;RUE deficits/detail RUE Deficits / Details: no active shoulder ROM 2/2 weakness, also endorses pain with touch & PROM, decreased grasp R hand (~3+/5)    Lower Extremity Assessment Lower Extremity Assessment: Generalized weakness       Communication        Cognition Arousal: Alert Behavior During Therapy: WFL for tasks assessed/performed   PT - Cognitive impairments: No family/caregiver present to determine baseline                       PT - Cognition Comments: Pt incontinent of BM during session, unaware, reports this is not baseline.   Following commands impaired: Follows one step commands with increased time     Cueing Cueing Techniques: Verbal cues     General Comments      Exercises     Assessment/Plan    PT Assessment Patient needs continued PT services  PT Problem List Decreased strength;Pain;Decreased range of motion;Decreased activity tolerance;Decreased knowledge of use of DME;Decreased balance;Decreased safety awareness;Decreased mobility;Decreased knowledge of precautions       PT Treatment Interventions DME instruction;Balance training;Modalities;Neuromuscular re-education;Gait training;Stair training;Functional mobility training;Therapeutic activities;Therapeutic exercise;Cognitive remediation;Patient/family education    PT Goals (Current goals can be found in the Care Plan section)  Acute Rehab PT Goals Patient Stated Goal: rest, get better PT Goal Formulation: With patient Time For Goal Achievement: 10/19/23 Potential to Achieve Goals: Good    Frequency Min 2X/week     Co-evaluation               AM-PAC PT 6 Clicks Mobility  Outcome Measure Help needed turning from your back to your side  while in a flat bed without using bedrails?: A Little Help needed moving from lying on your back to sitting on the side of a flat bed without using bedrails?: A Little Help needed moving to and from a bed to a chair (including a wheelchair)?: A Little Help needed standing up from a chair using your arms (e.g., wheelchair or bedside chair)?: A Little Help needed to walk in hospital room?: A Little Help needed climbing 3-5 steps with a railing? : A Lot 6 Click Score: 17    End of Session   Activity Tolerance: Patient limited by fatigue Patient left: in bed;with call bell/phone within reach;with bed alarm set Nurse Communication: Mobility status PT Visit Diagnosis: Muscle weakness (generalized) (M62.81);Unsteadiness on feet (R26.81);Difficulty in walking, not elsewhere classified (R26.2);Other abnormalities of gait and mobility (R26.89);Pain Pain - Right/Left: Right Pain - part of body: Shoulder    Time: 1146-1200 PT Time Calculation (min) (ACUTE ONLY): 14 min   Charges:   PT Evaluation $PT Eval Low Complexity: 1 Low   PT General Charges $$ ACUTE  PT VISIT: 1 Visit         Cassandra Weiss, PT, DPT 10/05/23, 12:11 PM   Cassandra Weiss 10/05/2023, 12:09 PM

## 2023-10-05 NOTE — Evaluation (Signed)
 Occupational Therapy Evaluation Patient Details Name: Cassandra Weiss MRN: [id number] DOB: [birth year] Today's Date: 10/05/2023   History of Present Illness   Pt is an 89 year old female admitted with frequent falls, AKI, Transaminitis    PMH significant for atrial fibrillation (although Coumadin  is at home medication list patient denies taking this), mild dementia on donezepil, coronary artery disease, hypertension, hyperlipidemia     Clinical Impressions Chart reviewed, pt greeted in bed, agreeable to OT evaluation. PTA she reports she is MOD I for ADL, has Prn assist for IADL, and amb with SPC. Pt presents with deficits in strength, endurance, activity tolerance, balance, cognition, affecting safe and optimal ADL completion. Pt is also unable to perform AROM of RUE and grimacing with attempted PROM; MD in room at end of session and notified. MIN A required fro bed mobility, STS with Min-MOD A, step pivot transfer to bedside chair with MIN-MOD A via HHA. MAX A required for UB dressing due to RUE deficits. Pt is performing ADL/functional mobility below PLOF, will benefit from acute OT to address deficits and to facilitate return to PLOF. OT will follow.      If plan is discharge home, recommend the following:   A lot of help with walking and/or transfers;Two people to help with walking and/or transfers;Help with stairs or ramp for entrance;Direct supervision/assist for medications management;Assistance with cooking/housework;Assist for transportation     Functional Status Assessment   Patient has had a recent decline in their functional status and demonstrates the ability to make significant improvements in function in a reasonable and predictable amount of time.     Equipment Recommendations   Other (comment) (defer to next venue of care)     Recommendations for Other Services         Precautions/Restrictions   Precautions Precautions: Fall Recall of  Precautions/Restrictions: Impaired Restrictions Weight Bearing Restrictions Per Provider Order: No     Mobility Bed Mobility Overal bed mobility: Needs Assistance Bed Mobility: Supine to Sit     Supine to sit: Min assist, HOB elevated, Used rails          Transfers Overall transfer level: Needs assistance Equipment used: 1 person hand held assist Transfers: Sit to/from Stand Sit to Stand: Min assist, Mod assist                  Balance Overall balance assessment: Needs assistance Sitting-balance support: Feet supported Sitting balance-Leahy Scale: Fair     Standing balance support: Single extremity supported, During functional activity Standing balance-Leahy Scale: Poor                             ADL either performed or assessed with clinical judgement   ADL Overall ADL's : Needs assistance/impaired     Grooming: Sitting;Minimal assistance           Upper Body Dressing : Maximal assistance;Sitting Upper Body Dressing Details (indicate cue type and reason): donn gown, hemi dressing technique Lower Body Dressing: Maximal assistance   Toilet Transfer: Moderate assistance Toilet Transfer Details (indicate cue type and reason): step pivot to bedside chair, simulated via HHA Toileting- Clothing Manipulation and Hygiene: Maximal assistance;Sit to/from stand Toileting - Clothing Manipulation Details (indicate cue type and reason): incontinent of BM             Vision Patient Visual Report: No change from baseline       Perception  Praxis         Pertinent Vitals/Pain Pain Assessment Pain Assessment: Faces Faces Pain Scale: Hurts a little bit Pain Location: RUE Pain Descriptors / Indicators: Grimacing, Guarding Pain Intervention(s): Monitored during session, Repositioned     Extremity/Trunk Assessment Upper Extremity Assessment Upper Extremity Assessment: RUE deficits/detail;Right hand dominant;Generalized weakness;LUE  deficits/detail RUE Deficits / Details: pt unable to perform AROM shoulder flexion, poor tolerance for PROM with grimacing and reports of pain; MD in room following evaluation, notified   Lower Extremity Assessment Lower Extremity Assessment: Generalized weakness       Communication Communication Communication: No apparent difficulties   Cognition Arousal: Alert Behavior During Therapy: WFL for tasks assessed/performed Cognition: History of cognitive impairments, No family/caregiver present to determine baseline, Cognition impaired       Memory impairment (select all impairments): Short-term memory Attention impairment (select first level of impairment): Selective attention Executive functioning impairment (select all impairments): Problem solving OT - Cognition Comments: will continue to assess                 Following commands: Impaired Following commands impaired: Follows one step commands with increased time     Cueing  General Comments   Cueing Techniques: Verbal cues  vss throughout   Exercises Other Exercises Other Exercises: edu re: role of OT, role of rehab, discharge recommendations, safe ADL completion   Shoulder Instructions      Home Living Family/patient expects to be discharged to:: Private residence Living Arrangements: Spouse/significant other Available Help at Discharge: Family;Available 24 hours/day Type of Home: House Home Access: Stairs to enter [employer] of Steps: 4 on the side of the house, 4-5 in the front- pt goes in on the side, rail on the R going up   Home Layout: One level     Bathroom Shower/Tub: Tub/shower unit;Sponge bathes at baseline   [employer]: Standard Bathroom Accessibility: No (pt is unsure)   Home Equipment: Grab bars - tub/shower   Additional Comments: husband is home 24/7, has someone come in to clean the house 2x, does shopping if needed for approx 2 hrs      Prior Functioning/Environment  Prior Level of Function : History of Falls (last six months)             Mobility Comments: ambulate without AD in the home, SPC outside of the home, speaks of 2 falls in the past but unsure of when the 2nd one was ADLs Comments: pt reports MOD I with ADL, assit with IADL from husband, has housekeeper 2x per month for approx 2 hrs    OT Problem List: Decreased activity tolerance;Decreased knowledge of use of DME or AE;Decreased strength;Impaired balance (sitting and/or standing);Decreased cognition   OT Treatment/Interventions: Self-care/ADL training;Therapeutic exercise;Patient/family education;Balance training;DME and/or AE instruction;Therapeutic activities;Energy conservation      OT Goals(Current goals can be found in the care plan section)   Acute Rehab OT Goals Patient Stated Goal: improve strength OT Goal Formulation: With patient Time For Goal Achievement: 10/19/23 Potential to Achieve Goals: Good ADL Goals Pt Will Perform Grooming: with supervision;sitting;standing Pt Will Perform Lower Body Dressing: with supervision;sitting/lateral leans;sit to/from stand Pt Will Transfer to Toilet: with supervision;ambulating Pt Will Perform Toileting - Clothing Manipulation and hygiene: with supervision;sitting/lateral leans;sit to/from stand   OT Frequency:  Min 2X/week    Co-evaluation              AM-PAC OT 6 Clicks Daily Activity     Outcome Measure Help from another  person eating meals?: None Help from another person taking care of personal grooming?: None Help from another person toileting, which includes using toliet, bedpan, or urinal?: A Lot Help from another person bathing (including washing, rinsing, drying)?: A Lot Help from another person to put on and taking off regular upper body clothing?: A Little Help from another person to put on and taking off regular lower body clothing?: A Lot 6 Click Score: 17   End of Session Equipment Utilized During  Treatment: Gait belt Nurse Communication: Mobility status  Activity Tolerance: Patient tolerated treatment well Patient left: in chair;with call bell/phone within reach;with chair alarm set;with nursing/sitter in room (MD in room)  OT Visit Diagnosis: Other abnormalities of gait and mobility (R26.89);Muscle weakness (generalized) (M62.81)                Time: 8978-8945 OT Time Calculation (min): 33 min Charges:  OT General Charges $OT Visit: 1 Visit OT Evaluation $OT Eval Moderate Complexity: 1 Mod  [name] Sheffield, OTD OTR/L  10/05/23, 1:03 PM

## 2023-10-06 ENCOUNTER — Encounter: Payer: Self-pay | Admitting: Internal Medicine

## 2023-10-06 DIAGNOSIS — G9341 Metabolic encephalopathy: Secondary | ICD-10-CM | POA: Diagnosis not present

## 2023-10-06 LAB — CBC WITH DIFFERENTIAL/PLATELET
Abs Immature Granulocytes: 0.05 K/uL (ref 0.00–0.07)
Basophils Absolute: 0 K/uL (ref 0.0–0.1)
Basophils Relative: 1 %
Eosinophils Absolute: 0.1 K/uL (ref 0.0–0.5)
Eosinophils Relative: 1 %
HCT: 38.8 % (ref 36.0–46.0)
Hemoglobin: 12.4 g/dL (ref 12.0–15.0)
Immature Granulocytes: 1 %
Lymphocytes Relative: 18 %
Lymphs Abs: 1.6 K/uL (ref 0.7–4.0)
MCH: 27.3 pg (ref 26.0–34.0)
MCHC: 32 g/dL (ref 30.0–36.0)
MCV: 85.3 fL (ref 80.0–100.0)
Monocytes Absolute: 1.1 K/uL — ABNORMAL HIGH (ref 0.1–1.0)
Monocytes Relative: 13 %
Neutro Abs: 5.9 K/uL (ref 1.7–7.7)
Neutrophils Relative %: 66 %
Platelets: 209 K/uL (ref 150–400)
RBC: 4.55 MIL/uL (ref 3.87–5.11)
RDW: 17.7 % — ABNORMAL HIGH (ref 11.5–15.5)
WBC: 8.7 K/uL (ref 4.0–10.5)
nRBC: 0 % (ref 0.0–0.2)

## 2023-10-06 LAB — BASIC METABOLIC PANEL WITH GFR
Anion gap: 7 (ref 5–15)
BUN: 47 mg/dL — ABNORMAL HIGH (ref 8–23)
CO2: 25 mmol/L (ref 22–32)
Calcium: 9 mg/dL (ref 8.9–10.3)
Chloride: 107 mmol/L (ref 98–111)
Creatinine, Ser: 1.12 mg/dL — ABNORMAL HIGH (ref 0.44–1.00)
GFR, Estimated: 47 mL/min — ABNORMAL LOW (ref >60)
Glucose, Bld: 91 mg/dL (ref 70–99)
Potassium: 4 mmol/L (ref 3.5–5.1)
Sodium: 139 mmol/L (ref 135–145)

## 2023-10-06 LAB — PROTIME-INR
INR: 1.7 — ABNORMAL HIGH (ref 0.8–1.2)
Prothrombin Time: 20.4 s — ABNORMAL HIGH (ref 11.4–15.2)

## 2023-10-06 MED ORDER — CHLORHEXIDINE GLUCONATE CLOTH 2 % EX PADS
6.0000 | MEDICATED_PAD | Freq: Every day | CUTANEOUS | Status: DC
  Administered 2023-10-06: 6 via TOPICAL

## 2023-10-06 MED ORDER — ENOXAPARIN SODIUM 60 MG/0.6ML IJ SOSY
60.0000 mg | PREFILLED_SYRINGE | Freq: Two times a day (BID) | INTRAMUSCULAR | Status: DC
  Administered 2023-10-06 – 2023-10-07 (×3): 60 mg via SUBCUTANEOUS
  Filled 2023-10-06 (×3): qty 0.6

## 2023-10-06 MED ORDER — WARFARIN SODIUM 2 MG PO TABS
2.0000 mg | ORAL_TABLET | Freq: Once | ORAL | Status: AC
  Administered 2023-10-06: 2 mg via ORAL
  Filled 2023-10-06: qty 1

## 2023-10-06 NOTE — TOC Progression Note (Signed)
 Transition of Care [facility]) - Progression Note    Patient Details  Name: Cassandra Weiss MRN: [id number] Date of Birth: May 09, 1932  Transition of Care [facility]) CM/SW Contact  Alvaro Louder, [place] Phone Number: 10/06/2023, 4:08 PM  Clinical Narrative:     [facility] faxed out FL2 out to SNF's in [place]. [place] top choices are [facility], [facility], and [facility]. Awaiting responses from SNF's.   TOC to follow for discharge.          Expected Discharge Plan and Services                                               Social Determinants of Health (SDOH) Interventions SDOH Screenings   Food Insecurity: No Food Insecurity (10/04/2023)  Housing: Low Risk  (10/04/2023)  Transportation Needs: No Transportation Needs (10/04/2023)  Utilities: Not At Risk (10/04/2023)  Financial Resource Strain: Low Risk  (08/17/2023)   Received from [facility]  Physical Activity: Insufficiently Active (01/13/2018)  Social Connections: Moderately Integrated (10/04/2023)  Stress: No Stress Concern Present (01/13/2018)  Tobacco Use: Low Risk  (10/06/2023)    Readmission Risk Interventions     No data to display

## 2023-10-06 NOTE — Consult Note (Addendum)
 PHARMACY - ANTICOAGULATION CONSULT NOTE  Pharmacy Consult for Warfarin Indication: atrial fibrillation  Allergies  Allergen Reactions   Penicillins Rash    Has patient had a PCN reaction causing immediate rash, facial/tongue/throat swelling, SOB or lightheadedness with hypotension: Unknown Has patient had a PCN reaction causing severe rash involving mucus membranes or skin necrosis: Unknown Has patient had a PCN reaction that required hospitalization: Unknown Has patient had a PCN reaction occurring within the last 10 years: Unknown If all of the above answers are NO, then may proceed with Cephalosporin use.     Patient Measurements: Height: 5' 2 (157.5 cm) Weight: 61.5 kg (135 lb 9.3 oz) IBW/kg (Calculated) : 50.1 HEPARIN DW (KG): 61.5  Vital Signs: Temp: 97.5 F (36.4 C) (07/17 0534) BP: 112/58 (07/17 0534) Pulse Rate: 67 (07/17 0534)  Labs: Recent Labs    10/04/23 0810 10/05/23 0345 10/06/23 0356  HGB 14.7 12.3 12.4  HCT 45.9 38.2 38.8  PLT 220 188 209  LABPROT 16.6* 19.9* 20.4*  INR 1.3* 1.6* 1.7*  CREATININE 1.59* 1.07* 1.12*  CKTOTAL 5,991*  --   --     Estimated Creatinine Clearance: 29.4 mL/min (A) (by C-G formula based on SCr of 1.12 mg/dL (H)).   Medical History: Past Medical History:  Diagnosis Date   Arthritis    Atrial fibrillation (HCC)    Hypertension     Pertinent Medications:  New dosing regimen implemented on 09/27/2023: Warfarin 2.5mg  Wednesdays, and 1.25mg  all other days Last dose: unknown  Assessment: 89 y.o. female with medical history significant of PAF on Coumadin , HTN, multiple always presented with worsening of right shoulder pain, ambulation difficulties. Per patient, fell on her left side on Saturday and hit her head, but no loss of consciousness and decided to stop taking warfarin because of this until she saw her PCP. She can't remember when her last dose was, but knows she didn't take any warfarin this past weekend. Pharmacy  has been consulted to re-initiate medication. See above for new dosing schedule per PCP Dr. Vande.    Goal of Therapy:  INR 2-3 Monitor platelets by anticoagulation protocol: Yes  Date:  INR: Dose:  7/15 1.3 2.5 mg 7/16 1.6 2 mg 7/17 1.7 2 mg ordered    Plan:  INR remains subtherapeutic this morning, but trending up No signs/symptoms of bleeding noted, CBC stable and WNL Give warfarin 2 mg x1 (~1.5x home dose) today Per MD, Will start bridge with lovenox  60 mg SQ BID (1 mg/kg BID) until INR is therapeutic given CHADSVASC of at least 5 (age +42, female +1, HTN +1, CAD +1) Continue to monitor INR daily and adjust dose as appropriate CBC/H&H daily  Thank you for involving pharmacy in this patient's care.   Damien Napoleon, PharmD Clinical Pharmacist 10/06/2023 7:14 AM

## 2023-10-06 NOTE — TOC Initial Note (Signed)
 Transition of Care [facility]) - Initial/Assessment Note    Patient Details  Name: Cassandra Weiss MRN: [id number] Date of Birth: 1932-08-17  Transition [facility]) CM/SW Contact:    Dalia GORMAN Fuse, RN Phone Number: 10/06/2023, 4:12 PM  Clinical Narrative:                 [facility] visited the patient in her room the morning. The patient was lethargic and unable to answer questions  1500 TOC visited the patient in the room, her husband, son, and daughter were present. Patient is from home with her spouse. Up until the recent hospitalization she was independent of ADLs, but relied on her family to drive her places. She has a PCP, Dr Cy. Her husband reports that she has DME: walker, cane, WC, and [place].  Therapy recommends SNF, the families preference is [facility], [place], and [facility] in that order. FL2 completed and sent out.  Expected Discharge Plan: Skilled Nursing Facility Barriers to Discharge: Continued Medical Work up   Patient Goals and CMS Choice     Choice offered to / list presented to : Patient      Expected Discharge Plan and Services   Discharge Planning Services: CM Consult   Living arrangements for the past 2 months: Single Family Home                                      Prior Living Arrangements/Services Living arrangements for the past 2 months: Single Family Home Lives with:: Spouse              Current home services: DME [place], [place], [place])    Activities of Daily Living   ADL Screening (condition at time of admission) Independently performs ADLs?: Yes (appropriate for developmental age) Is the patient deaf or have difficulty hearing?: No Does the patient have difficulty seeing, even when wearing glasses/contacts?: No Does the patient have difficulty concentrating, remembering, or making decisions?: No  Permission Sought/Granted                  Emotional Assessment Appearance:: Appears stated age Attitude/Demeanor/Rapport: Lethargic Affect  (typically observed): Calm Orientation: : Oriented to Self   Psych Involvement: No (comment)  Admission diagnosis:  Shortness of breath [R06.02] General weakness [R53.1] Fall from standing, initial encounter [W19.XXXA] Longstanding persistent atrial fibrillation (HCC) [I48.11] Medication non-compliance due to excessive pill burden [Z91.148] AMS (altered mental status) [R41.82] Patient Active Problem List   Diagnosis Date Noted   AKI (acute kidney injury) (HCC) 10/04/2023   AMS (altered mental status) 10/04/2023   Altered mental status 01/13/2018   PCP:  Sadie Manna, MD Pharmacy:   [facility] - [place], [place] - [address] [place] [address] Phone: [phone] Fax: [phone]     Social Drivers of Health (SDOH) Social History: SDOH Screenings   Food Insecurity: No Food Insecurity (10/04/2023)  Housing: Low Risk  (10/04/2023)  Transportation Needs: No Transportation Needs (10/04/2023)  Utilities: Not At Risk (10/04/2023)  Financial Resource Strain: Low Risk  (08/17/2023)   Received from [facility]  Physical Activity: Insufficiently Active (01/13/2018)  Social Connections: Moderately Integrated (10/04/2023)  Stress: No Stress Concern Present (01/13/2018)  Tobacco Use: Low Risk  (10/06/2023)   SDOH Interventions:     Readmission Risk Interventions     No data to display

## 2023-10-06 NOTE — Progress Notes (Signed)
 I was called to attempt a foley insertion for retention on this patient after multiple failed attempts by 4 other RN's. I attempted twice without success. Notified RN Lamar to contact urology service.

## 2023-10-06 NOTE — Progress Notes (Signed)
 Progress Note   Patient: Cassandra Weiss FMW:[id number] DOB: 07/19/1932 DOA: 10/04/2023     0 DOS: the patient was seen and examined on 10/06/2023    Brief hospital course: Cassandra Weiss is a 89 y.o. female with medical history significant of PAF on Coumadin , HTN, multiple always presented with worsening of right shoulder pain, ambulation difficulties.   Patient has had chronic ambulation dysfunction, uses a cane to ambulate.  Her ambulation status has deteriorated and recently husband has purchased a old record for her to use.  Patient however refused to use wheeled walker and just limping on with the cane.  Saturday she fell on the left side hit her head no LOC denied any prodromes of lightheadedness chest pain palpitations.  Since then she has been seen in the recliner the whole week and refused to ambulate.  Patient complaining about severe right shoulder pain,  unable to use cane anymore because of  severe right shoulder pain when moving right arm.  Denies any urinary symptoms no cough no diarrhea.   ED Course: Afebrile, borderline tachycardia blood pressure 170/90 O2 saturation 100% on room air.  Blood work showed WBC 13 hemoglobin 14.7 BUN 53 creatinine 1.5 compared to baseline 0.8-3.9 bicarb 24K 3.6 AST 150 ALT 48.  CTA abdomen pelvis with contrast showed no acute findings, CT head negative for acute finding x-ray showed severe right shoulder OA.     Assessment and Plan:    Acute on chronic ambulation impairment Frequent falls - No focal neurological deficit - Part of the problem is from severe multiple especially severe right shoulder OA.   Continue current pain management regimen Continue PT OT PT has recommended skilled nursing facility placement   Acute renal failure secondary to poor oral intake CT abdomen pelvis negative for-obstructive uropathy Has completed IV fluid Monitor renal function closely   Transaminitis -No RUQ symptoms, CT abdomen showed no acute  findings- Continue to hold statin therapy Monitor labs closely   HTN, uncontrolled - Resume home BP meds including amlodipine  and Coreg  - Hold off HCTZ and lisinopril given there is a kidney function surge   PAF - Rate controlled Continue Coreg  Pharmacy on board and we will try to bridge with Lovenox  whilst inpatient  Severe osteoarthritis of the right shoulder MRI of the shoulder reveals severe osteoarthritis with large effusion I discussed the findings with orthopedic surgeon Dr.  Arthea Sheer who recommends patient to follow-up as an outpatient to have ultrasound-guided steroid injection.   DVT prophylaxis: Coumadin    Code Status: Full code   Family Communication: Husband at bedside   Disposition Plan: Expect less than 2 midnight hospital stay   Consults called: None   Admission status: Telemetry   Subjective:  Patient seen and examined at bedside this morning Denies nausea vomiting abdominal pain chest pain cough     Physical Exam:   Eyes: PERRL, lids and conjunctivae normal ENMT: Mucous membranes are moist. Posterior pharynx clear of any exudate or lesions.Normal dentition.  Neck: normal, supple, no masses, no thyromegaly Respiratory: clear to auscultation bilaterally, no wheezing, no crackles. Normal respiratory effort. No accessory muscle use.  Cardiovascular: Regular rate and rhythm, no murmurs / rubs / gallops. No extremity edema. 2+ pedal pulses. No carotid bruits.  Abdomen: no tenderness, no masses palpated. No hepatosplenomegaly. Bowel sounds positive.  Musculoskeletal: Severe right shoulder tenderness with decreased ROM Skin: no rashes, lesions, ulcers. No induration Neurologic: CN 2-12 grossly intact. Sensation intact, DTR normal.  Strength 4/5 on bilateral  lower extremities below the hips Psychiatric: Normal judgment and insight. Alert and oriented x 3. Normal mood.    Data reviewed: X-ray of the show that showed severe osteoarthritis Plan of care  discussed with TOC as well as PT OT with recommendation for skilled nursing facility placement Vitals:   10/05/23 1554 10/05/23 1955 10/06/23 0534 10/06/23 0720  BP: 119/63 (!) 110/43 (!) 112/58 (!) 133/58  Pulse: 64 60 67 63  Resp: 17 17 16 16   Temp: 99.3 F (37.4 C) 97.8 F (36.6 C) (!) 97.5 F (36.4 C) 97.8 F (36.6 C)  TempSrc:      SpO2: 99% (!) 77% 99% 98%  Weight:      Height:          Latest Ref Rng & Units 10/06/2023    3:56 AM 10/05/2023    3:45 AM 10/04/2023    8:10 AM  CBC  WBC 4.0 - 10.5 K/uL 8.7  10.5  13.2   Hemoglobin 12.0 - 15.0 g/dL 87.5  87.6  85.2   Hematocrit 36.0 - 46.0 % 38.8  38.2  45.9   Platelets 150 - 400 K/uL 209  188  220        Latest Ref Rng & Units 10/06/2023    3:56 AM 10/05/2023    3:45 AM 10/04/2023    8:10 AM  BMP  Glucose 70 - 99 mg/dL 91  884  891   BUN 8 - 23 mg/dL 47  39  53   Creatinine 0.44 - 1.00 mg/dL 8.87  8.92  8.40   Sodium 135 - 145 mmol/L 139  138  134   Potassium 3.5 - 5.1 mmol/L 4.0  3.2  3.6   Chloride 98 - 111 mmol/L 107  106  95   CO2 22 - 32 mmol/L 25  24  24    Calcium  8.9 - 10.3 mg/dL 9.0  8.5  9.4      Author: Drue ONEIDA Potter, MD 10/06/2023 3:16 PM  For on call review [email or web address].

## 2023-10-06 NOTE — Progress Notes (Signed)
 MEWS Progress Note  Patient Details Name: Cassandra Weiss MRN: [id number] DOB: 1933/01/19 Today's Date: 10/06/2023   MEWS Flowsheet Documentation:  Assess: MEWS Score Temp: 97.8 F (36.6 C) BP: (!) 115/56 MAP (mmHg): 73 Pulse Rate: 68 ECG Heart Rate: 96 Resp: 18 Level of Consciousness: Alert SpO2: 99 % O2 Device: Room Air Assess: MEWS Score MEWS Temp: 0 MEWS Systolic: 0 MEWS Pulse: 0 MEWS RR: 0 MEWS LOC: 0 MEWS Score: 0 MEWS Score Color: Green Assess: SIRS CRITERIA SIRS Temperature : 0 SIRS Respirations : 0 SIRS Pulse: 0 SIRS WBC: 0 SIRS Score Sum : 0 Assess: if the MEWS score is Yellow or Red Were vital signs accurate and taken at a resting state?: No, vital signs rechecked Does the patient meet 2 or more of the SIRS criteria?: No Notify: Charge Nurse/RN Name of Charge Nurse/RN Notified: [name], RN  Pt was having process of foley insertion explained to her while getting her vitals.  Once she had her concerns addressed, she calmed down and her HR returned to normal.    Lamar Satterfield 10/06/2023, 8:08 PM

## 2023-10-06 NOTE — Plan of Care (Signed)

## 2023-10-06 NOTE — Significant Event (Signed)
       CROSS COVER NOTE  NAME: Cassandra Weiss MRN: [id number] DOB : Apr 08, 1932 ATTENDING PHYSICIAN: Dorinda Drue DASEN, MD    Date of Service   10/06/2023   HPI/Events of Note   Notified by RN bladder scan over 800 ml. Catheter insertion attempted by 5 Rns Patient    Interventions   Assessment/Plan: X X X

## 2023-10-06 NOTE — NC FL2 (Signed)
 [place]  MEDICAID FL2 LEVEL OF CARE FORM     IDENTIFICATION  Patient Name: Cassandra Weiss Birthdate: December 26, 1932 Sex: female Admission Date (Current Location): 10/04/2023  [facility] and [place] Number:  Chiropodist and Address:  [facility], [address], [place], [place] [address]      Provider Number: [id number]  Attending Physician Name and Address:  Dorinda Drue DASEN, MD  Relative Name and Phone Number:  Naviah Belfield, [phone]    Current Level of Care: Hospital Recommended Level of Care: Skilled Nursing Facility Prior Approval Number:    Date Approved/Denied:   PASRR Number: [id number]  Discharge Plan: SNF    Current Diagnoses: Patient Active Problem List   Diagnosis Date Noted   AKI (acute kidney injury) (HCC) 10/04/2023   AMS (altered mental status) 10/04/2023   Altered mental status 01/13/2018    Orientation RESPIRATION BLADDER Height & Weight     Self, Time, Situation, Place  Normal Incontinent Weight: 135 lb 9.3 oz (61.5 kg) Height:  5' 2 (157.5 cm)  BEHAVIORAL SYMPTOMS/MOOD NEUROLOGICAL BOWEL NUTRITION STATUS      Incontinent Diet (Heart)  AMBULATORY STATUS COMMUNICATION OF NEEDS Skin   Limited Assist Verbally Normal                       Personal Care Assistance Level of Assistance  Bathing, Feeding, Dressing Bathing Assistance: Limited assistance Feeding assistance: Independent Dressing Assistance: Limited assistance     Functional Limitations Info  Hearing   Hearing Info: Impaired      SPECIAL CARE FACTORS FREQUENCY  PT (By licensed PT), OT (By licensed OT)     PT Frequency: 5x/week OT Frequency: 5x/week            Contractures      Additional Factors Info  Code Status, Allergies Code Status Info: Full Allergies Info: Penicillins           Current Medications (10/06/2023):  This is the current hospital active medication list Current Facility-Administered Medications  Medication  Dose Route Frequency Provider Last Rate Last Admin   acetaminophen  (TYLENOL ) tablet 650 mg  650 mg Oral Q6H PRN Laurita Manor T, MD       Or   acetaminophen  (TYLENOL ) suppository 650 mg  650 mg Rectal Q6H PRN Laurita Manor T, MD       ALPRAZolam  (XANAX ) tablet 0.5 mg  0.5 mg Oral QHS PRN Laurita Manor T, MD       amLODipine  (NORVASC ) tablet 10 mg  10 mg Oral Daily Zhang, Ping T, MD   10 mg at 10/06/23 0908   carvedilol  (COREG ) tablet 25 mg  25 mg Oral BID Laurita Manor T, MD   25 mg at 10/06/23 0908   enoxaparin  (LOVENOX ) injection 60 mg  60 mg Subcutaneous Q12H Dorinda Drue T, MD   60 mg at 10/06/23 1323   feeding supplement (ENSURE PLUS HIGH PROTEIN) liquid 237 mL  237 mL Oral BID BM Dorinda Drue T, MD   237 mL at 10/06/23 0908   gabapentin  (NEURONTIN ) capsule 300 mg  300 mg Oral TID Laurita Manor T, MD   300 mg at 10/06/23 0908   lidocaine  (LIDODERM ) 5 % 1 patch  1 patch Transdermal Q24H Laurita Manor T, MD   1 patch at 10/06/23 1324   ondansetron  (ZOFRAN ) tablet 4 mg  4 mg Oral Q6H PRN Laurita Manor DASEN, MD       Or   ondansetron  (ZOFRAN ) injection  4 mg  4 mg Intravenous Q6H PRN Laurita Manor T, MD       warfarin (COUMADIN ) tablet 2 mg  2 mg Oral ONCE-1600 Elesa Perkins, [facility]       Warfarin - Pharmacist Dosing Inpatient   Does not apply q1600 Nazari, Walid A, [facility]   Given at 10/05/23 8397     Discharge Medications: Please see discharge summary for a list of discharge medications.  Relevant Imaging Results:  Relevant Lab Results:   Additional Information SSN: [id number]  Keirston Saephanh  [place], LCSW

## 2023-10-07 ENCOUNTER — Encounter: Payer: Self-pay | Admitting: Internal Medicine

## 2023-10-07 DIAGNOSIS — N183 Chronic kidney disease, stage 3 unspecified: Secondary | ICD-10-CM | POA: Diagnosis not present

## 2023-10-07 DIAGNOSIS — I959 Hypotension, unspecified: Secondary | ICD-10-CM | POA: Diagnosis not present

## 2023-10-07 DIAGNOSIS — G9341 Metabolic encephalopathy: Secondary | ICD-10-CM | POA: Diagnosis not present

## 2023-10-07 DIAGNOSIS — M19012 Primary osteoarthritis, left shoulder: Secondary | ICD-10-CM | POA: Diagnosis not present

## 2023-10-07 DIAGNOSIS — R2689 Other abnormalities of gait and mobility: Secondary | ICD-10-CM | POA: Diagnosis not present

## 2023-10-07 DIAGNOSIS — F29 Unspecified psychosis not due to a substance or known physiological condition: Secondary | ICD-10-CM | POA: Diagnosis not present

## 2023-10-07 DIAGNOSIS — R748 Abnormal levels of other serum enzymes: Secondary | ICD-10-CM | POA: Diagnosis not present

## 2023-10-07 DIAGNOSIS — R4182 Altered mental status, unspecified: Secondary | ICD-10-CM | POA: Diagnosis not present

## 2023-10-07 DIAGNOSIS — R269 Unspecified abnormalities of gait and mobility: Secondary | ICD-10-CM | POA: Diagnosis not present

## 2023-10-07 DIAGNOSIS — G629 Polyneuropathy, unspecified: Secondary | ICD-10-CM | POA: Diagnosis not present

## 2023-10-07 DIAGNOSIS — I1310 Hypertensive heart and chronic kidney disease without heart failure, with stage 1 through stage 4 chronic kidney disease, or unspecified chronic kidney disease: Secondary | ICD-10-CM | POA: Diagnosis not present

## 2023-10-07 DIAGNOSIS — Z79899 Other long term (current) drug therapy: Secondary | ICD-10-CM | POA: Diagnosis not present

## 2023-10-07 DIAGNOSIS — I10 Essential (primary) hypertension: Secondary | ICD-10-CM | POA: Diagnosis not present

## 2023-10-07 DIAGNOSIS — M7551 Bursitis of right shoulder: Secondary | ICD-10-CM | POA: Diagnosis not present

## 2023-10-07 DIAGNOSIS — F419 Anxiety disorder, unspecified: Secondary | ICD-10-CM | POA: Diagnosis not present

## 2023-10-07 DIAGNOSIS — N179 Acute kidney failure, unspecified: Secondary | ICD-10-CM | POA: Diagnosis not present

## 2023-10-07 DIAGNOSIS — R0602 Shortness of breath: Secondary | ICD-10-CM | POA: Diagnosis not present

## 2023-10-07 DIAGNOSIS — E7800 Pure hypercholesterolemia, unspecified: Secondary | ICD-10-CM | POA: Diagnosis not present

## 2023-10-07 DIAGNOSIS — E860 Dehydration: Secondary | ICD-10-CM | POA: Diagnosis not present

## 2023-10-07 DIAGNOSIS — R410 Disorientation, unspecified: Secondary | ICD-10-CM | POA: Diagnosis not present

## 2023-10-07 DIAGNOSIS — R7401 Elevation of levels of liver transaminase levels: Secondary | ICD-10-CM | POA: Diagnosis not present

## 2023-10-07 DIAGNOSIS — Z9181 History of falling: Secondary | ICD-10-CM | POA: Diagnosis not present

## 2023-10-07 DIAGNOSIS — E785 Hyperlipidemia, unspecified: Secondary | ICD-10-CM | POA: Diagnosis not present

## 2023-10-07 DIAGNOSIS — G5793 Unspecified mononeuropathy of bilateral lower limbs: Secondary | ICD-10-CM | POA: Diagnosis not present

## 2023-10-07 DIAGNOSIS — Z743 Need for continuous supervision: Secondary | ICD-10-CM | POA: Diagnosis not present

## 2023-10-07 DIAGNOSIS — Z7901 Long term (current) use of anticoagulants: Secondary | ICD-10-CM | POA: Diagnosis not present

## 2023-10-07 DIAGNOSIS — M19011 Primary osteoarthritis, right shoulder: Secondary | ICD-10-CM | POA: Diagnosis not present

## 2023-10-07 DIAGNOSIS — I480 Paroxysmal atrial fibrillation: Secondary | ICD-10-CM | POA: Diagnosis not present

## 2023-10-07 DIAGNOSIS — I4891 Unspecified atrial fibrillation: Secondary | ICD-10-CM | POA: Diagnosis not present

## 2023-10-07 DIAGNOSIS — R404 Transient alteration of awareness: Secondary | ICD-10-CM | POA: Diagnosis not present

## 2023-10-07 DIAGNOSIS — G63 Polyneuropathy in diseases classified elsewhere: Secondary | ICD-10-CM | POA: Diagnosis not present

## 2023-10-07 DIAGNOSIS — R296 Repeated falls: Secondary | ICD-10-CM | POA: Diagnosis not present

## 2023-10-07 DIAGNOSIS — R262 Difficulty in walking, not elsewhere classified: Secondary | ICD-10-CM | POA: Diagnosis not present

## 2023-10-07 DIAGNOSIS — W010XXD Fall on same level from slipping, tripping and stumbling without subsequent striking against object, subsequent encounter: Secondary | ICD-10-CM | POA: Diagnosis not present

## 2023-10-07 LAB — CBC WITH DIFFERENTIAL/PLATELET
Abs Immature Granulocytes: 0.04 K/uL (ref 0.00–0.07)
Basophils Absolute: 0 K/uL (ref 0.0–0.1)
Basophils Relative: 1 %
Eosinophils Absolute: 0.1 K/uL (ref 0.0–0.5)
Eosinophils Relative: 2 %
HCT: 38.5 % (ref 36.0–46.0)
Hemoglobin: 12.3 g/dL (ref 12.0–15.0)
Immature Granulocytes: 1 %
Lymphocytes Relative: 15 %
Lymphs Abs: 1.3 K/uL (ref 0.7–4.0)
MCH: 27.1 pg (ref 26.0–34.0)
MCHC: 31.9 g/dL (ref 30.0–36.0)
MCV: 84.8 fL (ref 80.0–100.0)
Monocytes Absolute: 0.9 K/uL (ref 0.1–1.0)
Monocytes Relative: 11 %
Neutro Abs: 6 K/uL (ref 1.7–7.7)
Neutrophils Relative %: 70 %
Platelets: 251 K/uL (ref 150–400)
RBC: 4.54 MIL/uL (ref 3.87–5.11)
RDW: 17.6 % — ABNORMAL HIGH (ref 11.5–15.5)
WBC: 8.4 K/uL (ref 4.0–10.5)
nRBC: 0 % (ref 0.0–0.2)

## 2023-10-07 LAB — BASIC METABOLIC PANEL WITH GFR
Anion gap: 7 (ref 5–15)
BUN: 55 mg/dL — ABNORMAL HIGH (ref 8–23)
CO2: 23 mmol/L (ref 22–32)
Calcium: 9.1 mg/dL (ref 8.9–10.3)
Chloride: 108 mmol/L (ref 98–111)
Creatinine, Ser: 1.09 mg/dL — ABNORMAL HIGH (ref 0.44–1.00)
GFR, Estimated: 49 mL/min — ABNORMAL LOW (ref >60)
Glucose, Bld: 111 mg/dL — ABNORMAL HIGH (ref 70–99)
Potassium: 4.1 mmol/L (ref 3.5–5.1)
Sodium: 138 mmol/L (ref 135–145)

## 2023-10-07 LAB — PROTIME-INR
INR: 2 — ABNORMAL HIGH (ref 0.8–1.2)
Prothrombin Time: 23.4 s — ABNORMAL HIGH (ref 11.4–15.2)

## 2023-10-07 MED ORDER — WARFARIN 1.25 MG HALF TABLET
1.2500 mg | ORAL_TABLET | Freq: Once | ORAL | Status: DC
  Filled 2023-10-07: qty 1

## 2023-10-07 NOTE — TOC Progression Note (Addendum)
 Transition of Care [facility]) - Progression Note    Patient Details  Name: Cassandra Weiss MRN: [id number] Date of Birth: 1932/09/03  [facility] CM/SW Contact  Dalia GORMAN Fuse, RN Phone Number: 10/07/2023, 8:47 AM  Clinical Narrative:     The families 1st choice, [facility] offered a bed. TOC sent message to [name] at [employer] and updated the hub to notify of acceptance. TOC requested that [place] start insurance auth.  Approved [id number] Dates: 7/18-7/22/2025 Next Review Date: 10/11/2023   [facility] will continue to follow  Expected Discharge Plan: Skilled Nursing Facility Barriers to Discharge: Continued Medical Work up  Expected Discharge Plan and Services   Discharge Planning Services: CM Consult   Living arrangements for the past 2 months: Single Family Home                                       Social Determinants of Health (SDOH) Interventions SDOH Screenings   Food Insecurity: No Food Insecurity (10/04/2023)  Housing: Low Risk  (10/04/2023)  Transportation Needs: No Transportation Needs (10/04/2023)  Utilities: Not At Risk (10/04/2023)  Financial Resource Strain: Low Risk  (08/17/2023)   Received from [facility]  Physical Activity: Insufficiently Active (01/13/2018)  Social Connections: Moderately Integrated (10/04/2023)  Stress: No Stress Concern Present (01/13/2018)  Tobacco Use: Low Risk  (10/06/2023)    Readmission Risk Interventions     No data to display

## 2023-10-07 NOTE — Discharge Summary (Signed)
 Physician Discharge Summary   Patient: Cassandra Weiss MRN: [id number] DOB: Feb 11, 1933  Admit date:     10/04/2023  Discharge date: 10/07/23  Discharge Physician: Drue ONEIDA Potter   PCP: Sadie Manna, MD   Recommendations at discharge:  Follow-up with orthopedics and primary care physician  Discharge Diagnoses:  Acute on chronic ambulation impairment Frequent falls Acute renal failure secondary to poor oral intake Transaminitis HTN, uncontrolled PAF Severe osteoarthritis of the right shoulder  Hospital Course:  Cassandra Weiss is a 89 y.o. female with medical history significant of PAF on Coumadin , HTN, multiple always presented with worsening of right shoulder pain, ambulation difficulties.   Patient has had chronic ambulation dysfunction, uses a cane to ambulate.  Patient however refused to use wheeled walker and just limping on with the cane.  Patient also fell on the left side hit her head no LOC denied any prodromes of lightheadedness chest pain palpitations.  Since then she has been seen in the recliner the whole week and refused to ambulate.  Imaging of the right shoulder showed severe osteoarthritis.  Orthopedic surgeon was consulted who recommended outpatient follow-up with possible steroid injection.  Patient was also found to have acute renal failure secondary to poor oral intake that improved with IV fluid resuscitation.  She underwent therapy with PT OT who have recommended discharge to skilled nursing facility.    Consultants: Orthopedics Procedures performed: none  Disposition: Skilled nursing facility Diet recommendation:  Cardiac diet DISCHARGE MEDICATION: Allergies as of 10/07/2023       Reactions   Penicillins Rash   Has patient had a PCN reaction causing immediate rash, facial/tongue/throat swelling, SOB or lightheadedness with hypotension: Unknown Has patient had a PCN reaction causing severe rash involving mucus membranes or skin necrosis: Unknown Has patient  had a PCN reaction that required hospitalization: Unknown Has patient had a PCN reaction occurring within the last 10 years: Unknown If all of the above answers are NO, then may proceed with Cephalosporin use.        Medication List     STOP taking these medications    cefdinir  300 MG capsule Commonly known as: OMNICEF    doxycycline 100 MG tablet Commonly known as: VIBRA-TABS   furosemide 20 MG tablet Commonly known as: LASIX   hydrochlorothiazide 25 MG tablet Commonly known as: HYDRODIURIL   losartan  100 MG tablet Commonly known as: COZAAR    simvastatin 20 MG tablet Commonly known as: ZOCOR       TAKE these medications    acetaminophen  325 MG tablet Commonly known as: TYLENOL  Take 2 tablets (650 mg total) by mouth every 6 (six) hours as needed for mild pain (pain score 1-3) or fever (or Fever >/= 101).   ALPRAZolam  0.5 MG tablet Commonly known as: XANAX  Take 0.5 mg by mouth at bedtime as needed for sleep.   amLODipine  10 MG tablet Commonly known as: NORVASC  Take 10 mg by mouth daily.   atorvastatin  80 MG tablet Commonly known as: LIPITOR Take 80 mg by mouth daily.   carvedilol  25 MG tablet Commonly known as: COREG  Take 25 mg by mouth 2 (two) times daily.   gabapentin  300 MG capsule Commonly known as: NEURONTIN  Take by mouth.  1 (one) Capsule Capsule three times a day   hydrALAZINE  25 MG tablet Commonly known as: APRESOLINE  Take 2 tablets (50 mg total) by mouth 3 (three) times daily.   Multi-Vitamin tablet Take 1 tablet by mouth daily.   warfarin 2.5 MG tablet Commonly  known as: COUMADIN  Take 2.5 mg by mouth daily.        Contact information for follow-up providers     Sadie Manna, MD Follow up.   Specialty: Internal Medicine Why: hospital follow up Contact information: [address] [place] [address] [phone]         Lorelle Hussar, MD. Call.   Specialty: Orthopedic Surgery Contact  information: [address] [place] [address] [phone]              Contact information for after-discharge care     Destination     [facility] .   Service: Skilled Nursing Contact information: [address] [place]  [address] [phone]                    Discharge Exam: Cassandra Weiss   10/04/23 0806 10/04/23 2033 10/04/23 2145  Weight: 62.1 kg 58.2 kg 61.5 kg   Eyes: PERRL, lids and conjunctivae normal ENMT: Mucous membranes are moist. Posterior pharynx clear of any exudate or lesions.Normal dentition.  Neck: normal, supple, no masses, no thyromegaly Respiratory: clear to auscultation bilaterally, no wheezing, no crackles. Normal respiratory effort. No accessory muscle use.  Cardiovascular: Regular rate and rhythm, no murmurs  Abdomen: no tenderness, no masses palpated.  Musculoskeletal: Tenderness to right shoulder with limited range of motion Skin: no rashes, lesions, ulcers. No induration Neurologic: CN 2-12 grossly intact. Sensation intact, DTR normal.  Strength 4/5 on bilateral lower extremities below the hips Psychiatric: Normal judgment and insight. Alert and oriented x 3. Normal mood.   Condition at discharge: good  The results of significant diagnostics from this hospitalization (including imaging, microbiology, ancillary and laboratory) are listed below for reference.   Imaging Studies: MR SHOULDER RIGHT WO CONTRAST Result Date: 10/05/2023 CLINICAL DATA:  Clemens.  Right shoulder pain and weakness. EXAM: MRI OF THE RIGHT SHOULDER WITHOUT CONTRAST TECHNIQUE: Multiplanar, multisequence MR imaging of the shoulder was performed. No intravenous contrast was administered. COMPARISON:  Radiographs 10/04/2023 FINDINGS: Rotator cuff: No full-thickness retracted rotator cuff tear is identified. There is a complex cystic structure noted along the musculotendinous  junction region of the supraspinatus tendon anteriorly which is likely due to an articular surface tear or possibly a paralabral cyst. Muscles:  Age related muscular atrophy but no acute injury. Biceps long head:  Intact Acromioclavicular Joint: Moderate to advanced degenerative changes. Type 2-3 acromion. Mild lateral downsloping but no undersurface spurring. Glenohumeral Joint: Severe degenerative changes with full-thickness cartilage loss, marked joint space narrowing with bone-on-bone appearance anteriorly and probable bridging osteophytes. Large joint effusion and moderate synovitis. No acute fracture is identified. Labrum:  The labrum is completely torn. Bones:  No acute bony findings.  No fracture or bone contusion. Other: Significant subacromial/subdeltoid bursitis and also subcoracoid bursitis. IMPRESSION: 1. Severe glenohumeral joint degenerative changes as detailed above. 2. Large joint effusion and moderate synovitis. 3. No full-thickness retracted rotator cuff tear. There is a complex cystic structure noted along the musculotendinous junction region of the supraspinatus tendon anteriorly which is likely due to an articular surface tear or possibly a paralabral cyst. 4. Intact long head biceps tendon. 5. Moderate to advanced AC joint degenerative changes and type 2-3 acromion with mild lateral downsloping but no undersurface spurring. 6. Significant subacromial/subdeltoid bursitis and also subcoracoid bursitis. Electronically Signed   By: MYRTIS Stammer M.D.   On: 10/05/2023 22:39   CT Angio Chest PE  W and/or Wo Contrast Result Date: 10/04/2023 CLINICAL DATA:  89 year old female with weakness, fall from bed 2 or 3 days ago. Atrial fibrillation. Shortness of breath. EXAM: CT ANGIOGRAPHY CHEST WITH CONTRAST TECHNIQUE: Multidetector CT imaging of the chest was performed using the standard protocol during bolus administration of intravenous contrast. Multiplanar CT image reconstructions and MIPs were  obtained to evaluate the vascular anatomy. RADIATION DOSE REDUCTION: This exam was performed according to the departmental dose-optimization program which includes automated exposure control, adjustment of the mA and/or kV according to patient size and/or use of iterative reconstruction technique. CONTRAST:  50mL OMNIPAQUE  IOHEXOL  350 MG/ML SOLN COMPARISON:  CTA chest 02/14/2015. FINDINGS: Cardiovascular: Good contrast bolus timing in the pulmonary arterial tree. Middle and lower lobe respiratory motion obscuring detail of segmental and distal pulmonary arteries. Otherwise no pulmonary artery filling defect identified. No contrast in the aorta. Calcified aortic atherosclerosis. Cardiomegaly. No pericardial effusion. Calcified coronary artery atherosclerosis. Mediastinum/Nodes: Negative. No mediastinal mass or lymphadenopathy. Lungs/Pleura: Lower lung volumes compared to 2016. Major airways are patent. Mild dependent and lung base scarring or atelectasis. No pleural effusion, consolidation, or convincing active lung inflammation. Upper Abdomen: Left nephrolithiasis. Partially visible hyperdense renal cysts (no follow-up imaging recommended in the setting of significant comorbidities or limited life expectancy). Negative other visible noncontrast upper abdominal viscera. Musculoskeletal: No acute or suspicious osseous lesion. Review of the MIP images confirms the above findings. IMPRESSION: 1. No pulmonary embolus identified; respiratory motion obscures distal arteries at the lung bases. 2. Cardiomegaly. Calcified coronary artery and Aortic Atherosclerosis (ICD10-I70.0). 3. No acute or inflammatory pulmonary process. 4. Left nephrolithiasis. Electronically Signed   By: VEAR Hurst M.D.   On: 10/04/2023 10:18   CT Head Wo Contrast Result Date: 10/04/2023 CLINICAL DATA:  89 year old female with weakness, fall from bed 2 or 3 days ago. Atrial fibrillation. Shortness of breath. EXAM: CT HEAD WITHOUT CONTRAST TECHNIQUE:  Contiguous axial images were obtained from the base of the skull through the vertex without intravenous contrast. RADIATION DOSE REDUCTION: This exam was performed according to the departmental dose-optimization program which includes automated exposure control, adjustment of the mA and/or kV according to patient size and/or use of iterative reconstruction technique. COMPARISON:  Brain MRI 01/14/2018.  Head CT 01/13/2018. FINDINGS: Brain: Cerebral volume remains normal for age. No midline shift, ventriculomegaly, mass effect, evidence of mass lesion, intracranial hemorrhage or evidence of cortically based acute infarction. Patchy mild to moderate cerebral white matter hypodensity and associated deep gray nuclei heterogeneity not significantly changed. 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 Vascular: Calcified atherosclerosis at the skull base. No suspicious intracranial vascular hyperdensity. Skull: Stable.  No acute osseous abnormality identified. Sinuses/Orbits: Visualized paranasal sinuses and mastoids are stable and well aerated. Other: No acute orbit or scalp soft tissue finding identified. IMPRESSION: 1. No acute intracranial abnormality or acute traumatic injury identified. 2. Stable non contrast CT appearance of chronic small vessel disease since 2019. Electronically Signed   By: VEAR Hurst M.D.   On: 10/04/2023 10:13   DG Chest Portable 1 View Result Date: 10/04/2023 CLINICAL DATA:  Shortness of breath EXAM: PORTABLE CHEST 1 VIEW COMPARISON:  Chest radiograph dated 01/13/2018 FINDINGS: Low lung volumes with bronchovascular crowding. No focal consolidations. No pleural effusion or pneumothorax. Similar enlarged cardiomediastinal silhouette. Degenerative changes of the bilateral shoulders. No radiographic finding of acute displaced fracture. IMPRESSION: Low lung volumes with bronchovascular crowding. No focal consolidations. Electronically Signed   By: Limin  Xu M.D.   On:  10/04/2023 08:49   DG Shoulder Right Result Date: 10/04/2023 CLINICAL DATA:  Weakness and shoulder pain after a fall EXAM: RIGHT SHOULDER - 2+ VIEW COMPARISON:  None Available. FINDINGS: Visualized portion of the right hemithorax is normal. No acute fracture or dislocation. Advanced osteoarthritis about the glenohumeral joint with marked joint space narrowing and prominent osteophytes inferiorly. More mild acromioclavicular joint degenerative change. IMPRESSION: Advanced osteoarthritis, without acute osseous finding. Electronically Signed   By: Rockey Kilts M.D.   On: 10/04/2023 08:49    Microbiology: Results for orders placed or performed during the hospital encounter of 01/13/18  Urine Culture     Status: None   Collection Time: 01/13/18  4:59 PM   Specimen: Urine, Random  Result Value Ref Range Status   Specimen Description   Final    URINE, RANDOM Performed at [facility], [address] [place], [place] [address]    Special Requests   Final    NONE Performed at [facility], [address] [place], [place] [address]    Culture   Final    Multiple bacterial morphotypes present, none predominant. Suggest appropriate recollection if clinically indicated.   Report Status 01/15/2018 FINAL  Final    Labs: CBC: Recent Labs  Lab 10/04/23 0810 10/05/23 0345 10/06/23 0356 10/07/23 0312  WBC 13.2* 10.5 8.7 8.4  NEUTROABS 10.1*  --  5.9 6.0  HGB 14.7 12.3 12.4 12.3  HCT 45.9 38.2 38.8 38.5  MCV 83.0 82.7 85.3 84.8  PLT 220 188 209 251   Basic Metabolic Panel: Recent Labs  Lab 10/04/23 0810 10/05/23 0345 10/06/23 0356 10/07/23 0312  NA 134* 138 139 138  K 3.6 3.2* 4.0 4.1  CL 95* 106 107 108  CO2 24 24 25 23   GLUCOSE 108* 115* 91 111*  BUN 53* 39* 47* 55*  CREATININE 1.59* 1.07* 1.12* 1.09*  CALCIUM  9.4 8.5* 9.0 9.1  MG 2.0  --   --   --    Liver Function Tests: Recent Labs  Lab 10/04/23 0810 10/05/23 0345  AST 150* 86*  ALT 45* 33   ALKPHOS 62 47  BILITOT 1.6* 1.7*  PROT 7.4 5.8*  ALBUMIN 3.6 2.6*   CBG: No results for input(s): GLUCAP in the last 168 hours.  Discharge time spent:  36 minutes.  Signed: Drue ONEIDA Potter, MD [facility] 10/07/2023

## 2023-10-07 NOTE — Consult Note (Signed)
 PHARMACY - ANTICOAGULATION CONSULT NOTE  Pharmacy Consult for Warfarin Indication: atrial fibrillation  Allergies  Allergen Reactions   Penicillins Rash    Has patient had a PCN reaction causing immediate rash, facial/tongue/throat swelling, SOB or lightheadedness with hypotension: Unknown Has patient had a PCN reaction causing severe rash involving mucus membranes or skin necrosis: Unknown Has patient had a PCN reaction that required hospitalization: Unknown Has patient had a PCN reaction occurring within the last 10 years: Unknown If all of the above answers are NO, then may proceed with Cephalosporin use.     Patient Measurements: Height: 5' 2 (157.5 cm) Weight: 61.5 kg (135 lb 9.3 oz) IBW/kg (Calculated) : 50.1 HEPARIN DW (KG): 61.5  Vital Signs: Temp: 98.8 F (37.1 C) (07/18 0345) Temp Source: Oral (07/18 0345) BP: 140/58 (07/18 0345) Pulse Rate: 76 (07/18 0345)  Labs: Recent Labs    10/04/23 0810 10/05/23 0345 10/06/23 0356 10/07/23 0312  HGB 14.7 12.3 12.4 12.3  HCT 45.9 38.2 38.8 38.5  PLT 220 188 209 251  LABPROT 16.6* 19.9* 20.4* 23.4*  INR 1.3* 1.6* 1.7* 2.0*  CREATININE 1.59* 1.07* 1.12* 1.09*  CKTOTAL 5,991*  --   --   --     Estimated Creatinine Clearance: 30.2 mL/min (A) (by C-G formula based on SCr of 1.09 mg/dL (H)).   Medical History: Past Medical History:  Diagnosis Date   Arthritis    Atrial fibrillation (HCC)    Hypertension    Urge urinary incontinence     Pertinent Medications:  New dosing regimen implemented on 09/27/2023: Warfarin 2.5mg  Wednesdays, and 1.25mg  all other days Last dose: unknown  Assessment: 89 y.o. female with medical history significant of PAF on Coumadin , HTN, multiple always presented with worsening of right shoulder pain, ambulation difficulties. Per patient, fell on her left side on Saturday and hit her head, but no loss of consciousness and decided to stop taking warfarin because of this until she saw her  PCP. She can't remember when her last dose was, but knows she didn't take any warfarin this past weekend. Pharmacy has been consulted to re-initiate medication. See above for new dosing schedule per PCP Dr. Vande.   Goal of Therapy:  INR 2-3 Monitor platelets by anticoagulation protocol: Yes  Date:  INR: Dose:  7/15 1.3 2.5 mg 7/16 1.6 2 mg 7/17 1.7 2 mg 7/18 2.0 1.25 mg ordered   Plan:  INR is therapeutic this AM but at low end of goal No signs/symptoms of bleeding noted, CBC stable and WNL Give warfarin 1.25 mg (home dose) today Will continue bridge with lovenox  60 mg SQ BID (1 mg/kg BID) today - if INR remains therapeutic tomorrow, can discontinue lovenox  bridge Continue to monitor INR daily and adjust dose as appropriate CBC at least weekly  Thank you for involving pharmacy in this patient's care.   Damien Napoleon, PharmD Clinical Pharmacist 10/07/2023 7:24 AM

## 2023-10-07 NOTE — Plan of Care (Signed)

## 2023-10-07 NOTE — TOC Transition Note (Addendum)
 Transition of Care [facility]) - Discharge Note   Patient Details  Name: Cassandra Weiss MRN: [id number] Date of Birth: [birth year]  [facility]) CM/SW Contact:  Dalia GORMAN Fuse, RN Phone Number: 10/07/2023, 11:00 AM   Clinical Narrative:     Patient is medically clear to discharge to [facility]. TOC spoke with the patient's son [name] and they are in agreement with the discharge plan. [facility] will transport the patient.   Nurse to call report to [facility]  [phone]   Barriers to Discharge: Continued Medical Work up   Patient Goals and CMS Choice     Choice offered to / list presented to : Patient      Discharge Placement                       Discharge Plan and Services Additional resources added to the After Visit Summary for     Discharge Planning Services: CM Consult                                 Social Drivers of Health (SDOH) Interventions SDOH Screenings   Food Insecurity: No Food Insecurity (10/04/2023)  Housing: Low Risk  (10/04/2023)  Transportation Needs: No Transportation Needs (10/04/2023)  Utilities: Not At Risk (10/04/2023)  Financial Resource Strain: Low Risk  (08/17/2023)   Received from [facility]  Physical Activity: Insufficiently Active (01/13/2018)  Social Connections: Moderately Integrated (10/04/2023)  Stress: No Stress Concern Present (01/13/2018)  Tobacco Use: Low Risk  (10/06/2023)     Readmission Risk Interventions     No data to display

## 2023-10-10 DIAGNOSIS — N183 Chronic kidney disease, stage 3 unspecified: Secondary | ICD-10-CM | POA: Diagnosis not present

## 2023-10-10 DIAGNOSIS — N179 Acute kidney failure, unspecified: Secondary | ICD-10-CM | POA: Diagnosis not present

## 2023-10-10 DIAGNOSIS — M19011 Primary osteoarthritis, right shoulder: Secondary | ICD-10-CM | POA: Diagnosis not present

## 2023-10-10 DIAGNOSIS — W010XXD Fall on same level from slipping, tripping and stumbling without subsequent striking against object, subsequent encounter: Secondary | ICD-10-CM | POA: Diagnosis not present

## 2023-10-10 DIAGNOSIS — I4891 Unspecified atrial fibrillation: Secondary | ICD-10-CM | POA: Diagnosis not present

## 2023-10-10 DIAGNOSIS — G63 Polyneuropathy in diseases classified elsewhere: Secondary | ICD-10-CM | POA: Diagnosis not present

## 2023-10-10 DIAGNOSIS — I1310 Hypertensive heart and chronic kidney disease without heart failure, with stage 1 through stage 4 chronic kidney disease, or unspecified chronic kidney disease: Secondary | ICD-10-CM | POA: Diagnosis not present

## 2023-10-10 DIAGNOSIS — E7800 Pure hypercholesterolemia, unspecified: Secondary | ICD-10-CM | POA: Diagnosis not present

## 2023-10-11 DIAGNOSIS — R296 Repeated falls: Secondary | ICD-10-CM | POA: Diagnosis not present

## 2023-10-11 DIAGNOSIS — N179 Acute kidney failure, unspecified: Secondary | ICD-10-CM | POA: Diagnosis not present

## 2023-10-11 DIAGNOSIS — I1310 Hypertensive heart and chronic kidney disease without heart failure, with stage 1 through stage 4 chronic kidney disease, or unspecified chronic kidney disease: Secondary | ICD-10-CM | POA: Diagnosis not present

## 2023-10-11 DIAGNOSIS — E860 Dehydration: Secondary | ICD-10-CM | POA: Diagnosis not present

## 2023-10-11 DIAGNOSIS — I480 Paroxysmal atrial fibrillation: Secondary | ICD-10-CM | POA: Diagnosis not present

## 2023-10-11 DIAGNOSIS — R269 Unspecified abnormalities of gait and mobility: Secondary | ICD-10-CM | POA: Diagnosis not present

## 2023-10-11 DIAGNOSIS — G629 Polyneuropathy, unspecified: Secondary | ICD-10-CM | POA: Diagnosis not present

## 2023-10-11 DIAGNOSIS — F419 Anxiety disorder, unspecified: Secondary | ICD-10-CM | POA: Diagnosis not present

## 2023-10-11 DIAGNOSIS — R748 Abnormal levels of other serum enzymes: Secondary | ICD-10-CM | POA: Diagnosis not present

## 2023-10-14 DIAGNOSIS — N183 Chronic kidney disease, stage 3 unspecified: Secondary | ICD-10-CM | POA: Diagnosis not present

## 2023-10-14 DIAGNOSIS — I4891 Unspecified atrial fibrillation: Secondary | ICD-10-CM | POA: Diagnosis not present

## 2023-10-14 DIAGNOSIS — E860 Dehydration: Secondary | ICD-10-CM | POA: Diagnosis not present

## 2023-10-14 DIAGNOSIS — Z7901 Long term (current) use of anticoagulants: Secondary | ICD-10-CM | POA: Diagnosis not present

## 2023-10-14 DIAGNOSIS — I10 Essential (primary) hypertension: Secondary | ICD-10-CM | POA: Diagnosis not present

## 2023-10-14 DIAGNOSIS — G5793 Unspecified mononeuropathy of bilateral lower limbs: Secondary | ICD-10-CM | POA: Diagnosis not present

## 2023-10-17 DIAGNOSIS — I4891 Unspecified atrial fibrillation: Secondary | ICD-10-CM | POA: Diagnosis not present

## 2023-10-17 DIAGNOSIS — I10 Essential (primary) hypertension: Secondary | ICD-10-CM | POA: Diagnosis not present

## 2023-10-17 DIAGNOSIS — N183 Chronic kidney disease, stage 3 unspecified: Secondary | ICD-10-CM | POA: Diagnosis not present

## 2023-10-17 DIAGNOSIS — M19012 Primary osteoarthritis, left shoulder: Secondary | ICD-10-CM | POA: Diagnosis not present

## 2023-10-17 DIAGNOSIS — E860 Dehydration: Secondary | ICD-10-CM | POA: Diagnosis not present

## 2023-10-17 DIAGNOSIS — Z9181 History of falling: Secondary | ICD-10-CM | POA: Diagnosis not present

## 2023-10-19 DIAGNOSIS — I1310 Hypertensive heart and chronic kidney disease without heart failure, with stage 1 through stage 4 chronic kidney disease, or unspecified chronic kidney disease: Secondary | ICD-10-CM | POA: Diagnosis not present

## 2023-10-19 DIAGNOSIS — Z7901 Long term (current) use of anticoagulants: Secondary | ICD-10-CM | POA: Diagnosis not present

## 2023-10-19 DIAGNOSIS — N179 Acute kidney failure, unspecified: Secondary | ICD-10-CM | POA: Diagnosis not present

## 2023-10-19 DIAGNOSIS — I4891 Unspecified atrial fibrillation: Secondary | ICD-10-CM | POA: Diagnosis not present

## 2023-10-19 DIAGNOSIS — Z9181 History of falling: Secondary | ICD-10-CM | POA: Diagnosis not present

## 2023-10-19 DIAGNOSIS — G629 Polyneuropathy, unspecified: Secondary | ICD-10-CM | POA: Diagnosis not present

## 2023-10-19 DIAGNOSIS — M19011 Primary osteoarthritis, right shoulder: Secondary | ICD-10-CM | POA: Diagnosis not present

## 2023-10-19 DIAGNOSIS — N183 Chronic kidney disease, stage 3 unspecified: Secondary | ICD-10-CM | POA: Diagnosis not present

## 2023-10-19 DIAGNOSIS — I480 Paroxysmal atrial fibrillation: Secondary | ICD-10-CM | POA: Diagnosis not present

## 2023-10-21 DIAGNOSIS — I1310 Hypertensive heart and chronic kidney disease without heart failure, with stage 1 through stage 4 chronic kidney disease, or unspecified chronic kidney disease: Secondary | ICD-10-CM | POA: Diagnosis not present

## 2023-10-21 DIAGNOSIS — I4891 Unspecified atrial fibrillation: Secondary | ICD-10-CM | POA: Diagnosis not present

## 2023-10-21 DIAGNOSIS — Z9181 History of falling: Secondary | ICD-10-CM | POA: Diagnosis not present

## 2023-10-21 DIAGNOSIS — N183 Chronic kidney disease, stage 3 unspecified: Secondary | ICD-10-CM | POA: Diagnosis not present

## 2023-10-21 DIAGNOSIS — R0989 Other specified symptoms and signs involving the circulatory and respiratory systems: Secondary | ICD-10-CM | POA: Diagnosis not present

## 2023-10-21 DIAGNOSIS — L89153 Pressure ulcer of sacral region, stage 3: Secondary | ICD-10-CM | POA: Diagnosis not present

## 2023-10-21 DIAGNOSIS — Z7901 Long term (current) use of anticoagulants: Secondary | ICD-10-CM | POA: Diagnosis not present

## 2023-10-24 DIAGNOSIS — M19011 Primary osteoarthritis, right shoulder: Secondary | ICD-10-CM | POA: Diagnosis not present

## 2023-10-24 DIAGNOSIS — I509 Heart failure, unspecified: Secondary | ICD-10-CM | POA: Diagnosis not present

## 2023-10-24 DIAGNOSIS — L89153 Pressure ulcer of sacral region, stage 3: Secondary | ICD-10-CM | POA: Diagnosis not present

## 2023-10-24 DIAGNOSIS — N183 Chronic kidney disease, stage 3 unspecified: Secondary | ICD-10-CM | POA: Diagnosis not present

## 2023-10-24 DIAGNOSIS — I130 Hypertensive heart and chronic kidney disease with heart failure and stage 1 through stage 4 chronic kidney disease, or unspecified chronic kidney disease: Secondary | ICD-10-CM | POA: Diagnosis not present

## 2023-10-24 DIAGNOSIS — I4891 Unspecified atrial fibrillation: Secondary | ICD-10-CM | POA: Diagnosis not present

## 2023-10-24 DIAGNOSIS — Z7901 Long term (current) use of anticoagulants: Secondary | ICD-10-CM | POA: Diagnosis not present

## 2023-10-28 ENCOUNTER — Emergency Department
Admission: EM | Admit: 2023-10-28 | Discharge: 2023-10-29 | Disposition: A | Discharge status: Home / Self Care | Attending: Emergency Medicine | Admitting: Emergency Medicine

## 2023-10-28 ENCOUNTER — Other Ambulatory Visit: Payer: Self-pay

## 2023-10-28 DIAGNOSIS — I110 Hypertensive heart disease with heart failure: Secondary | ICD-10-CM | POA: Insufficient documentation

## 2023-10-28 DIAGNOSIS — Z79899 Other long term (current) drug therapy: Secondary | ICD-10-CM | POA: Diagnosis not present

## 2023-10-28 DIAGNOSIS — N183 Chronic kidney disease, stage 3 unspecified: Secondary | ICD-10-CM | POA: Diagnosis not present

## 2023-10-28 DIAGNOSIS — L988 Other specified disorders of the skin and subcutaneous tissue: Secondary | ICD-10-CM | POA: Diagnosis not present

## 2023-10-28 DIAGNOSIS — L97421 Non-pressure chronic ulcer of left heel and midfoot limited to breakdown of skin: Secondary | ICD-10-CM | POA: Diagnosis not present

## 2023-10-28 DIAGNOSIS — I130 Hypertensive heart and chronic kidney disease with heart failure and stage 1 through stage 4 chronic kidney disease, or unspecified chronic kidney disease: Secondary | ICD-10-CM | POA: Diagnosis not present

## 2023-10-28 DIAGNOSIS — I10 Essential (primary) hypertension: Secondary | ICD-10-CM | POA: Insufficient documentation

## 2023-10-28 DIAGNOSIS — I509 Heart failure, unspecified: Secondary | ICD-10-CM | POA: Insufficient documentation

## 2023-10-28 DIAGNOSIS — R140 Abdominal distension (gaseous): Secondary | ICD-10-CM | POA: Diagnosis not present

## 2023-10-28 DIAGNOSIS — Z9181 History of falling: Secondary | ICD-10-CM | POA: Diagnosis not present

## 2023-10-28 DIAGNOSIS — R1084 Generalized abdominal pain: Secondary | ICD-10-CM | POA: Diagnosis not present

## 2023-10-28 DIAGNOSIS — R339 Retention of urine, unspecified: Secondary | ICD-10-CM | POA: Diagnosis not present

## 2023-10-28 DIAGNOSIS — Z7901 Long term (current) use of anticoagulants: Secondary | ICD-10-CM | POA: Diagnosis not present

## 2023-10-28 DIAGNOSIS — F0390 Unspecified dementia without behavioral disturbance: Secondary | ICD-10-CM | POA: Insufficient documentation

## 2023-10-28 DIAGNOSIS — I4891 Unspecified atrial fibrillation: Secondary | ICD-10-CM | POA: Diagnosis not present

## 2023-10-28 DIAGNOSIS — M19011 Primary osteoarthritis, right shoulder: Secondary | ICD-10-CM | POA: Diagnosis not present

## 2023-10-28 DIAGNOSIS — L89153 Pressure ulcer of sacral region, stage 3: Secondary | ICD-10-CM | POA: Diagnosis not present

## 2023-10-28 LAB — CBC
HCT: 33.1 % — ABNORMAL LOW (ref 36.0–46.0)
Hemoglobin: 10.7 g/dL — ABNORMAL LOW (ref 12.0–15.0)
MCH: 27.3 pg (ref 26.0–34.0)
MCHC: 32.3 g/dL (ref 30.0–36.0)
MCV: 84.4 fL (ref 80.0–100.0)
Platelets: 317 K/uL (ref 150–400)
RBC: 3.92 MIL/uL (ref 3.87–5.11)
RDW: 16.8 % — ABNORMAL HIGH (ref 11.5–15.5)
WBC: 6.1 K/uL (ref 4.0–10.5)
nRBC: 0 % (ref 0.0–0.2)

## 2023-10-28 LAB — BASIC METABOLIC PANEL WITH GFR
Anion gap: 9 (ref 5–15)
BUN: 28 mg/dL — ABNORMAL HIGH (ref 8–23)
CO2: 27 mmol/L (ref 22–32)
Calcium: 9.2 mg/dL (ref 8.9–10.3)
Chloride: 105 mmol/L (ref 98–111)
Creatinine, Ser: 1.05 mg/dL — ABNORMAL HIGH (ref 0.44–1.00)
GFR, Estimated: 51 mL/min — ABNORMAL LOW (ref >60)
Glucose, Bld: 106 mg/dL — ABNORMAL HIGH (ref 70–99)
Potassium: 3.6 mmol/L (ref 3.5–5.1)
Sodium: 141 mmol/L (ref 135–145)

## 2023-10-28 LAB — URINALYSIS, ROUTINE W REFLEX MICROSCOPIC
Bilirubin Urine: NEGATIVE
Glucose, UA: NEGATIVE mg/dL
Hgb urine dipstick: NEGATIVE
Ketones, ur: NEGATIVE mg/dL
Leukocytes,Ua: NEGATIVE
Nitrite: NEGATIVE
Protein, ur: NEGATIVE mg/dL
Specific Gravity, Urine: 1.006 (ref 1.005–1.030)
pH: 6 (ref 5.0–8.0)

## 2023-10-28 LAB — BRAIN NATRIURETIC PEPTIDE: B Natriuretic Peptide: 722.7 pg/mL — ABNORMAL HIGH (ref 0.0–100.0)

## 2023-10-28 NOTE — ED Provider Notes (Signed)
 [facility] Provider Note   Event Date/Time   First MD Initiated Contact with Patient 10/28/23 1646     (approximate) History  Urinary Retention  HPI Cassandra Weiss is a 89 y.o. female with a past medical history of hypertension, atrial fibrillation, and mild dementia who presents from skilled nursing facility with concerns for urinary retention.  Patient denies any complaints at this time.  Bladder scan in triage did show 600 with tense suprapubic region.  Patient does admit that she does not feel the urge to pee ROS: Patient currently denies any vision changes, tinnitus, difficulty speaking, facial droop, sore throat, chest pain, shortness of breath, abdominal pain, nausea/vomiting/diarrhea, dysuria, or weakness/numbness/paresthesias in any extremity   Physical Exam  Triage Vital Signs: ED Triage Vitals [10/28/23 1457]  Encounter Vitals Group     BP 121/60     Girls Systolic BP Percentile      Girls Diastolic BP Percentile      Boys Systolic BP Percentile      Boys Diastolic BP Percentile      Pulse Rate 74     Resp 16     Temp 98 F (36.7 C)     Temp Source Oral     SpO2 98 %     Weight 134 lb 7.7 oz (61 kg)     Height 5' 2 (1.575 m)     Head Circumference      Peak Flow      Pain Score 0     Pain Loc      Pain Education      Exclude from Growth Chart    Most recent vital signs: Vitals:   10/28/23 2000 10/28/23 2115  BP: (!) 154/74 (!) 129/47  Pulse: 79 76  Resp:  18  Temp:    SpO2: 97% 96%   General: Awake, oriented x4. CV:  Good peripheral perfusion. Resp:  Normal effort. Abd:  No distention.  Tense round mass appreciated in suprapubic region Other:  Elderly well-developed, well-nourished African-American female resting comfortably in no acute distress ED Results / Procedures / Treatments  Labs (all labs ordered are listed, but only abnormal results are displayed) Labs Reviewed  URINALYSIS, ROUTINE W REFLEX MICROSCOPIC - Abnormal;  Notable for the following components:      Result Value   Color, Urine STRAW (*)    APPearance CLEAR (*)    All other components within normal limits  BASIC METABOLIC PANEL WITH GFR - Abnormal; Notable for the following components:   Glucose, Bld 106 (*)    BUN 28 (*)    Creatinine, Ser 1.05 (*)    GFR, Estimated 51 (*)    All other components within normal limits  CBC - Abnormal; Notable for the following components:   Hemoglobin 10.7 (*)    HCT 33.1 (*)    RDW 16.8 (*)    All other components within normal limits  BRAIN NATRIURETIC PEPTIDE - Abnormal; Notable for the following components:   B Natriuretic Peptide 722.7 (*)    All other components within normal limits   PROCEDURES: Critical Care performed: No Procedures MEDICATIONS ORDERED IN ED: Medications - No data to display IMPRESSION / MDM / ASSESSMENT AND PLAN / ED COURSE  I reviewed the triage vital signs and the nursing notes.                             The patient  is on the cardiac monitor to evaluate for evidence of arrhythmia and/or significant heart rate changes. Patient's presentation is most consistent with acute presentation with potential threat to life or bodily function. 89 year old female with one day of acute urinary retention. Well appearing/nonseptic. Tolerating PO.  ED Workup: UA ED Interventions: Catheter placement  Patient exam and history not consistent with cauda equina, infectious etiology, constipation based retention/intraabdominal mass/AAA, trauma, nephro/urolithiasis, drug reaction, cancer.  Disposition: Discharge with catheter placed to leg bag and urology follow up within 1 week. Strict return precautions and catheter care discussed.   FINAL CLINICAL IMPRESSION(S) / ED DIAGNOSES   Final diagnoses:  Urinary retention   Rx / DC Orders   ED Discharge Orders     None      Note:  This document was prepared using Dragon voice recognition software and may include unintentional  dictation errors.   Javari Bufkin K, MD 10/28/23 [phone]

## 2023-10-28 NOTE — ED Triage Notes (Signed)
 First nurse note: Pt to ED via [facility] from [facility]. Facility reported abd pain and distention. Facility also reports retaining urine  HR 69 96% RA 122/53 CBG 120 97.9 oral

## 2023-10-28 NOTE — ED Triage Notes (Signed)
 Pt is able to answer some questions as asked and is not confused. Pt  denies any pain and any recent falls. Pt is in no acute distress and in recliner in triage. Fall bundle placed.

## 2023-10-28 NOTE — ED Notes (Signed)
 This RN attempted foley. Unsuccessfull.

## 2023-10-28 NOTE — ED Provider Triage Note (Addendum)
 Emergency Medicine Provider Triage Evaluation Note  Cassandra Weiss , a 89 y.o. female  was evaluated in triage.  Pt complains of urinary retention.  Patient came from [facility].  Son is here with her.  He endorses patient has history of cardiac heart failure, pulmonary edema.  Review of Systems  Positive:  Negative:  Physical Exam  BP 121/60   Pulse 74   Temp 98 F (36.7 C) (Oral)   Resp 16   Ht 5' 2 (1.575 m)   Wt 61 kg   SpO2 98%   BMI 24.60 kg/m during triage patient was with normal vital signs Gen:   Awake, no distress   Resp:  Normal effort  MSK:   Moves extremities without difficulty  Other:    Medical Decision Making  Medically screening exam initiated at 3:49 PM.  Appropriate orders placed.  DEBI COUSIN was informed that the remainder of the evaluation will be completed by another provider, this initial triage assessment does not replace that evaluation, and the importance of remaining in the ED until their evaluation is complete.  Patient with history of cardiac heart failure, with possible urinary retention.  History of pulmonary edema.  Ordered BMP CBC UA BMP, bladder scan   Bladder scan showed 840, nurses are going to place urinary catheter. Janit Kast, PA-C 10/28/23 1550    Janit Kast, PA-C 10/28/23 1551

## 2023-10-28 NOTE — ED Notes (Signed)
 Per the son. Her BNP at the facility was over 6000 and her abdomen is hard.

## 2023-10-28 NOTE — ED Notes (Signed)
 Attempted blood. Unsuccessful. Lab called.

## 2023-10-28 NOTE — ED Notes (Signed)
 [facility] was called /spoke with Pam to transport patient to [facility] will send a truck as soon as Materials engineer

## 2023-10-28 NOTE — ED Notes (Addendum)
 Second attempt of foley for second RN Sam. Unsuccessful.

## 2023-10-29 DIAGNOSIS — I959 Hypotension, unspecified: Secondary | ICD-10-CM | POA: Diagnosis not present

## 2023-10-29 DIAGNOSIS — Z7401 Bed confinement status: Secondary | ICD-10-CM | POA: Diagnosis not present

## 2023-10-29 DIAGNOSIS — I509 Heart failure, unspecified: Secondary | ICD-10-CM | POA: Diagnosis not present

## 2023-10-29 DIAGNOSIS — I10 Essential (primary) hypertension: Secondary | ICD-10-CM | POA: Diagnosis not present

## 2023-10-31 DIAGNOSIS — R140 Abdominal distension (gaseous): Secondary | ICD-10-CM | POA: Diagnosis not present

## 2023-10-31 DIAGNOSIS — D509 Iron deficiency anemia, unspecified: Secondary | ICD-10-CM | POA: Diagnosis not present

## 2023-10-31 DIAGNOSIS — R339 Retention of urine, unspecified: Secondary | ICD-10-CM | POA: Diagnosis not present

## 2023-10-31 DIAGNOSIS — N183 Chronic kidney disease, stage 3 unspecified: Secondary | ICD-10-CM | POA: Diagnosis not present

## 2023-10-31 DIAGNOSIS — I509 Heart failure, unspecified: Secondary | ICD-10-CM | POA: Diagnosis not present

## 2023-10-31 DIAGNOSIS — D649 Anemia, unspecified: Secondary | ICD-10-CM | POA: Diagnosis not present

## 2023-11-01 DIAGNOSIS — D649 Anemia, unspecified: Secondary | ICD-10-CM | POA: Diagnosis not present

## 2023-11-02 DIAGNOSIS — L97422 Non-pressure chronic ulcer of left heel and midfoot with fat layer exposed: Secondary | ICD-10-CM | POA: Diagnosis not present

## 2023-11-02 DIAGNOSIS — Z7901 Long term (current) use of anticoagulants: Secondary | ICD-10-CM | POA: Diagnosis not present

## 2023-11-02 DIAGNOSIS — L988 Other specified disorders of the skin and subcutaneous tissue: Secondary | ICD-10-CM | POA: Diagnosis not present

## 2023-11-02 DIAGNOSIS — Z79899 Other long term (current) drug therapy: Secondary | ICD-10-CM | POA: Diagnosis not present

## 2023-11-03 DIAGNOSIS — D649 Anemia, unspecified: Secondary | ICD-10-CM | POA: Diagnosis not present

## 2023-11-04 DIAGNOSIS — Z79899 Other long term (current) drug therapy: Secondary | ICD-10-CM | POA: Diagnosis not present

## 2023-11-04 DIAGNOSIS — I4891 Unspecified atrial fibrillation: Secondary | ICD-10-CM | POA: Diagnosis not present

## 2023-11-06 DIAGNOSIS — Z79899 Other long term (current) drug therapy: Secondary | ICD-10-CM | POA: Diagnosis not present

## 2023-11-07 DIAGNOSIS — I4891 Unspecified atrial fibrillation: Secondary | ICD-10-CM | POA: Diagnosis not present

## 2023-11-07 DIAGNOSIS — Z9181 History of falling: Secondary | ICD-10-CM | POA: Diagnosis not present

## 2023-11-07 DIAGNOSIS — R339 Retention of urine, unspecified: Secondary | ICD-10-CM | POA: Diagnosis not present

## 2023-11-07 DIAGNOSIS — N183 Chronic kidney disease, stage 3 unspecified: Secondary | ICD-10-CM | POA: Diagnosis not present

## 2023-11-07 DIAGNOSIS — Z7901 Long term (current) use of anticoagulants: Secondary | ICD-10-CM | POA: Diagnosis not present

## 2023-11-07 DIAGNOSIS — I509 Heart failure, unspecified: Secondary | ICD-10-CM | POA: Diagnosis not present

## 2023-11-07 DIAGNOSIS — L89153 Pressure ulcer of sacral region, stage 3: Secondary | ICD-10-CM | POA: Diagnosis not present

## 2023-11-07 DIAGNOSIS — M19011 Primary osteoarthritis, right shoulder: Secondary | ICD-10-CM | POA: Diagnosis not present

## 2023-11-07 DIAGNOSIS — I130 Hypertensive heart and chronic kidney disease with heart failure and stage 1 through stage 4 chronic kidney disease, or unspecified chronic kidney disease: Secondary | ICD-10-CM | POA: Diagnosis not present

## 2023-11-08 DIAGNOSIS — I509 Heart failure, unspecified: Secondary | ICD-10-CM | POA: Diagnosis not present

## 2023-11-08 DIAGNOSIS — I480 Paroxysmal atrial fibrillation: Secondary | ICD-10-CM | POA: Diagnosis not present

## 2023-11-08 DIAGNOSIS — Z7901 Long term (current) use of anticoagulants: Secondary | ICD-10-CM | POA: Diagnosis not present

## 2023-11-09 DIAGNOSIS — I130 Hypertensive heart and chronic kidney disease with heart failure and stage 1 through stage 4 chronic kidney disease, or unspecified chronic kidney disease: Secondary | ICD-10-CM | POA: Diagnosis not present

## 2023-11-09 DIAGNOSIS — D631 Anemia in chronic kidney disease: Secondary | ICD-10-CM | POA: Diagnosis not present

## 2023-11-09 DIAGNOSIS — I480 Paroxysmal atrial fibrillation: Secondary | ICD-10-CM | POA: Diagnosis not present

## 2023-11-09 DIAGNOSIS — I2510 Atherosclerotic heart disease of native coronary artery without angina pectoris: Secondary | ICD-10-CM | POA: Diagnosis not present

## 2023-11-09 DIAGNOSIS — L89616 Pressure-induced deep tissue damage of right heel: Secondary | ICD-10-CM | POA: Diagnosis not present

## 2023-11-09 DIAGNOSIS — I509 Heart failure, unspecified: Secondary | ICD-10-CM | POA: Diagnosis not present

## 2023-11-09 DIAGNOSIS — N183 Chronic kidney disease, stage 3 unspecified: Secondary | ICD-10-CM | POA: Diagnosis not present

## 2023-11-09 DIAGNOSIS — L89312 Pressure ulcer of right buttock, stage 2: Secondary | ICD-10-CM | POA: Diagnosis not present

## 2023-11-09 DIAGNOSIS — E785 Hyperlipidemia, unspecified: Secondary | ICD-10-CM | POA: Diagnosis not present

## 2023-11-10 DIAGNOSIS — N183 Chronic kidney disease, stage 3 unspecified: Secondary | ICD-10-CM | POA: Diagnosis not present

## 2023-11-10 DIAGNOSIS — D631 Anemia in chronic kidney disease: Secondary | ICD-10-CM | POA: Diagnosis not present

## 2023-11-10 DIAGNOSIS — I130 Hypertensive heart and chronic kidney disease with heart failure and stage 1 through stage 4 chronic kidney disease, or unspecified chronic kidney disease: Secondary | ICD-10-CM | POA: Diagnosis not present

## 2023-11-10 DIAGNOSIS — I2510 Atherosclerotic heart disease of native coronary artery without angina pectoris: Secondary | ICD-10-CM | POA: Diagnosis not present

## 2023-11-10 DIAGNOSIS — I480 Paroxysmal atrial fibrillation: Secondary | ICD-10-CM | POA: Diagnosis not present

## 2023-11-10 DIAGNOSIS — I509 Heart failure, unspecified: Secondary | ICD-10-CM | POA: Diagnosis not present

## 2023-11-10 DIAGNOSIS — E785 Hyperlipidemia, unspecified: Secondary | ICD-10-CM | POA: Diagnosis not present

## 2023-11-10 DIAGNOSIS — L89616 Pressure-induced deep tissue damage of right heel: Secondary | ICD-10-CM | POA: Diagnosis not present

## 2023-11-10 DIAGNOSIS — L89312 Pressure ulcer of right buttock, stage 2: Secondary | ICD-10-CM | POA: Diagnosis not present

## 2023-11-11 DIAGNOSIS — I2510 Atherosclerotic heart disease of native coronary artery without angina pectoris: Secondary | ICD-10-CM | POA: Diagnosis not present

## 2023-11-11 DIAGNOSIS — D631 Anemia in chronic kidney disease: Secondary | ICD-10-CM | POA: Diagnosis not present

## 2023-11-11 DIAGNOSIS — I130 Hypertensive heart and chronic kidney disease with heart failure and stage 1 through stage 4 chronic kidney disease, or unspecified chronic kidney disease: Secondary | ICD-10-CM | POA: Diagnosis not present

## 2023-11-11 DIAGNOSIS — I509 Heart failure, unspecified: Secondary | ICD-10-CM | POA: Diagnosis not present

## 2023-11-11 DIAGNOSIS — I480 Paroxysmal atrial fibrillation: Secondary | ICD-10-CM | POA: Diagnosis not present

## 2023-11-11 DIAGNOSIS — L89616 Pressure-induced deep tissue damage of right heel: Secondary | ICD-10-CM | POA: Diagnosis not present

## 2023-11-11 DIAGNOSIS — E785 Hyperlipidemia, unspecified: Secondary | ICD-10-CM | POA: Diagnosis not present

## 2023-11-11 DIAGNOSIS — N183 Chronic kidney disease, stage 3 unspecified: Secondary | ICD-10-CM | POA: Diagnosis not present

## 2023-11-11 DIAGNOSIS — L89312 Pressure ulcer of right buttock, stage 2: Secondary | ICD-10-CM | POA: Diagnosis not present

## 2023-11-14 DIAGNOSIS — I2510 Atherosclerotic heart disease of native coronary artery without angina pectoris: Secondary | ICD-10-CM | POA: Diagnosis not present

## 2023-11-14 DIAGNOSIS — I480 Paroxysmal atrial fibrillation: Secondary | ICD-10-CM | POA: Diagnosis not present

## 2023-11-14 DIAGNOSIS — D631 Anemia in chronic kidney disease: Secondary | ICD-10-CM | POA: Diagnosis not present

## 2023-11-14 DIAGNOSIS — L89312 Pressure ulcer of right buttock, stage 2: Secondary | ICD-10-CM | POA: Diagnosis not present

## 2023-11-14 DIAGNOSIS — E785 Hyperlipidemia, unspecified: Secondary | ICD-10-CM | POA: Diagnosis not present

## 2023-11-14 DIAGNOSIS — L89616 Pressure-induced deep tissue damage of right heel: Secondary | ICD-10-CM | POA: Diagnosis not present

## 2023-11-14 DIAGNOSIS — I509 Heart failure, unspecified: Secondary | ICD-10-CM | POA: Diagnosis not present

## 2023-11-14 DIAGNOSIS — I130 Hypertensive heart and chronic kidney disease with heart failure and stage 1 through stage 4 chronic kidney disease, or unspecified chronic kidney disease: Secondary | ICD-10-CM | POA: Diagnosis not present

## 2023-11-14 DIAGNOSIS — N183 Chronic kidney disease, stage 3 unspecified: Secondary | ICD-10-CM | POA: Diagnosis not present

## 2023-11-15 DIAGNOSIS — E785 Hyperlipidemia, unspecified: Secondary | ICD-10-CM | POA: Diagnosis not present

## 2023-11-15 DIAGNOSIS — L89312 Pressure ulcer of right buttock, stage 2: Secondary | ICD-10-CM | POA: Diagnosis not present

## 2023-11-15 DIAGNOSIS — I480 Paroxysmal atrial fibrillation: Secondary | ICD-10-CM | POA: Diagnosis not present

## 2023-11-15 DIAGNOSIS — I130 Hypertensive heart and chronic kidney disease with heart failure and stage 1 through stage 4 chronic kidney disease, or unspecified chronic kidney disease: Secondary | ICD-10-CM | POA: Diagnosis not present

## 2023-11-15 DIAGNOSIS — I2510 Atherosclerotic heart disease of native coronary artery without angina pectoris: Secondary | ICD-10-CM | POA: Diagnosis not present

## 2023-11-15 DIAGNOSIS — L89616 Pressure-induced deep tissue damage of right heel: Secondary | ICD-10-CM | POA: Diagnosis not present

## 2023-11-15 DIAGNOSIS — D631 Anemia in chronic kidney disease: Secondary | ICD-10-CM | POA: Diagnosis not present

## 2023-11-15 DIAGNOSIS — N183 Chronic kidney disease, stage 3 unspecified: Secondary | ICD-10-CM | POA: Diagnosis not present

## 2023-11-15 DIAGNOSIS — I509 Heart failure, unspecified: Secondary | ICD-10-CM | POA: Diagnosis not present

## 2023-11-16 DIAGNOSIS — D631 Anemia in chronic kidney disease: Secondary | ICD-10-CM | POA: Diagnosis not present

## 2023-11-16 DIAGNOSIS — L89312 Pressure ulcer of right buttock, stage 2: Secondary | ICD-10-CM | POA: Diagnosis not present

## 2023-11-16 DIAGNOSIS — E785 Hyperlipidemia, unspecified: Secondary | ICD-10-CM | POA: Diagnosis not present

## 2023-11-16 DIAGNOSIS — N183 Chronic kidney disease, stage 3 unspecified: Secondary | ICD-10-CM | POA: Diagnosis not present

## 2023-11-16 DIAGNOSIS — I480 Paroxysmal atrial fibrillation: Secondary | ICD-10-CM | POA: Diagnosis not present

## 2023-11-16 DIAGNOSIS — I130 Hypertensive heart and chronic kidney disease with heart failure and stage 1 through stage 4 chronic kidney disease, or unspecified chronic kidney disease: Secondary | ICD-10-CM | POA: Diagnosis not present

## 2023-11-16 DIAGNOSIS — I509 Heart failure, unspecified: Secondary | ICD-10-CM | POA: Diagnosis not present

## 2023-11-16 DIAGNOSIS — I2510 Atherosclerotic heart disease of native coronary artery without angina pectoris: Secondary | ICD-10-CM | POA: Diagnosis not present

## 2023-11-16 DIAGNOSIS — L89616 Pressure-induced deep tissue damage of right heel: Secondary | ICD-10-CM | POA: Diagnosis not present

## 2023-11-17 DIAGNOSIS — I509 Heart failure, unspecified: Secondary | ICD-10-CM | POA: Diagnosis not present

## 2023-11-17 DIAGNOSIS — I130 Hypertensive heart and chronic kidney disease with heart failure and stage 1 through stage 4 chronic kidney disease, or unspecified chronic kidney disease: Secondary | ICD-10-CM | POA: Diagnosis not present

## 2023-11-17 DIAGNOSIS — L89616 Pressure-induced deep tissue damage of right heel: Secondary | ICD-10-CM | POA: Diagnosis not present

## 2023-11-17 DIAGNOSIS — I2510 Atherosclerotic heart disease of native coronary artery without angina pectoris: Secondary | ICD-10-CM | POA: Diagnosis not present

## 2023-11-17 DIAGNOSIS — L89312 Pressure ulcer of right buttock, stage 2: Secondary | ICD-10-CM | POA: Diagnosis not present

## 2023-11-17 DIAGNOSIS — D631 Anemia in chronic kidney disease: Secondary | ICD-10-CM | POA: Diagnosis not present

## 2023-11-17 DIAGNOSIS — N183 Chronic kidney disease, stage 3 unspecified: Secondary | ICD-10-CM | POA: Diagnosis not present

## 2023-11-17 DIAGNOSIS — I480 Paroxysmal atrial fibrillation: Secondary | ICD-10-CM | POA: Diagnosis not present

## 2023-11-18 DIAGNOSIS — I509 Heart failure, unspecified: Secondary | ICD-10-CM | POA: Diagnosis not present

## 2023-11-18 DIAGNOSIS — L89616 Pressure-induced deep tissue damage of right heel: Secondary | ICD-10-CM | POA: Diagnosis not present

## 2023-11-18 DIAGNOSIS — L89312 Pressure ulcer of right buttock, stage 2: Secondary | ICD-10-CM | POA: Diagnosis not present

## 2023-11-18 DIAGNOSIS — E785 Hyperlipidemia, unspecified: Secondary | ICD-10-CM | POA: Diagnosis not present

## 2023-11-18 DIAGNOSIS — I480 Paroxysmal atrial fibrillation: Secondary | ICD-10-CM | POA: Diagnosis not present

## 2023-11-18 DIAGNOSIS — I130 Hypertensive heart and chronic kidney disease with heart failure and stage 1 through stage 4 chronic kidney disease, or unspecified chronic kidney disease: Secondary | ICD-10-CM | POA: Diagnosis not present

## 2023-11-18 DIAGNOSIS — D631 Anemia in chronic kidney disease: Secondary | ICD-10-CM | POA: Diagnosis not present

## 2023-11-18 DIAGNOSIS — N183 Chronic kidney disease, stage 3 unspecified: Secondary | ICD-10-CM | POA: Diagnosis not present

## 2023-11-18 DIAGNOSIS — I2510 Atherosclerotic heart disease of native coronary artery without angina pectoris: Secondary | ICD-10-CM | POA: Diagnosis not present

## 2023-11-21 DIAGNOSIS — L89616 Pressure-induced deep tissue damage of right heel: Secondary | ICD-10-CM | POA: Diagnosis not present

## 2023-11-21 DIAGNOSIS — I130 Hypertensive heart and chronic kidney disease with heart failure and stage 1 through stage 4 chronic kidney disease, or unspecified chronic kidney disease: Secondary | ICD-10-CM | POA: Diagnosis not present

## 2023-11-21 DIAGNOSIS — I509 Heart failure, unspecified: Secondary | ICD-10-CM | POA: Diagnosis not present

## 2023-11-21 DIAGNOSIS — L89312 Pressure ulcer of right buttock, stage 2: Secondary | ICD-10-CM | POA: Diagnosis not present

## 2023-11-21 DIAGNOSIS — E785 Hyperlipidemia, unspecified: Secondary | ICD-10-CM | POA: Diagnosis not present

## 2023-11-21 DIAGNOSIS — I2510 Atherosclerotic heart disease of native coronary artery without angina pectoris: Secondary | ICD-10-CM | POA: Diagnosis not present

## 2023-11-21 DIAGNOSIS — I480 Paroxysmal atrial fibrillation: Secondary | ICD-10-CM | POA: Diagnosis not present

## 2023-11-21 DIAGNOSIS — N183 Chronic kidney disease, stage 3 unspecified: Secondary | ICD-10-CM | POA: Diagnosis not present

## 2023-11-21 DIAGNOSIS — D631 Anemia in chronic kidney disease: Secondary | ICD-10-CM | POA: Diagnosis not present

## 2023-11-22 DIAGNOSIS — I130 Hypertensive heart and chronic kidney disease with heart failure and stage 1 through stage 4 chronic kidney disease, or unspecified chronic kidney disease: Secondary | ICD-10-CM | POA: Diagnosis not present

## 2023-11-22 DIAGNOSIS — I509 Heart failure, unspecified: Secondary | ICD-10-CM | POA: Diagnosis not present

## 2023-11-22 DIAGNOSIS — L89312 Pressure ulcer of right buttock, stage 2: Secondary | ICD-10-CM | POA: Diagnosis not present

## 2023-11-22 DIAGNOSIS — L89616 Pressure-induced deep tissue damage of right heel: Secondary | ICD-10-CM | POA: Diagnosis not present

## 2023-11-22 DIAGNOSIS — I480 Paroxysmal atrial fibrillation: Secondary | ICD-10-CM | POA: Diagnosis not present

## 2023-11-22 DIAGNOSIS — N183 Chronic kidney disease, stage 3 unspecified: Secondary | ICD-10-CM | POA: Diagnosis not present

## 2023-11-22 DIAGNOSIS — I2510 Atherosclerotic heart disease of native coronary artery without angina pectoris: Secondary | ICD-10-CM | POA: Diagnosis not present

## 2023-11-22 DIAGNOSIS — E785 Hyperlipidemia, unspecified: Secondary | ICD-10-CM | POA: Diagnosis not present

## 2023-11-22 DIAGNOSIS — D631 Anemia in chronic kidney disease: Secondary | ICD-10-CM | POA: Diagnosis not present

## 2023-11-24 DIAGNOSIS — D631 Anemia in chronic kidney disease: Secondary | ICD-10-CM | POA: Diagnosis not present

## 2023-11-24 DIAGNOSIS — I509 Heart failure, unspecified: Secondary | ICD-10-CM | POA: Diagnosis not present

## 2023-11-24 DIAGNOSIS — N183 Chronic kidney disease, stage 3 unspecified: Secondary | ICD-10-CM | POA: Diagnosis not present

## 2023-11-24 DIAGNOSIS — L89616 Pressure-induced deep tissue damage of right heel: Secondary | ICD-10-CM | POA: Diagnosis not present

## 2023-11-24 DIAGNOSIS — I480 Paroxysmal atrial fibrillation: Secondary | ICD-10-CM | POA: Diagnosis not present

## 2023-11-24 DIAGNOSIS — L89312 Pressure ulcer of right buttock, stage 2: Secondary | ICD-10-CM | POA: Diagnosis not present

## 2023-11-24 DIAGNOSIS — E785 Hyperlipidemia, unspecified: Secondary | ICD-10-CM | POA: Diagnosis not present

## 2023-11-24 DIAGNOSIS — I2510 Atherosclerotic heart disease of native coronary artery without angina pectoris: Secondary | ICD-10-CM | POA: Diagnosis not present

## 2023-11-24 DIAGNOSIS — I130 Hypertensive heart and chronic kidney disease with heart failure and stage 1 through stage 4 chronic kidney disease, or unspecified chronic kidney disease: Secondary | ICD-10-CM | POA: Diagnosis not present

## 2023-11-25 DIAGNOSIS — E785 Hyperlipidemia, unspecified: Secondary | ICD-10-CM | POA: Diagnosis not present

## 2023-11-25 DIAGNOSIS — I480 Paroxysmal atrial fibrillation: Secondary | ICD-10-CM | POA: Diagnosis not present

## 2023-11-25 DIAGNOSIS — D631 Anemia in chronic kidney disease: Secondary | ICD-10-CM | POA: Diagnosis not present

## 2023-11-25 DIAGNOSIS — I509 Heart failure, unspecified: Secondary | ICD-10-CM | POA: Diagnosis not present

## 2023-11-25 DIAGNOSIS — I130 Hypertensive heart and chronic kidney disease with heart failure and stage 1 through stage 4 chronic kidney disease, or unspecified chronic kidney disease: Secondary | ICD-10-CM | POA: Diagnosis not present

## 2023-11-25 DIAGNOSIS — L89616 Pressure-induced deep tissue damage of right heel: Secondary | ICD-10-CM | POA: Diagnosis not present

## 2023-11-25 DIAGNOSIS — N183 Chronic kidney disease, stage 3 unspecified: Secondary | ICD-10-CM | POA: Diagnosis not present

## 2023-11-25 DIAGNOSIS — I2510 Atherosclerotic heart disease of native coronary artery without angina pectoris: Secondary | ICD-10-CM | POA: Diagnosis not present

## 2023-11-25 DIAGNOSIS — L89312 Pressure ulcer of right buttock, stage 2: Secondary | ICD-10-CM | POA: Diagnosis not present

## 2023-11-28 DIAGNOSIS — I480 Paroxysmal atrial fibrillation: Secondary | ICD-10-CM | POA: Diagnosis not present

## 2023-11-28 DIAGNOSIS — L89312 Pressure ulcer of right buttock, stage 2: Secondary | ICD-10-CM | POA: Diagnosis not present

## 2023-11-28 DIAGNOSIS — I130 Hypertensive heart and chronic kidney disease with heart failure and stage 1 through stage 4 chronic kidney disease, or unspecified chronic kidney disease: Secondary | ICD-10-CM | POA: Diagnosis not present

## 2023-11-28 DIAGNOSIS — I2510 Atherosclerotic heart disease of native coronary artery without angina pectoris: Secondary | ICD-10-CM | POA: Diagnosis not present

## 2023-11-28 DIAGNOSIS — I509 Heart failure, unspecified: Secondary | ICD-10-CM | POA: Diagnosis not present

## 2023-11-28 DIAGNOSIS — L89616 Pressure-induced deep tissue damage of right heel: Secondary | ICD-10-CM | POA: Diagnosis not present

## 2023-11-28 DIAGNOSIS — N183 Chronic kidney disease, stage 3 unspecified: Secondary | ICD-10-CM | POA: Diagnosis not present

## 2023-11-28 DIAGNOSIS — E785 Hyperlipidemia, unspecified: Secondary | ICD-10-CM | POA: Diagnosis not present

## 2023-11-28 DIAGNOSIS — D631 Anemia in chronic kidney disease: Secondary | ICD-10-CM | POA: Diagnosis not present

## 2023-11-29 DIAGNOSIS — D631 Anemia in chronic kidney disease: Secondary | ICD-10-CM | POA: Diagnosis not present

## 2023-11-29 DIAGNOSIS — N183 Chronic kidney disease, stage 3 unspecified: Secondary | ICD-10-CM | POA: Diagnosis not present

## 2023-11-29 DIAGNOSIS — I480 Paroxysmal atrial fibrillation: Secondary | ICD-10-CM | POA: Diagnosis not present

## 2023-11-29 DIAGNOSIS — I509 Heart failure, unspecified: Secondary | ICD-10-CM | POA: Diagnosis not present

## 2023-11-29 DIAGNOSIS — I2510 Atherosclerotic heart disease of native coronary artery without angina pectoris: Secondary | ICD-10-CM | POA: Diagnosis not present

## 2023-11-29 DIAGNOSIS — E785 Hyperlipidemia, unspecified: Secondary | ICD-10-CM | POA: Diagnosis not present

## 2023-11-29 DIAGNOSIS — I130 Hypertensive heart and chronic kidney disease with heart failure and stage 1 through stage 4 chronic kidney disease, or unspecified chronic kidney disease: Secondary | ICD-10-CM | POA: Diagnosis not present

## 2023-11-29 DIAGNOSIS — L89312 Pressure ulcer of right buttock, stage 2: Secondary | ICD-10-CM | POA: Diagnosis not present

## 2023-11-29 DIAGNOSIS — L89616 Pressure-induced deep tissue damage of right heel: Secondary | ICD-10-CM | POA: Diagnosis not present

## 2023-11-30 DIAGNOSIS — I2510 Atherosclerotic heart disease of native coronary artery without angina pectoris: Secondary | ICD-10-CM | POA: Diagnosis not present

## 2023-11-30 DIAGNOSIS — D631 Anemia in chronic kidney disease: Secondary | ICD-10-CM | POA: Diagnosis not present

## 2023-11-30 DIAGNOSIS — I130 Hypertensive heart and chronic kidney disease with heart failure and stage 1 through stage 4 chronic kidney disease, or unspecified chronic kidney disease: Secondary | ICD-10-CM | POA: Diagnosis not present

## 2023-11-30 DIAGNOSIS — E785 Hyperlipidemia, unspecified: Secondary | ICD-10-CM | POA: Diagnosis not present

## 2023-11-30 DIAGNOSIS — N183 Chronic kidney disease, stage 3 unspecified: Secondary | ICD-10-CM | POA: Diagnosis not present

## 2023-11-30 DIAGNOSIS — L89616 Pressure-induced deep tissue damage of right heel: Secondary | ICD-10-CM | POA: Diagnosis not present

## 2023-11-30 DIAGNOSIS — I480 Paroxysmal atrial fibrillation: Secondary | ICD-10-CM | POA: Diagnosis not present

## 2023-11-30 DIAGNOSIS — I509 Heart failure, unspecified: Secondary | ICD-10-CM | POA: Diagnosis not present

## 2023-11-30 DIAGNOSIS — L89312 Pressure ulcer of right buttock, stage 2: Secondary | ICD-10-CM | POA: Diagnosis not present

## 2023-12-02 DIAGNOSIS — N183 Chronic kidney disease, stage 3 unspecified: Secondary | ICD-10-CM | POA: Diagnosis not present

## 2023-12-02 DIAGNOSIS — I2510 Atherosclerotic heart disease of native coronary artery without angina pectoris: Secondary | ICD-10-CM | POA: Diagnosis not present

## 2023-12-02 DIAGNOSIS — I509 Heart failure, unspecified: Secondary | ICD-10-CM | POA: Diagnosis not present

## 2023-12-02 DIAGNOSIS — L89616 Pressure-induced deep tissue damage of right heel: Secondary | ICD-10-CM | POA: Diagnosis not present

## 2023-12-02 DIAGNOSIS — I130 Hypertensive heart and chronic kidney disease with heart failure and stage 1 through stage 4 chronic kidney disease, or unspecified chronic kidney disease: Secondary | ICD-10-CM | POA: Diagnosis not present

## 2023-12-02 DIAGNOSIS — D631 Anemia in chronic kidney disease: Secondary | ICD-10-CM | POA: Diagnosis not present

## 2023-12-02 DIAGNOSIS — E785 Hyperlipidemia, unspecified: Secondary | ICD-10-CM | POA: Diagnosis not present

## 2023-12-02 DIAGNOSIS — I480 Paroxysmal atrial fibrillation: Secondary | ICD-10-CM | POA: Diagnosis not present

## 2023-12-02 DIAGNOSIS — L89312 Pressure ulcer of right buttock, stage 2: Secondary | ICD-10-CM | POA: Diagnosis not present

## 2023-12-06 DIAGNOSIS — R339 Retention of urine, unspecified: Secondary | ICD-10-CM | POA: Diagnosis not present

## 2023-12-06 DIAGNOSIS — R413 Other amnesia: Secondary | ICD-10-CM | POA: Diagnosis not present

## 2023-12-06 DIAGNOSIS — N183 Chronic kidney disease, stage 3 unspecified: Secondary | ICD-10-CM | POA: Diagnosis not present

## 2023-12-06 DIAGNOSIS — I130 Hypertensive heart and chronic kidney disease with heart failure and stage 1 through stage 4 chronic kidney disease, or unspecified chronic kidney disease: Secondary | ICD-10-CM | POA: Diagnosis not present

## 2023-12-06 DIAGNOSIS — L89616 Pressure-induced deep tissue damage of right heel: Secondary | ICD-10-CM | POA: Diagnosis not present

## 2023-12-06 DIAGNOSIS — I2510 Atherosclerotic heart disease of native coronary artery without angina pectoris: Secondary | ICD-10-CM | POA: Diagnosis not present

## 2023-12-06 DIAGNOSIS — I4891 Unspecified atrial fibrillation: Secondary | ICD-10-CM | POA: Diagnosis not present

## 2023-12-06 DIAGNOSIS — I480 Paroxysmal atrial fibrillation: Secondary | ICD-10-CM | POA: Diagnosis not present

## 2023-12-06 DIAGNOSIS — D631 Anemia in chronic kidney disease: Secondary | ICD-10-CM | POA: Diagnosis not present

## 2023-12-06 DIAGNOSIS — I509 Heart failure, unspecified: Secondary | ICD-10-CM | POA: Diagnosis not present

## 2023-12-06 DIAGNOSIS — R262 Difficulty in walking, not elsewhere classified: Secondary | ICD-10-CM | POA: Diagnosis not present

## 2023-12-06 DIAGNOSIS — E785 Hyperlipidemia, unspecified: Secondary | ICD-10-CM | POA: Diagnosis not present

## 2023-12-06 DIAGNOSIS — Z7901 Long term (current) use of anticoagulants: Secondary | ICD-10-CM | POA: Diagnosis not present

## 2023-12-06 DIAGNOSIS — Z978 Presence of other specified devices: Secondary | ICD-10-CM | POA: Diagnosis not present

## 2023-12-06 DIAGNOSIS — Z5181 Encounter for therapeutic drug level monitoring: Secondary | ICD-10-CM | POA: Diagnosis not present

## 2023-12-06 DIAGNOSIS — L89312 Pressure ulcer of right buttock, stage 2: Secondary | ICD-10-CM | POA: Diagnosis not present

## 2023-12-07 DIAGNOSIS — I509 Heart failure, unspecified: Secondary | ICD-10-CM | POA: Diagnosis not present

## 2023-12-07 DIAGNOSIS — N183 Chronic kidney disease, stage 3 unspecified: Secondary | ICD-10-CM | POA: Diagnosis not present

## 2023-12-07 DIAGNOSIS — E785 Hyperlipidemia, unspecified: Secondary | ICD-10-CM | POA: Diagnosis not present

## 2023-12-07 DIAGNOSIS — I130 Hypertensive heart and chronic kidney disease with heart failure and stage 1 through stage 4 chronic kidney disease, or unspecified chronic kidney disease: Secondary | ICD-10-CM | POA: Diagnosis not present

## 2023-12-07 DIAGNOSIS — D631 Anemia in chronic kidney disease: Secondary | ICD-10-CM | POA: Diagnosis not present

## 2023-12-07 DIAGNOSIS — I480 Paroxysmal atrial fibrillation: Secondary | ICD-10-CM | POA: Diagnosis not present

## 2023-12-07 DIAGNOSIS — L89312 Pressure ulcer of right buttock, stage 2: Secondary | ICD-10-CM | POA: Diagnosis not present

## 2023-12-07 DIAGNOSIS — I2510 Atherosclerotic heart disease of native coronary artery without angina pectoris: Secondary | ICD-10-CM | POA: Diagnosis not present

## 2023-12-07 DIAGNOSIS — L89616 Pressure-induced deep tissue damage of right heel: Secondary | ICD-10-CM | POA: Diagnosis not present

## 2023-12-08 ENCOUNTER — Inpatient Hospital Stay
Admission: EM | Admit: 2023-12-08 | Discharge: 2023-12-20 | DRG: 871 | Disposition: A | Discharge status: Home Health | Attending: Internal Medicine | Admitting: Internal Medicine

## 2023-12-08 ENCOUNTER — Emergency Department

## 2023-12-08 DIAGNOSIS — N183 Chronic kidney disease, stage 3 unspecified: Secondary | ICD-10-CM | POA: Diagnosis not present

## 2023-12-08 DIAGNOSIS — T148XXA Other injury of unspecified body region, initial encounter: Secondary | ICD-10-CM | POA: Diagnosis not present

## 2023-12-08 DIAGNOSIS — M2540 Effusion, unspecified joint: Secondary | ICD-10-CM | POA: Diagnosis not present

## 2023-12-08 DIAGNOSIS — E870 Hyperosmolality and hypernatremia: Secondary | ICD-10-CM | POA: Diagnosis not present

## 2023-12-08 DIAGNOSIS — Z789 Other specified health status: Secondary | ICD-10-CM | POA: Diagnosis not present

## 2023-12-08 DIAGNOSIS — B9620 Unspecified Escherichia coli [E. coli] as the cause of diseases classified elsewhere: Secondary | ICD-10-CM | POA: Diagnosis present

## 2023-12-08 DIAGNOSIS — D631 Anemia in chronic kidney disease: Secondary | ICD-10-CM | POA: Diagnosis present

## 2023-12-08 DIAGNOSIS — R931 Abnormal findings on diagnostic imaging of heart and coronary circulation: Secondary | ICD-10-CM | POA: Diagnosis not present

## 2023-12-08 DIAGNOSIS — N390 Urinary tract infection, site not specified: Secondary | ICD-10-CM | POA: Diagnosis present

## 2023-12-08 DIAGNOSIS — N200 Calculus of kidney: Secondary | ICD-10-CM | POA: Diagnosis not present

## 2023-12-08 DIAGNOSIS — I69351 Hemiplegia and hemiparesis following cerebral infarction affecting right dominant side: Secondary | ICD-10-CM | POA: Diagnosis not present

## 2023-12-08 DIAGNOSIS — E8809 Other disorders of plasma-protein metabolism, not elsewhere classified: Secondary | ICD-10-CM | POA: Diagnosis present

## 2023-12-08 DIAGNOSIS — N3941 Urge incontinence: Secondary | ICD-10-CM | POA: Diagnosis present

## 2023-12-08 DIAGNOSIS — R6520 Severe sepsis without septic shock: Secondary | ICD-10-CM | POA: Diagnosis present

## 2023-12-08 DIAGNOSIS — D509 Iron deficiency anemia, unspecified: Secondary | ICD-10-CM | POA: Diagnosis present

## 2023-12-08 DIAGNOSIS — R1312 Dysphagia, oropharyngeal phase: Secondary | ICD-10-CM

## 2023-12-08 DIAGNOSIS — A419 Sepsis, unspecified organism: Secondary | ICD-10-CM | POA: Diagnosis not present

## 2023-12-08 DIAGNOSIS — I10 Essential (primary) hypertension: Secondary | ICD-10-CM | POA: Diagnosis present

## 2023-12-08 DIAGNOSIS — K449 Diaphragmatic hernia without obstruction or gangrene: Secondary | ICD-10-CM | POA: Diagnosis present

## 2023-12-08 DIAGNOSIS — I771 Stricture of artery: Secondary | ICD-10-CM | POA: Diagnosis not present

## 2023-12-08 DIAGNOSIS — E8721 Acute metabolic acidosis: Secondary | ICD-10-CM | POA: Diagnosis present

## 2023-12-08 DIAGNOSIS — I509 Heart failure, unspecified: Secondary | ICD-10-CM | POA: Diagnosis not present

## 2023-12-08 DIAGNOSIS — I4819 Other persistent atrial fibrillation: Secondary | ICD-10-CM | POA: Diagnosis not present

## 2023-12-08 DIAGNOSIS — G9341 Metabolic encephalopathy: Secondary | ICD-10-CM | POA: Diagnosis present

## 2023-12-08 DIAGNOSIS — Z7901 Long term (current) use of anticoagulants: Secondary | ICD-10-CM

## 2023-12-08 DIAGNOSIS — T45515A Adverse effect of anticoagulants, initial encounter: Secondary | ICD-10-CM | POA: Diagnosis present

## 2023-12-08 DIAGNOSIS — I130 Hypertensive heart and chronic kidney disease with heart failure and stage 1 through stage 4 chronic kidney disease, or unspecified chronic kidney disease: Secondary | ICD-10-CM | POA: Diagnosis not present

## 2023-12-08 DIAGNOSIS — D6859 Other primary thrombophilia: Secondary | ICD-10-CM | POA: Diagnosis not present

## 2023-12-08 DIAGNOSIS — M25511 Pain in right shoulder: Secondary | ICD-10-CM | POA: Diagnosis not present

## 2023-12-08 DIAGNOSIS — N189 Chronic kidney disease, unspecified: Secondary | ICD-10-CM | POA: Diagnosis not present

## 2023-12-08 DIAGNOSIS — I129 Hypertensive chronic kidney disease with stage 1 through stage 4 chronic kidney disease, or unspecified chronic kidney disease: Secondary | ICD-10-CM | POA: Diagnosis present

## 2023-12-08 DIAGNOSIS — R5381 Other malaise: Secondary | ICD-10-CM

## 2023-12-08 DIAGNOSIS — M2041 Other hammer toe(s) (acquired), right foot: Secondary | ICD-10-CM | POA: Diagnosis not present

## 2023-12-08 DIAGNOSIS — M7989 Other specified soft tissue disorders: Secondary | ICD-10-CM | POA: Diagnosis not present

## 2023-12-08 DIAGNOSIS — Z66 Do not resuscitate: Secondary | ICD-10-CM | POA: Diagnosis not present

## 2023-12-08 DIAGNOSIS — Z452 Encounter for adjustment and management of vascular access device: Secondary | ICD-10-CM | POA: Diagnosis not present

## 2023-12-08 DIAGNOSIS — A4101 Sepsis due to Methicillin susceptible Staphylococcus aureus: Secondary | ICD-10-CM | POA: Diagnosis not present

## 2023-12-08 DIAGNOSIS — Z880 Allergy status to penicillin: Secondary | ICD-10-CM

## 2023-12-08 DIAGNOSIS — N1831 Chronic kidney disease, stage 3a: Secondary | ICD-10-CM | POA: Diagnosis present

## 2023-12-08 DIAGNOSIS — N179 Acute kidney failure, unspecified: Secondary | ICD-10-CM | POA: Diagnosis present

## 2023-12-08 DIAGNOSIS — L89152 Pressure ulcer of sacral region, stage 2: Secondary | ICD-10-CM | POA: Diagnosis present

## 2023-12-08 DIAGNOSIS — I959 Hypotension, unspecified: Secondary | ICD-10-CM | POA: Diagnosis not present

## 2023-12-08 DIAGNOSIS — R531 Weakness: Secondary | ICD-10-CM | POA: Diagnosis not present

## 2023-12-08 DIAGNOSIS — R0989 Other specified symptoms and signs involving the circulatory and respiratory systems: Secondary | ICD-10-CM | POA: Diagnosis not present

## 2023-12-08 DIAGNOSIS — M1711 Unilateral primary osteoarthritis, right knee: Secondary | ICD-10-CM | POA: Diagnosis not present

## 2023-12-08 DIAGNOSIS — M19011 Primary osteoarthritis, right shoulder: Secondary | ICD-10-CM | POA: Diagnosis present

## 2023-12-08 DIAGNOSIS — E860 Dehydration: Secondary | ICD-10-CM | POA: Diagnosis not present

## 2023-12-08 DIAGNOSIS — M009 Pyogenic arthritis, unspecified: Secondary | ICD-10-CM

## 2023-12-08 DIAGNOSIS — I700 Atherosclerosis of aorta: Secondary | ICD-10-CM | POA: Diagnosis not present

## 2023-12-08 DIAGNOSIS — R680 Hypothermia, not associated with low environmental temperature: Secondary | ICD-10-CM | POA: Diagnosis present

## 2023-12-08 DIAGNOSIS — B9561 Methicillin susceptible Staphylococcus aureus infection as the cause of diseases classified elsewhere: Secondary | ICD-10-CM | POA: Diagnosis not present

## 2023-12-08 DIAGNOSIS — M85811 Other specified disorders of bone density and structure, right shoulder: Secondary | ICD-10-CM | POA: Diagnosis not present

## 2023-12-08 DIAGNOSIS — Z823 Family history of stroke: Secondary | ICD-10-CM

## 2023-12-08 DIAGNOSIS — I480 Paroxysmal atrial fibrillation: Secondary | ICD-10-CM | POA: Diagnosis present

## 2023-12-08 DIAGNOSIS — M25411 Effusion, right shoulder: Secondary | ICD-10-CM | POA: Diagnosis present

## 2023-12-08 DIAGNOSIS — E876 Hypokalemia: Secondary | ICD-10-CM | POA: Diagnosis present

## 2023-12-08 DIAGNOSIS — M25561 Pain in right knee: Secondary | ICD-10-CM | POA: Diagnosis not present

## 2023-12-08 DIAGNOSIS — Z515 Encounter for palliative care: Secondary | ICD-10-CM | POA: Diagnosis not present

## 2023-12-08 DIAGNOSIS — I2510 Atherosclerotic heart disease of native coronary artery without angina pectoris: Secondary | ICD-10-CM | POA: Diagnosis present

## 2023-12-08 DIAGNOSIS — R404 Transient alteration of awareness: Secondary | ICD-10-CM | POA: Diagnosis not present

## 2023-12-08 DIAGNOSIS — L89616 Pressure-induced deep tissue damage of right heel: Secondary | ICD-10-CM | POA: Diagnosis not present

## 2023-12-08 DIAGNOSIS — K8689 Other specified diseases of pancreas: Secondary | ICD-10-CM | POA: Diagnosis not present

## 2023-12-08 DIAGNOSIS — Z6824 Body mass index (BMI) 24.0-24.9, adult: Secondary | ICD-10-CM

## 2023-12-08 DIAGNOSIS — L89312 Pressure ulcer of right buttock, stage 2: Secondary | ICD-10-CM | POA: Diagnosis not present

## 2023-12-08 DIAGNOSIS — L89159 Pressure ulcer of sacral region, unspecified stage: Secondary | ICD-10-CM | POA: Diagnosis not present

## 2023-12-08 DIAGNOSIS — M79671 Pain in right foot: Secondary | ICD-10-CM | POA: Diagnosis not present

## 2023-12-08 DIAGNOSIS — E785 Hyperlipidemia, unspecified: Secondary | ICD-10-CM | POA: Diagnosis not present

## 2023-12-08 DIAGNOSIS — E43 Unspecified severe protein-calorie malnutrition: Secondary | ICD-10-CM | POA: Diagnosis present

## 2023-12-08 DIAGNOSIS — D649 Anemia, unspecified: Secondary | ICD-10-CM | POA: Diagnosis not present

## 2023-12-08 DIAGNOSIS — R791 Abnormal coagulation profile: Secondary | ICD-10-CM | POA: Diagnosis present

## 2023-12-08 DIAGNOSIS — R7881 Bacteremia: Secondary | ICD-10-CM | POA: Diagnosis not present

## 2023-12-08 DIAGNOSIS — Z7401 Bed confinement status: Secondary | ICD-10-CM | POA: Diagnosis not present

## 2023-12-08 DIAGNOSIS — R4182 Altered mental status, unspecified: Principal | ICD-10-CM | POA: Diagnosis present

## 2023-12-08 DIAGNOSIS — Z7189 Other specified counseling: Secondary | ICD-10-CM | POA: Diagnosis not present

## 2023-12-08 DIAGNOSIS — Z79899 Other long term (current) drug therapy: Secondary | ICD-10-CM

## 2023-12-08 DIAGNOSIS — K429 Umbilical hernia without obstruction or gangrene: Secondary | ICD-10-CM | POA: Diagnosis not present

## 2023-12-08 DIAGNOSIS — Z8249 Family history of ischemic heart disease and other diseases of the circulatory system: Secondary | ICD-10-CM

## 2023-12-08 DIAGNOSIS — I517 Cardiomegaly: Secondary | ICD-10-CM | POA: Diagnosis not present

## 2023-12-08 DIAGNOSIS — M13811 Other specified arthritis, right shoulder: Secondary | ICD-10-CM | POA: Diagnosis not present

## 2023-12-08 DIAGNOSIS — M79605 Pain in left leg: Secondary | ICD-10-CM | POA: Diagnosis not present

## 2023-12-08 DIAGNOSIS — M79604 Pain in right leg: Secondary | ICD-10-CM | POA: Diagnosis not present

## 2023-12-08 DIAGNOSIS — R4189 Other symptoms and signs involving cognitive functions and awareness: Secondary | ICD-10-CM | POA: Diagnosis present

## 2023-12-08 LAB — COMPREHENSIVE METABOLIC PANEL WITH GFR
ALT: 14 U/L (ref 0–44)
AST: 24 U/L (ref 15–41)
Albumin: 2.7 g/dL — ABNORMAL LOW (ref 3.5–5.0)
Alkaline Phosphatase: 48 U/L (ref 38–126)
Anion gap: 13 (ref 5–15)
BUN: 50 mg/dL — ABNORMAL HIGH (ref 8–23)
CO2: 28 mmol/L (ref 22–32)
Calcium: 9.5 mg/dL (ref 8.9–10.3)
Chloride: 115 mmol/L — ABNORMAL HIGH (ref 98–111)
Creatinine, Ser: 1.71 mg/dL — ABNORMAL HIGH (ref 0.44–1.00)
GFR, Estimated: 28 mL/min — ABNORMAL LOW (ref >60)
Glucose, Bld: 104 mg/dL — ABNORMAL HIGH (ref 70–99)
Potassium: 4.1 mmol/L (ref 3.5–5.1)
Sodium: 156 mmol/L — ABNORMAL HIGH (ref 135–145)
Total Bilirubin: 0.8 mg/dL (ref 0.0–1.2)
Total Protein: 6.5 g/dL (ref 6.5–8.1)

## 2023-12-08 LAB — URINALYSIS, COMPLETE (UACMP) WITH MICROSCOPIC
Bilirubin Urine: NEGATIVE
Glucose, UA: NEGATIVE mg/dL
Ketones, ur: NEGATIVE mg/dL
Nitrite: POSITIVE — AB
Protein, ur: 100 mg/dL — AB
RBC / HPF: >50 RBC/hpf (ref 0–5)
Specific Gravity, Urine: 1.014 (ref 1.005–1.030)
WBC, UA: >50 WBC/hpf (ref 0–5)
pH: 7 (ref 5.0–8.0)

## 2023-12-08 LAB — CBC WITH DIFFERENTIAL/PLATELET
Abs Immature Granulocytes: 0.03 K/uL (ref 0.00–0.07)
Basophils Absolute: 0 K/uL (ref 0.0–0.1)
Basophils Relative: 1 %
Eosinophils Absolute: 0.1 K/uL (ref 0.0–0.5)
Eosinophils Relative: 1 %
HCT: 31.1 % — ABNORMAL LOW (ref 36.0–46.0)
Hemoglobin: 9.3 g/dL — ABNORMAL LOW (ref 12.0–15.0)
Immature Granulocytes: 0 %
Lymphocytes Relative: 15 %
Lymphs Abs: 1.3 K/uL (ref 0.7–4.0)
MCH: 26.6 pg (ref 26.0–34.0)
MCHC: 29.9 g/dL — ABNORMAL LOW (ref 30.0–36.0)
MCV: 89.1 fL (ref 80.0–100.0)
Monocytes Absolute: 0.6 K/uL (ref 0.1–1.0)
Monocytes Relative: 7 %
Neutro Abs: 6.5 K/uL (ref 1.7–7.7)
Neutrophils Relative %: 76 %
Platelets: 334 K/uL (ref 150–400)
RBC: 3.49 MIL/uL — ABNORMAL LOW (ref 3.87–5.11)
RDW: 24.2 % — ABNORMAL HIGH (ref 11.5–15.5)
WBC: 8.5 K/uL (ref 4.0–10.5)
nRBC: 0 % (ref 0.0–0.2)

## 2023-12-08 LAB — PROTIME-INR
INR: 8.8 (ref 0.8–1.2)
Prothrombin Time: 75.4 s — ABNORMAL HIGH (ref 11.4–15.2)

## 2023-12-08 LAB — TSH: TSH: 1.432 u[IU]/mL (ref 0.350–4.500)

## 2023-12-08 LAB — RESP PANEL BY RT-PCR (RSV, FLU A&B, COVID)  RVPGX2
Influenza A by PCR: NEGATIVE
Influenza B by PCR: NEGATIVE
Resp Syncytial Virus by PCR: NEGATIVE
SARS Coronavirus 2 by RT PCR: NEGATIVE

## 2023-12-08 LAB — LIPASE, BLOOD: Lipase: 49 U/L (ref 11–51)

## 2023-12-08 LAB — TROPONIN I (HIGH SENSITIVITY)
Troponin I (High Sensitivity): 94 ng/L — ABNORMAL HIGH (ref <18)
Troponin I (High Sensitivity): 89 ng/L — ABNORMAL HIGH (ref <18)

## 2023-12-08 LAB — LACTIC ACID, PLASMA: Lactic Acid, Venous: 1.7 mmol/L (ref 0.5–1.9)

## 2023-12-08 MED ORDER — ATORVASTATIN CALCIUM 20 MG PO TABS
80.0000 mg | ORAL_TABLET | Freq: Every day | ORAL | Status: DC
  Administered 2023-12-10 – 2023-12-19 (×9): 80 mg via ORAL
  Filled 2023-12-08 (×11): qty 4

## 2023-12-08 MED ORDER — AMLODIPINE BESYLATE 5 MG PO TABS: 5.0000 mg | ORAL_TABLET | Freq: Every day | ORAL | Status: DC

## 2023-12-08 MED ORDER — HALOPERIDOL LACTATE 5 MG/ML IJ SOLN
3.0000 mg | Freq: Four times a day (QID) | INTRAMUSCULAR | Status: DC | PRN
  Administered 2023-12-08: 3 mg via INTRAVENOUS
  Filled 2023-12-08: qty 1

## 2023-12-08 MED ORDER — SODIUM CHLORIDE 0.9 % IV BOLUS
500.0000 mL | Freq: Once | INTRAVENOUS | Status: AC
  Administered 2023-12-08: 500 mL via INTRAVENOUS

## 2023-12-08 MED ORDER — LACTATED RINGERS IV SOLN
150.0000 mL/h | INTRAVENOUS | Status: DC
  Administered 2023-12-08: 150 mL/h via INTRAVENOUS

## 2023-12-08 MED ORDER — ACETAMINOPHEN 650 MG RE SUPP: 650.0000 mg | Freq: Four times a day (QID) | RECTAL | Status: DC | PRN

## 2023-12-08 MED ORDER — HYDRALAZINE HCL 50 MG PO TABS
50.0000 mg | ORAL_TABLET | Freq: Three times a day (TID) | ORAL | Status: DC
Start: 2023-12-09 — End: 2023-12-09

## 2023-12-08 MED ORDER — ALPRAZOLAM 0.5 MG PO TABS
0.5000 mg | ORAL_TABLET | Freq: Every evening | ORAL | Status: DC | PRN
Start: 2023-12-08 — End: 2023-12-20

## 2023-12-08 MED ORDER — ADULT MULTIVITAMIN W/MINERALS CH
1.0000 | ORAL_TABLET | Freq: Every day | ORAL | Status: DC
Start: 2023-12-09 — End: 2023-12-20
  Administered 2023-12-10 – 2023-12-19 (×8): 1 via ORAL
  Filled 2023-12-08 (×10): qty 1

## 2023-12-08 MED ORDER — RESTORE SILVER DRESSING 2"X2" EX PADS: 1.0000 | MEDICATED_PAD | Freq: Every day | CUTANEOUS | Status: DC

## 2023-12-08 MED ORDER — SODIUM CHLORIDE 0.9 % IV SOLN: 2.0000 g | INTRAVENOUS | Status: DC

## 2023-12-08 MED ORDER — CARVEDILOL 25 MG PO TABS
25.0000 mg | ORAL_TABLET | Freq: Two times a day (BID) | ORAL | Status: DC
  Administered 2023-12-09 – 2023-12-19 (×13): 25 mg via ORAL
  Filled 2023-12-08 (×17): qty 1

## 2023-12-08 MED ORDER — ONDANSETRON HCL 4 MG PO TABS: 4.0000 mg | ORAL_TABLET | Freq: Four times a day (QID) | ORAL | Status: DC | PRN

## 2023-12-08 MED ORDER — ACETAMINOPHEN 325 MG PO TABS
650.0000 mg | ORAL_TABLET | Freq: Four times a day (QID) | ORAL | Status: DC | PRN
  Administered 2023-12-12 – 2023-12-14 (×2): 650 mg via ORAL
  Filled 2023-12-08 (×3): qty 2

## 2023-12-08 MED ORDER — KETOROLAC TROMETHAMINE 15 MG/ML IJ SOLN: 15.0000 mg | Freq: Four times a day (QID) | INTRAMUSCULAR | Status: DC | PRN

## 2023-12-08 MED ORDER — ONDANSETRON HCL 4 MG/2ML IJ SOLN
4.0000 mg | Freq: Four times a day (QID) | INTRAMUSCULAR | Status: DC | PRN
  Filled 2023-12-08: qty 2

## 2023-12-08 MED ORDER — ACETAMINOPHEN 325 MG PO TABS
650.0000 mg | ORAL_TABLET | Freq: Four times a day (QID) | ORAL | Status: DC | PRN
Start: 2023-12-08 — End: 2023-12-08

## 2023-12-08 MED ORDER — SODIUM CHLORIDE 0.9 % IV BOLUS
1000.0000 mL | Freq: Once | INTRAVENOUS | Status: AC
  Administered 2023-12-08: 1000 mL via INTRAVENOUS

## 2023-12-08 MED ORDER — SODIUM CHLORIDE 0.9 % IV SOLN
2.0000 g | Freq: Once | INTRAVENOUS | Status: AC
  Administered 2023-12-08: 2 g via INTRAVENOUS
  Filled 2023-12-08: qty 20

## 2023-12-08 MED ORDER — GABAPENTIN 300 MG PO CAPS
300.0000 mg | ORAL_CAPSULE | Freq: Three times a day (TID) | ORAL | Status: DC
  Administered 2023-12-09 – 2023-12-19 (×22): 300 mg via ORAL
  Filled 2023-12-08 (×27): qty 1

## 2023-12-08 NOTE — ED Notes (Signed)
 [facility] Beth contacted regarding IV team not arriving as of yet. Stating they are in a procedure. MD notified of delay.

## 2023-12-08 NOTE — ED Notes (Signed)
 Multiple attempts by this nurse and other staff to keep monitoring equipment, clothing, blankets and socks on the patient. Patient removes all the above immediately after placement. Alarm intact however socks and band are not able to be maintained r/t pt compliance with care.

## 2023-12-08 NOTE — ED Provider Notes (Addendum)
 [facility] Provider Note    Event Date/Time   First MD Initiated Contact with Patient 12/08/23 1502     (approximate)   History   Altered Mental Status   HPI  Cassandra Weiss is a 89 y.o. female who comes in with concerns for altered mental status and hypotension.  According to EMS patient has been less responsive for the past 2 days.  They had a blood pressure of 80/40 with home health.  Patient does have a chronic Foley  I reviewed a discharge summary from 10/07/2023.  Patient has a history of paroxysmal A-fib on Coumadin , hypertension during this admission she had issues with ambulation found to have acute renal failure secondary to p.o. intake being decreased she is recommended discharge to skilled nursing facility.  She was then seen on 8/8 for new acute urinary retention and was discharged with a Foley catheter.  Did call the husband who denies any known falls.  He reports that she is just not been eating or drinking for the past 2 days.  He denies a complaint of any chest pain, abdominal pain or any other concerns.  They do report that the Foley catheter has not been changed in over a month since it was last placed.    Physical Exam   Triage Vital Signs: ED Triage Vitals [12/08/23 1436]  Encounter Vitals Group     BP 116/64     Girls Systolic BP Percentile      Girls Diastolic BP Percentile      Boys Systolic BP Percentile      Boys Diastolic BP Percentile      Pulse Rate 85     Resp (!) 24     Temp (!) 96.3 F (35.7 C)     Temp Source Axillary     SpO2 98 %     Weight      Height      Head Circumference      Peak Flow      Pain Score      Pain Loc      Pain Education      Exclude from Growth Chart     Most recent vital signs: Vitals:   12/08/23 1436  BP: 116/64  Pulse: 85  Resp: (!) 24  Temp: (!) 96.3 F (35.7 C)  SpO2: 98%     General: Awake, no distress.  CV:  Good peripheral perfusion.  Resp:  Normal effort.  Clear  lungs Abd:  No distention.  Soft and nontender Other:  Alert and oriented x 1.  Able to squeeze her bilateral hands.  Able to wiggle her bilateral toes   ED Results / Procedures / Treatments   Labs (all labs ordered are listed, but only abnormal results are displayed) Labs Reviewed  CBC WITH DIFFERENTIAL/PLATELET - Abnormal; Notable for the following components:      Result Value   RBC 3.49 (*)    Hemoglobin 9.3 (*)    HCT 31.1 (*)    MCHC 29.9 (*)    RDW 24.2 (*)    All other components within normal limits  RESP PANEL BY RT-PCR (RSV, FLU A&B, COVID)  RVPGX2  COMPREHENSIVE METABOLIC PANEL WITH GFR  LIPASE, BLOOD  URINALYSIS, COMPLETE (UACMP) WITH MICROSCOPIC  TSH  PROTIME-INR  TROPONIN I (HIGH SENSITIVITY)     EKG  My interpretation of EKG:  A-fib with a rate of 92 without any ST elevation or T wave inversions, normal intervals  RADIOLOGY I have reviewed the xray personally and interpreted no evidence of any pneumonia.  Does have moderate cardiomegaly but I reviewed a chest x-Cassandra from 7/15 where it was similar and she had CT scan that was negative for PE, effusion, dissection   PROCEDURES:  Critical Care performed: No  .1-3 Lead EKG Interpretation  Performed by: Ernest Ronal BRAVO, MD Authorized by: Ernest Ronal BRAVO, MD     Interpretation: normal     ECG rate:  80   ECG rate assessment: normal     Rhythm: sinus rhythm     Ectopy: none     Conduction: normal   .Critical Care  Performed by: Ernest Ronal BRAVO, MD Authorized by: Ernest Ronal BRAVO, MD   Critical care provider statement:    Critical care time (minutes):  30   Critical care was necessary to treat or prevent imminent or life-threatening deterioration of the following conditions:  Sepsis   Critical care was time spent personally by me on the following activities:  Development of treatment plan with patient or surrogate, discussions with consultants, evaluation of patient's response to treatment, examination of  patient, ordering and review of laboratory studies, ordering and review of radiographic studies, ordering and performing treatments and interventions, pulse oximetry, re-evaluation of patient's condition and review of old charts    MEDICATIONS ORDERED IN ED: Medications  sodium chloride  0.9 % bolus 500 mL (500 mLs Intravenous New Bag/Given 12/08/23 1719)  cefTRIAXone  (ROCEPHIN ) 2 g in sodium chloride  0.9 % 100 mL IVPB (0 g Intravenous Stopped 12/08/23 1821)  sodium chloride  0.9 % bolus 1,000 mL (1,000 mLs Intravenous New Bag/Given 12/08/23 1749)     IMPRESSION / MDM / ASSESSMENT AND PLAN / ED COURSE  I reviewed the triage vital signs and the nursing notes.   Patient's presentation is most consistent with acute presentation with potential threat to life or bodily function.   Patient comes in with weakness due to not eating and drinking according to husband.  Workup was done to evaluate for Electra abnormalities, AKI, UTI given chronic indwelling Foley.  Will test for COVID, flu and get CT imaging to make sure no evidence of intercranial hemorrhage.  INR level is elevated at 8.8. CBC shows hemoglobin of 9.3 Lipase is normal CMP shows elevated sodium of 156 and elevated creatinine of 1.7 COVID/flu were negative   Patient's urine is positive for UTI.  Will cover with ceftriaxone .  Patient is getting fluid resuscitation given her age of starting off with 2 L for full 30 cc/kg resuscitation given slightly hypotensive she meets sepsis with hypothermia and with her elevated respiratory rate.  It her lactate is normal which is reassuring.  Her CT imaging is otherwise negative.  Will discuss with the hospitalist for admission.   7:04 PM repeat blood pressures have improved  patient is no longer hypotensive  The patient is on the cardiac monitor to evaluate for evidence of arrhythmia and/or significant heart rate changes.      FINAL CLINICAL IMPRESSION(S) / ED DIAGNOSES   Final diagnoses:   Altered mental status, unspecified altered mental status type  Weakness  Hypernatremia  AKI (acute kidney injury) (HCC)  Sepsis, due to unspecified organism, unspecified whether acute organ dysfunction present [facility])     Rx / DC Orders   ED Discharge Orders     None        Note:  This document was prepared using Dragon voice recognition software and may include unintentional dictation errors.  Ernest Ronal BRAVO, MD 12/08/23 RETHA    Ernest Ronal BRAVO, MD 12/08/23 [phone]

## 2023-12-08 NOTE — H&P (Signed)
 History and Physical    Patient: Cassandra Weiss FMW:[id number] DOB: 12-08-1932 DOA: 12/08/2023 DOS: the patient was seen and examined on 12/08/2023 PCP: Sadie Manna, MD  Patient coming from: Home  Chief Complaint:  Chief Complaint  Patient presents with   Altered Mental Status   HPI: Cassandra Weiss is a 89 y.o. female with medical history significant of atrial fibrillation, hypertension, urge incontinence, hyperlipidemia coronary artery disease, iron deficiency anemia who was brought to the ER with altered mental status and hypotension.  Patient apparently has been less responsive for about 2 days.  Was also found to be hypotensive at home was blood pressure 88/40.  Patient has chronic indwelling catheter.  Patient is currently unable to give any history and no family at bedside.  She was found to have met sepsis criteria with hypothermia temperature 95.6, blood pressure is 93/42 here she has a sodium of 156 hemoglobin 9.3 chloride 115, BUN is 50 creatinine 1.71.  Patient is on warfarin and INR is 8.8 glucose 104.  Acute viral screen is negative.  Analysis positive for UTI.  Patient also had CT renal stone that showed nonobstructive stone otherwise.  Patient is being admitted for sepsis due to UTI  Review of Systems: As mentioned in the history of present illness. All other systems reviewed and are negative. Past Medical History:  Diagnosis Date   Arthritis    Atrial fibrillation (HCC)    Hypertension    Urge urinary incontinence    No past surgical history on file. Social History:  reports that she has never smoked. She has never used smokeless tobacco. She reports that she does not drink alcohol  and does not use drugs.  Allergies  Allergen Reactions   Penicillins Rash    Has patient had a PCN reaction causing immediate rash, facial/tongue/throat swelling, SOB or lightheadedness with hypotension: Unknown Has patient had a PCN reaction causing severe rash involving mucus membranes or  skin necrosis: Unknown Has patient had a PCN reaction that required hospitalization: Unknown Has patient had a PCN reaction occurring within the last 10 years: Unknown If all of the above answers are NO, then may proceed with Cephalosporin use.     Family History  Problem Relation Age of Onset   Heart disease Mother    Stroke Mother    Heart disease Father     Prior to Admission medications   Medication Sig Start Date End Date Taking? Authorizing Provider  acetaminophen  (TYLENOL ) 325 MG tablet Take 2 tablets (650 mg total) by mouth every 6 (six) hours as needed for mild pain (pain score 1-3) or fever (or Fever >/= 101). 10/05/23  Yes Djan, Drue DASEN, MD  amLODipine  (NORVASC ) 5 MG tablet Take 5 mg by mouth daily. 11/07/23  Yes [provider]  atorvastatin  (LIPITOR) 80 MG tablet Take 80 mg by mouth daily. 12/14/17  Yes [provider]  Calcium  Alginate-Silver  (RESTORE SILVER  DRESSING) 2X2 PADS Apply 1 Application topically daily. 12/06/23  Yes [provider]  carvedilol  (COREG ) 25 MG tablet Take 25 mg by mouth 2 (two) times daily. 01/05/18  Yes [provider]  furosemide (LASIX) 40 MG tablet Take 40 mg by mouth daily. 11/04/23  Yes [provider]  gabapentin  (NEURONTIN ) 300 MG capsule Take by mouth.  1 (one) Capsule Capsule three times a day 01/14/14  Yes [provider]  Multiple Vitamin (MULTI-VITAMIN) tablet Take 1 tablet by mouth daily.   Yes [provider]  warfarin (COUMADIN ) 1 MG tablet  Take 1 mg by mouth daily. 11/17/23  Yes [provider]  ALPRAZolam  (XANAX ) 0.5 MG tablet Take 0.5 mg by mouth at bedtime as needed for sleep. 12/26/17   [provider]  hydrALAZINE  (APRESOLINE ) 50 MG tablet Take 50 mg by mouth 3 (three) times daily.    [provider]  linezolid (ZYVOX) 600 MG tablet Take 600 mg by mouth 2 (two) times daily. Patient not taking: Reported on 12/08/2023 11/07/23   [provider]    Physical Exam: Vitals:   12/08/23 1436 12/08/23 1643 12/08/23 1700  BP: 116/64  (!) 93/42  Pulse: 85  88  Resp: (!) 24  (!) 22  Temp: (!) 96.3 F (35.7 C) (!) 95.6 F (35.3 C)   TempSrc: Axillary Rectal   SpO2: 98%  100%   Constitutional: Chronically ill looking, weak, completely obtunded  Eyes: PERRL, lids and conjunctivae normal ENMT: Mucous membranes are dry. Posterior pharynx clear of any exudate or lesions.Normal dentition.  Neck: normal, supple, no masses, no thyromegaly Respiratory: clear to auscultation bilaterally, no wheezing, no crackles. Normal respiratory effort. No accessory muscle use.  Cardiovascular: Regular rate and rhythm, no murmurs / rubs / gallops. No extremity edema. 2+ pedal pulses. No carotid bruits.  Abdomen: no tenderness, no masses palpated. No hepatosplenomegaly. Bowel sounds positive.  Musculoskeletal: Good range of motion, no joint swelling or tenderness, Skin: no rashes, lesions, ulcers. No induration Neurologic: CN 2-12 grossly intact. Sensation intact, DTR normal. Strength 5/5 in all 4.  Psychiatric: Normal judgment and insight. Alert and oriented x 3. Normal mood  Data Reviewed:  Temp 35.6, blood pressure 93/40, pulse 80 respirate 24, sodium 156 chloride 115 glucose 104 BUN 50 creatinine 1.71, PT 75.4 INR 8 point.  Acute viral screen is negative.  Analysis showed amber urine large leukocyte positive nitrite many bacteria WBC more than 50 chest x-ray showed no acute cardiopulmonary abnormality head CT without contrast negative CT renal also showed trace to small left trace right pleural effusions cardiomegaly and 4 mm nonobstructing left nephrolithiasis chest x-ray showed no acute cardiopulmonary abnormalities  Assessment and Plan:  #1 severe sepsis due to UTI: Patient be admitted for severe sepsis.  She is going to be on IV antibiotics.  Blood cultures urine cultures.  IV fluids and follow lactate.  #2 acute metabolic  encephalopathy: Patient has acute metabolic acidosis most likely due to sepsis.  Patient is confused and agitated.  We will continue with this supportive care.  #3 AKI: Continue IV fluids.  #4 UTI: Continue antibiotics as above  #5 benign essential hypertension: Continue antihypertensives  #6 coronary artery disease: Continue home regimen  #7 hyperlipidemia: Continue statin  #8 hypercoagulable state: Patient on warfarin.  INR is markedly elevated we will hold warfarin.  No bleeding.  If there is any episode of bleeding we will give vitamin K .  #9 paroxysmal atrial fibrillation: Continue rate control.  Holding warfarin due to supratherapeutic INR.    Advance Care Planning:   Code Status: Full Code   Consults: None  Family Communication: No family at bedside  Severity of Illness: The appropriate patient status for this patient is INPATIENT. Inpatient status is judged to be reasonable and necessary in order to provide the required intensity of service to ensure the patient's safety. The patient's presenting symptoms, physical exam findings, and initial radiographic and laboratory data in the context of their chronic comorbidities is felt to place them at high risk for further clinical deterioration. Furthermore, it is not  anticipated that the patient will be medically stable for discharge from the hospital within 2 midnights of admission.   * I certify that at the point of admission it is my clinical judgment that the patient will require inpatient hospital care spanning beyond 2 midnights from the point of admission due to high intensity of service, high risk for further deterioration and high frequency of surveillance required.*  AuthorBETHA SIM KNOLL, MD 12/08/2023 7:08 PM  For on call review [email or web address].

## 2023-12-08 NOTE — ED Notes (Signed)
 Lab called to obtain pts blood work, spoke with [employer]

## 2023-12-08 NOTE — ED Notes (Signed)
 IV access attempted by this RN x 1 unsuccessfully

## 2023-12-08 NOTE — ED Notes (Signed)
 ED Provider at bedside.

## 2023-12-08 NOTE — ED Notes (Signed)
 Patient to CT.

## 2023-12-08 NOTE — ED Notes (Signed)
 Warm blankets placed.

## 2023-12-08 NOTE — ED Notes (Signed)
 Fall risk bundle is currently in place.

## 2023-12-08 NOTE — ED Notes (Signed)
 Pt cleaned from small BM. Clean brief and chucks placed.

## 2023-12-08 NOTE — ED Notes (Signed)
 Warm blankets placed. Pt resting.

## 2023-12-08 NOTE — ED Notes (Signed)
IV team arrival

## 2023-12-08 NOTE — ED Triage Notes (Signed)
 Pt BIB [facility] for AMS and hypotension. Per ems patient less responsive over 2 days. Pt has home health and reported hypotension 88/40 at home. Patient confused and unknown baseline. Patient has chronic foley. Unknown history.

## 2023-12-08 NOTE — ED Notes (Signed)
Fall bundle in place

## 2023-12-08 NOTE — ED Notes (Signed)
 Patient removed PIV. Site hemostatic.

## 2023-12-08 NOTE — ED Notes (Signed)
 Mittens placed.

## 2023-12-09 DIAGNOSIS — I10 Essential (primary) hypertension: Secondary | ICD-10-CM

## 2023-12-09 DIAGNOSIS — Z7189 Other specified counseling: Secondary | ICD-10-CM | POA: Diagnosis not present

## 2023-12-09 DIAGNOSIS — N189 Chronic kidney disease, unspecified: Secondary | ICD-10-CM

## 2023-12-09 DIAGNOSIS — G9341 Metabolic encephalopathy: Secondary | ICD-10-CM

## 2023-12-09 DIAGNOSIS — E870 Hyperosmolality and hypernatremia: Secondary | ICD-10-CM

## 2023-12-09 DIAGNOSIS — N390 Urinary tract infection, site not specified: Secondary | ICD-10-CM | POA: Diagnosis not present

## 2023-12-09 DIAGNOSIS — E785 Hyperlipidemia, unspecified: Secondary | ICD-10-CM

## 2023-12-09 DIAGNOSIS — B9561 Methicillin susceptible Staphylococcus aureus infection as the cause of diseases classified elsewhere: Secondary | ICD-10-CM

## 2023-12-09 DIAGNOSIS — N179 Acute kidney failure, unspecified: Secondary | ICD-10-CM | POA: Diagnosis not present

## 2023-12-09 DIAGNOSIS — D509 Iron deficiency anemia, unspecified: Secondary | ICD-10-CM

## 2023-12-09 DIAGNOSIS — D6859 Other primary thrombophilia: Secondary | ICD-10-CM

## 2023-12-09 DIAGNOSIS — A419 Sepsis, unspecified organism: Secondary | ICD-10-CM | POA: Diagnosis not present

## 2023-12-09 DIAGNOSIS — R7881 Bacteremia: Secondary | ICD-10-CM

## 2023-12-09 DIAGNOSIS — N3941 Urge incontinence: Secondary | ICD-10-CM

## 2023-12-09 DIAGNOSIS — I480 Paroxysmal atrial fibrillation: Secondary | ICD-10-CM

## 2023-12-09 DIAGNOSIS — I959 Hypotension, unspecified: Secondary | ICD-10-CM

## 2023-12-09 DIAGNOSIS — E860 Dehydration: Secondary | ICD-10-CM

## 2023-12-09 DIAGNOSIS — T148XXA Other injury of unspecified body region, initial encounter: Secondary | ICD-10-CM

## 2023-12-09 DIAGNOSIS — L89159 Pressure ulcer of sacral region, unspecified stage: Secondary | ICD-10-CM

## 2023-12-09 LAB — COMPREHENSIVE METABOLIC PANEL WITH GFR
ALT: 13 U/L (ref 0–44)
AST: 21 U/L (ref 15–41)
Albumin: 2.6 g/dL — ABNORMAL LOW (ref 3.5–5.0)
Alkaline Phosphatase: 49 U/L (ref 38–126)
Anion gap: 10 (ref 5–15)
BUN: 43 mg/dL — ABNORMAL HIGH (ref 8–23)
CO2: 25 mmol/L (ref 22–32)
Calcium: 8.9 mg/dL (ref 8.9–10.3)
Chloride: 120 mmol/L — ABNORMAL HIGH (ref 98–111)
Creatinine, Ser: 1.4 mg/dL — ABNORMAL HIGH (ref 0.44–1.00)
GFR, Estimated: 36 mL/min — ABNORMAL LOW (ref >60)
Glucose, Bld: 99 mg/dL (ref 70–99)
Potassium: 3.9 mmol/L (ref 3.5–5.1)
Sodium: 155 mmol/L — ABNORMAL HIGH (ref 135–145)
Total Bilirubin: 1 mg/dL (ref 0.0–1.2)
Total Protein: 6.2 g/dL — ABNORMAL LOW (ref 6.5–8.1)

## 2023-12-09 LAB — BLOOD CULTURE ID PANEL (REFLEXED) - BCID2

## 2023-12-09 LAB — CBC
HCT: 27 % — ABNORMAL LOW (ref 36.0–46.0)
Hemoglobin: 7.9 g/dL — ABNORMAL LOW (ref 12.0–15.0)
MCH: 26.3 pg (ref 26.0–34.0)
MCHC: 29.3 g/dL — ABNORMAL LOW (ref 30.0–36.0)
MCV: 90 fL (ref 80.0–100.0)
Platelets: 296 K/uL (ref 150–400)
RBC: 3 MIL/uL — ABNORMAL LOW (ref 3.87–5.11)
RDW: 24.2 % — ABNORMAL HIGH (ref 11.5–15.5)
WBC: 8.8 K/uL (ref 4.0–10.5)
nRBC: 0 % (ref 0.0–0.2)

## 2023-12-09 LAB — IRON AND TIBC
Iron: 27 ug/dL — ABNORMAL LOW (ref 28–170)
Saturation Ratios: 17 % (ref 10.4–31.8)
TIBC: 155 ug/dL — ABNORMAL LOW (ref 250–450)
UIBC: 128 ug/dL

## 2023-12-09 LAB — FOLATE: Folate: 14.7 ng/mL (ref >5.9)

## 2023-12-09 LAB — CORTISOL-AM, BLOOD: Cortisol - AM: 11.8 ug/dL (ref 6.7–22.6)

## 2023-12-09 LAB — RETICULOCYTES
Immature Retic Fract: 26.8 % — ABNORMAL HIGH (ref 2.3–15.9)
RBC.: 2.98 MIL/uL — ABNORMAL LOW (ref 3.87–5.11)
Retic Count, Absolute: 101 K/uL (ref 19.0–186.0)
Retic Ct Pct: 3.4 % — ABNORMAL HIGH (ref 0.4–3.1)

## 2023-12-09 LAB — PROTIME-INR
INR: 10.1 (ref 0.8–1.2)
Prothrombin Time: 83.9 s — ABNORMAL HIGH (ref 11.4–15.2)

## 2023-12-09 LAB — FERRITIN: Ferritin: 147 ng/mL (ref 11–307)

## 2023-12-09 MED ORDER — CEFAZOLIN SODIUM-DEXTROSE 2-4 GM/100ML-% IV SOLN
2.0000 g | Freq: Three times a day (TID) | INTRAVENOUS | Status: DC
  Administered 2023-12-09 – 2023-12-15 (×17): 2 g via INTRAVENOUS
  Filled 2023-12-09 (×20): qty 100

## 2023-12-09 MED ORDER — LEVOFLOXACIN IN D5W 500 MG/100ML IV SOLN
500.0000 mg | INTRAVENOUS | Status: DC
  Administered 2023-12-09 – 2023-12-10 (×2): 500 mg via INTRAVENOUS
  Filled 2023-12-09 (×2): qty 100

## 2023-12-09 MED ORDER — CHLORHEXIDINE GLUCONATE CLOTH 2 % EX PADS
6.0000 | MEDICATED_PAD | Freq: Every day | CUTANEOUS | Status: DC
Start: 2023-12-09 — End: 2023-12-20
  Administered 2023-12-09 – 2023-12-20 (×12): 6 via TOPICAL

## 2023-12-09 MED ORDER — WARFARIN - PHARMACIST DOSING INPATIENT
Freq: Every day | Status: DC
  Filled 2023-12-09: qty 1

## 2023-12-09 MED ORDER — DEXTROSE 5 % IV SOLN: INTRAVENOUS | Status: AC

## 2023-12-09 MED ORDER — VITAMIN K1 10 MG/ML IJ SOLN
5.0000 mg | Freq: Once | INTRAVENOUS | Status: AC
  Administered 2023-12-09: 5 mg via INTRAVENOUS
  Filled 2023-12-09: qty 0.5

## 2023-12-09 NOTE — Progress Notes (Signed)
 Progress Note   Patient: Cassandra Weiss FMW:[id number] DOB: 01-30-1933 DOA: 12/08/2023     1 DOS: the patient was seen and examined on 12/09/2023   Brief hospital course: Partly taken from H&P.  KYMIAH ARAIZA is a 89 y.o. female with medical history significant of atrial fibrillation, hypertension, urge incontinence, hyperlipidemia coronary artery disease, iron deficiency anemia who was brought to the ER with altered mental status and hypotension.  Patient apparently has been less responsive for about 2 days.  Patient has chronic indwelling catheter.   On presentation patient was hypothermic at 95.6, borderline soft blood pressure, sodium of 156, hemoglobin 9.3, chloride 115, BUN 50, creatinine 1.71, INR of 8.8 on warfarin, respiratory panel negative.  UA concerning for UTI.  CT renal stone shows nonobstructing stone, otherwise unremarkable. CT head was negative for any acute abnormality. CXR with no acute cardiopulmonary abnormalities.  Patient was admitted for concern of sepsis secondary to UTI and started on ceftriaxone .  Blood cultures were drawn.   9/19: Vitals stable, hemoglobin decreased to 7.9 and INR increased to 10.1, no obvious bleeding, giving vitamin K .  Sodium at 155, creatinine with some improvement to 1.40-baseline seems to be around 1.1 Ordered urine culture as add-on.  Preliminary blood culture 1 out of 4 bottles with MSSA. Switching IV fluid to D5 with water deficit of 2.9L. Patient with significant underlying comorbidities, advanced age and poor functional status-consulting palliative care to discuss goals of care.  At baseline patient just walked with the help of walker from bed to her recliner, completely dependent for all ADLs.  Son manages the medications but patient does not take them regularly.  Lives with husband of the similar age, had a caregiver during the day.  Assessment and Plan: * Sepsis secondary to UTI (HCC) MSSA bacteremia. UA concerning for UTI, urine  cultures were ordered as add-on today.  Preliminary blood cultures with 1/4 bottles with MSSA. Hypothermia has been improved. - Continue with ceftriaxone  -ID was consulted -Continue supportive care  AKI (acute kidney injury) (HCC) Likely secondary to severe dehydration and poor p.o. intake, creatinine with some improvement to 1.4 today, still above baseline of about 1.1 -Continue with IV fluid -Monitor renal function -Avoid nephrotoxins  Hypernatremia Sodium of 155 today, water deficit of 2.9 L. Patient is severely dehydrated with poor p.o. intake. -Switching IV fluid with D5 -monitor sodium  Acute metabolic encephalopathy Patient is currently oriented to name only and not been able to participate with any meaningful history or care. Concern of underlying advanced dementia. - Delirium precautions.  Benign essential HTN Blood pressure borderline soft. - Holding home antihypertensives -Monitor blood pressure  Paroxysmal A-fib (HCC) EKG with A-fib, rate seems controlled. - Continuing carvedilol  -Holding Coumadin  due to supra therapeutic INR  Hypercoagulable state (HCC) Worsening INR, currently at 10.1 this morning. No obvious bleeding but hemoglobin dropped to 7.9. - Giving some vitamin K  -Monitor INR  Coronary artery disease Unable to explain any chest pain, barely positive troponin with a flat curve. - Continuing home carvedilol  and statin  Iron deficiency anemia Iron deficiency anemia mentioned in her chart, hemoglobin 7.9 today with supratherapeutic INR, no obvious bleeding. - Check anemia panel -Monitor hemoglobin -Transfuse if below 7  Hyperlipidemia - Continue home statin  Urge urinary incontinence Patient had a chronic Foley catheter in place with history of urge urinary incontinence. Foley was exchanged in the ED. - Continue with Foley catheter care  Subjective: Patient was seen and examined today.  She does moan  and call her name.  Unable to provide any  other meaningful history, following some commands.  Physical Exam: Vitals:   12/09/23 0600 12/09/23 0700 12/09/23 0715 12/09/23 1000  BP: (!) 144/59 (!) 122/59  101/72  Pulse: 80 91 87 80  Resp: 10  16 15   Temp:    97.6 F (36.4 C)  TempSrc:    Axillary  SpO2: 100%  97% 100%   General.  Frail and severely malnourished elderly lady, in no acute distress. Pulmonary.  Lungs clear bilaterally, normal respiratory effort. CV.  Regular rate and rhythm, no JVD, rub or murmur. Abdomen.  Soft, nontender, nondistended, BS positive. CNS.  Lethargic, following just few simple commands Extremities.  No edema,  pulses intact and symmetrical. Psychiatry.  Judgment and insight appears impaired.  Data Reviewed: Prior data reviewed.  Family Communication: Discussed with son on phone  Disposition: Status is: Inpatient Remains inpatient appropriate because: Severity of illness  Planned Discharge Destination: To be determined  DVT prophylaxis.  Coumadin -currently on hold due to supratherapeutic INR Time spent: 55 minutes  This record has been created using Conservation officer, historic buildings. Errors have been sought and corrected,but may not always be located. Such creation errors do not reflect on the standard of care.   Author: Amaryllis Dare, MD 12/09/2023 1:30 PM  For on call review [email or web address].

## 2023-12-09 NOTE — Assessment & Plan Note (Signed)
 Patient is currently oriented to name only and not been able to participate with any meaningful history or care. Concern of underlying advanced dementia. - Delirium precautions.

## 2023-12-09 NOTE — Consult Note (Signed)
 Consultation Note Date: 12/09/2023   Patient Name: Cassandra Weiss  DOB: [birth year]  MRN: [id number]  Age / Sex: 89 y.o., female  PCP: Sadie Manna, MD Referring Physician: Caleen Qualia, MD  Reason for Consultation: Establishing goals of care  HPI/Patient Profile: Per H&P : Cassandra Weiss is a 89 y.o. female with medical history significant of atrial fibrillation, hypertension, urge incontinence, hyperlipidemia coronary artery disease, iron deficiency anemia who was brought to the ER with altered mental status and hypotension.  Patient apparently has been less responsive for about 2 days.  Was also found to be hypotensive at home was blood pressure 88/40.  Patient has chronic indwelling catheter.  Patient is currently unable to give any history and no family at bedside.  She was found to have met sepsis criteria with hypothermia temperature 95.6, blood pressure is 93/42 here she has a sodium of 156 hemoglobin 9.3 chloride 115, BUN is 50 creatinine 1.71.  Patient is on warfarin and INR is 8.8 glucose 104.  Acute viral screen is negative.  Analysis positive for UTI.  Patient also had CT renal stone that showed nonobstructive stone otherwise.  Patient is being admitted for sepsis due to UTI .  Clinical Assessment and Goals of Care: Notes and labs reviewed.  In to see patient.  She is currently resting in bed with a mitten in place.  Her son and his cousin are at bedside.  He discusses that patient lives at home with her husband, but her husband is unable to physically assist her.  He states there is a paid caregiver who comes into the home 3 times a day every day to help feed her, dress her, and get her out of bed into her chair.  He discusses that she has a Foley catheter as well as depends.    We discussed her diagnoses, prognosis, GOC, EOL wishes disposition and options.  Created space and opportunity for  patient  to explore thoughts and feelings regarding current medical information.   A detailed discussion was had today regarding advanced directives.  Concepts specific to code status, IV antibiotics and rehospitalization were discussed.  The difference between an aggressive medical intervention path and a comfort care path was discussed.  Values and goals of care important to patient and family were attempted to be elicited.  Discussed limitations of medical interventions to prolong quality of life in some situations and discussed the concept of human mortality.  Patient's son is a Optician, dispensing, and patient and family are a family of faith.  We discussed God's will and sovereignty.  We discussed determining acceptable quality of life.  We discussed pain and suffering.  Son states he will consider all this to help make decisions moving forward.  Discussed that PMT will continue to follow.         SUMMARY OF RECOMMENDATIONS   PMT will follow      Primary Diagnoses: Present on Admission:  Altered mental status  AKI (acute kidney injury) (HCC)  Acute urinary tract infection  Benign essential HTN  Coronary artery disease  Paroxysmal A-fib (HCC)  AMS (altered mental status)  Iron deficiency anemia  Sepsis secondary to UTI (HCC)  Hypernatremia  Hyperlipidemia   I have reviewed the medical record, interviewed the patient and family, and examined the patient. The following aspects are pertinent.  Past Medical History:  Diagnosis Date   Arthritis    Atrial fibrillation (HCC)    Hypertension    Urge urinary incontinence    Social History   Socioeconomic History   Marital status: Married    Spouse name: Not on file   Number of children: Not on file   Years of education: Not on file   Highest education level: Not on file  Occupational History   Not on file  Tobacco Use   Smoking status: Never   Smokeless tobacco: Never  Vaping Use   Vaping status: Never Used  Substance and  Sexual Activity   Alcohol  use: No   Drug use: Never   Sexual activity: Not on file  Other Topics Concern   Not on file  Social History Narrative   Lives with husband at home   Social Drivers of Health   Financial Resource Strain: Low Risk  (08/17/2023)   Received from [facility]   Overall Financial Resource Strain (CARDIA)    Difficulty of Paying Living Expenses: Not hard at all  Food Insecurity: No Food Insecurity (10/04/2023)   Hunger Vital Sign    Worried About Running Out of Food in the Last Year: Never true    Ran Out of Food in the Last Year: Never true  Transportation Needs: No Transportation Needs (10/04/2023)   PRAPARE - Administrator, Civil Service (Medical): No    Lack of Transportation (Non-Medical): No  Physical Activity: Insufficiently Active (01/13/2018)   Exercise Vital Sign    Days of Exercise per Week: 2 days    Minutes of Exercise per Session: 10 min  Stress: No Stress Concern Present (01/13/2018)   [employer] of Occupational Health - Occupational Stress Questionnaire    Feeling of Stress : Not at all  Social Connections: Moderately Integrated (10/04/2023)   Social Connection and Isolation Panel    Frequency of Communication with Friends and Family: Once a week    Frequency of Social Gatherings with Friends and Family: Once a week    Attends Religious Services: More than 4 times per year    Active Member of [employer] or Organizations: Yes    Attends Engineer, structural: More than 4 times per year    Marital Status: Married   Family History  Problem Relation Age of Onset   Heart disease Mother    Stroke Mother    Heart disease Father    Scheduled Meds:  atorvastatin   80 mg Oral Daily   carvedilol   25 mg Oral BID   gabapentin   300 mg Oral TID   multivitamin with minerals  1 tablet Oral Daily   [START ON 12/10/2023] Warfarin - Pharmacist Dosing Inpatient   Does not apply q1600   Continuous Infusions:    ceFAZolin  (ANCEF ) IV     dextrose  100 mL/hr at 12/09/23 1004   PRN Meds:.acetaminophen  **OR** acetaminophen , ALPRAZolam , haloperidol  lactate, ondansetron  **OR** ondansetron  (ZOFRAN ) IV Medications Prior to Admission:  Prior to Admission medications   Medication Sig Start Date End Date Taking? Authorizing Provider  acetaminophen  (TYLENOL ) 325 MG tablet Take 2 tablets (650 mg total) by mouth every 6 (six) hours  as needed for mild pain (pain score 1-3) or fever (or Fever >/= 101). 10/05/23  Yes Djan, Drue DASEN, MD  amLODipine  (NORVASC ) 5 MG tablet Take 5 mg by mouth daily. 11/07/23  Yes [provider]  atorvastatin  (LIPITOR) 80 MG tablet Take 80 mg by mouth daily. 12/14/17  Yes [provider]  Calcium  Alginate-Silver  (RESTORE SILVER  DRESSING) 2X2 PADS Apply 1 Application topically daily. 12/06/23  Yes [provider]  carvedilol  (COREG ) 25 MG tablet Take 25 mg by mouth 2 (two) times daily. 01/05/18  Yes [provider]  furosemide (LASIX) 40 MG tablet Take 40 mg by mouth daily. 11/04/23  Yes [provider]  gabapentin  (NEURONTIN ) 300 MG capsule Take by mouth.  1 (one) Capsule Capsule three times a day 01/14/14  Yes [provider]  Multiple Vitamin (MULTI-VITAMIN) tablet Take 1 tablet by mouth daily.   Yes [provider]  warfarin (COUMADIN ) 1 MG tablet Take 1 mg by mouth daily. 11/17/23  Yes [provider]  ALPRAZolam  (XANAX ) 0.5 MG tablet Take 0.5 mg by mouth at bedtime as needed for sleep. 12/26/17   [provider]  hydrALAZINE  (APRESOLINE ) 50 MG tablet Take 50 mg by mouth 3 (three) times daily.    [provider]  linezolid (ZYVOX) 600 MG tablet Take 600 mg by mouth 2 (two) times daily. Patient not taking: Reported on 12/08/2023 11/07/23   [provider]   Allergies  Allergen Reactions   Penicillins Rash    Has patient had a PCN reaction causing immediate rash, facial/tongue/throat swelling, SOB  or lightheadedness with hypotension: Unknown Has patient had a PCN reaction causing severe rash involving mucus membranes or skin necrosis: Unknown Has patient had a PCN reaction that required hospitalization: Unknown Has patient had a PCN reaction occurring within the last 10 years: Unknown If all of the above answers are NO, then may proceed with Cephalosporin use.    Review of Systems  Unable to perform ROS   Physical Exam Constitutional:      Comments: Opens eyes intermittently during visit.  She smiles.  Unable to participate in goals of care conversation.  Pulmonary:     Effort: Pulmonary effort is normal.     Vital Signs: BP 135/88   Pulse 84   Temp 98 F (36.7 C) (Axillary)   Resp 17   SpO2 100%          SpO2: SpO2: 100 % O2 Device:SpO2: 100 % O2 Flow Rate: .   IO: Intake/output summary:  Intake/Output Summary (Last 24 hours) at 12/09/2023 1638 Last data filed at 12/09/2023 9166 Gross per 24 hour  Intake 1603.88 ml  Output --  Net 1603.88 ml    LBM:   Baseline Weight:   Most recent weight:         Signed by: Camelia Lewis, NP   Please contact Palliative Medicine Team phone at [phone] for questions and concerns.  For individual provider: See Tracey

## 2023-12-09 NOTE — Assessment & Plan Note (Signed)
 Iron deficiency anemia mentioned in her chart, hemoglobin 7.9 today with supratherapeutic INR, no obvious bleeding. - Check anemia panel -Monitor hemoglobin -Transfuse if below 7

## 2023-12-09 NOTE — Assessment & Plan Note (Signed)
 Unable to explain any chest pain, barely positive troponin with a flat curve. - Continuing home carvedilol  and statin

## 2023-12-09 NOTE — Assessment & Plan Note (Signed)
 Blood pressure borderline soft. - Holding home antihypertensives -Monitor blood pressure

## 2023-12-09 NOTE — ED Notes (Signed)
 Family to desk asking about a foul odor coming from her mouth and maybe that is why she is not eating. Family was educated on oral hygiene in the elderly and he could speak with MD about same but I had placed a speech eval to see if that may help as well.

## 2023-12-09 NOTE — Assessment & Plan Note (Signed)
 Worsening INR, currently at 10.1 this morning. No obvious bleeding but hemoglobin dropped to 7.9. - Giving some vitamin K  -Monitor INR

## 2023-12-09 NOTE — Hospital Course (Addendum)
 Partly taken from H&P.  Cassandra Weiss is a 89 y.o. female with medical history significant of atrial fibrillation, hypertension, urge incontinence, hyperlipidemia coronary artery disease, iron deficiency anemia who was brought to the ER with altered mental status and hypotension.  Patient apparently has been less responsive for about 2 days.  Patient has chronic indwelling catheter.   On presentation patient was hypothermic at 95.6, borderline soft blood pressure, sodium of 156, hemoglobin 9.3, chloride 115, BUN 50, creatinine 1.71, INR of 8.8 on warfarin, respiratory panel negative.  UA concerning for UTI.  CT renal stone shows nonobstructing stone, otherwise unremarkable. CT head was negative for any acute abnormality. CXR with no acute cardiopulmonary abnormalities.  Patient was admitted for concern of sepsis secondary to UTI and started on ceftriaxone .  Blood cultures were drawn.   9/19: Vitals stable, hemoglobin decreased to 7.9 and INR increased to 10.1, no obvious bleeding, giving vitamin K .  Sodium at 155, creatinine with some improvement to 1.40-baseline seems to be around 1.1 Ordered urine culture as add-on.  Preliminary blood culture 1 out of 4 bottles with MSSA. Switching IV fluid to D5 with water deficit of 2.9L. Patient with significant underlying comorbidities, advanced age and poor functional status-consulting palliative care to discuss goals of care.  At baseline patient just walked with the help of walker from bed to her recliner, completely dependent for all ADLs.  Son manages the medications but patient does not take them regularly.  Lives with husband of the similar age, had a caregiver during the day.  9/20: Remained quite lethargic and somnolent, CODE STATUS changed to DNR after palliative care meeting but family wants maximum medical intervention.  Right shoulder imaging was obtained due to concern of recent concern of joint effusion in July with severe osteoarthritis.  MSSA  bacteremia can be due to few smaller skin lesions and injuries, if there is a joint effusion we will ask Ortho to tap to rule out any septic arthritis. Urine cultures growing E. Coli.  INR improved to 1.7, hemoglobin decreased to 7.2, no active bleeding-did 1 unit of PRBC.  Replacing potassium, sodium improved to 148 with improving renal function-continuing D5 with potassium for another day.  9/21: Vital stable, hypernatremia improved, renal function seems stable at 1.17, p.o. intake remained poor, switching IV fluid to LR as maintenance fluid.  Urine cultures with pansensitive E. coli so Levaquin  is being discontinued.  Patient will continue on cefazolin .  Repeat blood cultures pending. PT and OT evaluation ordered.  9/22: Hemodynamically remained stable, repeat blood cultures negative in 24 hours.  Echocardiogram with normal EF, asymmetric left LV hypertrophy, there was an echogenicity noted on aortic valve which could be due to vegetation versus degenerative changes with recommendations to do TEE for further evaluation. [facility] cardiology consulted for TEE. PT recommending SNF  9/23: Remained stable, repeat blood cultures remain negative, TEE is planned for later today. INR of 3.5

## 2023-12-09 NOTE — Assessment & Plan Note (Signed)
 Sodium of 155 today, water deficit of 2.9 L. Patient is severely dehydrated with poor p.o. intake. -Switching IV fluid with D5 -monitor sodium

## 2023-12-09 NOTE — Consult Note (Signed)
 NAME: Cassandra Weiss  DOB: 08/12/1932  MRN: [id number]  Date/Time: 12/09/2023 1:32 PM  REQUESTING PROVIDER: Dr. Caleen Subjective:  REASON FOR CONSULT: MSSA bacteremia ?pt is a limited historian- chart reviewed -spoke to son at bed side Cassandra Weiss is a 89 y.o. female with a history of Afib, HTNm HLD, CAD, IDA, on coumadin   foley for the past 30 days presented to the ED with altered mental status. As per EMS note the home health care taker noted a BP of 70/30 and called EMS- also the patient was not her usual self the past  2 days- was very lethargic and altered mental status- At baseline she can take a few steps with a walker. she was recently in the hospital between 7/15-7/18 after a fall and at that hospitalization she had severe OA of rt shoulder and MRI showed joint effusion and asked to follow up with ortho as OP. She was sent ot SNF- she came to the ED on 8/8 from SNF with urinary retention and had 600cc of urine and had foley placed in the ED and she was sent back to SNF Pt brought in from home this time on 9/18/5 Vitals in the ED  12/08/23 14:36  BP 116/64  Temp 96.3 F (35.7 C) !  Pulse Rate 85  Resp 24 !  SpO2 98 %    Latest Reference Range & Units 12/08/23 15:15  WBC 4.0 - 10.5 K/uL 8.5  Hemoglobin 12.0 - 15.0 g/dL 9.3 (L)  HCT 63.9 - 53.9 % 31.1 (L)  Platelets 150 - 400 K/uL 334  Creatinine 0.44 - 1.00 mg/dL 8.28 (H)  Na 843 Cl 884 Cr 1.71 BUN 50 CT head no acute findings Blood culture sent- she was started on IV ceftriaxone  for UTI INR was 10= she received a dose of vitamin K  CT renal study showed b/l kidney proteinaceous or hemorrhagic cysts I am seeing the patient for MSSA bacteremia  Past Medical History:  Diagnosis Date   Arthritis    Atrial fibrillation (HCC)    Hypertension    Urge urinary incontinence     No past surgical history on file.  Social History   Socioeconomic History   Marital status: Married    Spouse name: Not on file   Number of  children: Not on file   Years of education: Not on file   Highest education level: Not on file  Occupational History   Not on file  Tobacco Use   Smoking status: Never   Smokeless tobacco: Never  Vaping Use   Vaping status: Never Used  Substance and Sexual Activity   Alcohol  use: No   Drug use: Never   Sexual activity: Not on file  Other Topics Concern   Not on file  Social History Narrative   Lives with husband at home   Social Drivers of Health   Financial Resource Strain: Low Risk  (08/17/2023)   Received from [facility]   Overall Financial Resource Strain (CARDIA)    Difficulty of Paying Living Expenses: Not hard at all  Food Insecurity: No Food Insecurity (10/04/2023)   Hunger Vital Sign    Worried About Running Out of Food in the Last Year: Never true    Ran Out of Food in the Last Year: Never true  Transportation Needs: No Transportation Needs (10/04/2023)   PRAPARE - Administrator, Civil Service (Medical): No    Lack of Transportation (Non-Medical): No  Physical Activity: Insufficiently  Active (01/13/2018)   Exercise Vital Sign    Days of Exercise per Week: 2 days    Minutes of Exercise per Session: 10 min  Stress: No Stress Concern Present (01/13/2018)   [employer] of Occupational Health - Occupational Stress Questionnaire    Feeling of Stress : Not at all  Social Connections: Moderately Integrated (10/04/2023)   Social Connection and Isolation Panel    Frequency of Communication with Friends and Family: Once a week    Frequency of Social Gatherings with Friends and Family: Once a week    Attends Religious Services: More than 4 times per year    Active Member of [employer] or Organizations: Yes    Attends Engineer, structural: More than 4 times per year    Marital Status: Married  Catering manager Violence: Not At Risk (10/04/2023)   Humiliation, Afraid, Rape, and Kick questionnaire    Fear of Current or Ex-Partner: No     Emotionally Abused: No    Physically Abused: No    Sexually Abused: No    Family History  Problem Relation Age of Onset   Heart disease Mother    Stroke Mother    Heart disease Father    Allergies  Allergen Reactions   Penicillins Rash    Has patient had a PCN reaction causing immediate rash, facial/tongue/throat swelling, SOB or lightheadedness with hypotension: Unknown Has patient had a PCN reaction causing severe rash involving mucus membranes or skin necrosis: Unknown Has patient had a PCN reaction that required hospitalization: Unknown Has patient had a PCN reaction occurring within the last 10 years: Unknown If all of the above answers are NO, then may proceed with Cephalosporin use.    I? Current Facility-Administered Medications  Medication Dose Route Frequency Provider Last Rate Last Admin   acetaminophen  (TYLENOL ) tablet 650 mg  650 mg Oral Q6H PRN Sim Emery CROME, MD       Or   acetaminophen  (TYLENOL ) suppository 650 mg  650 mg Rectal Q6H PRN Sim Emery CROME, MD       ALPRAZolam  (XANAX ) tablet 0.5 mg  0.5 mg Oral QHS PRN Sim Emery CROME, MD       atorvastatin  (LIPITOR) tablet 80 mg  80 mg Oral Daily Garba, Mohammad L, MD       carvedilol  (COREG ) tablet 25 mg  25 mg Oral BID Garba, Mohammad L, MD       cefTRIAXone  (ROCEPHIN ) 2 g in sodium chloride  0.9 % 100 mL IVPB  2 g Intravenous Q24H Sim Emery CROME, MD       dextrose  5 % solution   Intravenous Continuous Caleen Qualia, MD 100 mL/hr at 12/09/23 1004 New Bag at 12/09/23 1004   gabapentin  (NEURONTIN ) capsule 300 mg  300 mg Oral TID Sim Emery CROME, MD       haloperidol  lactate (HALDOL ) injection 3 mg  3 mg Intravenous Q6H PRN Garba, Mohammad L, MD   3 mg at 12/08/23 2127   multivitamin with minerals tablet 1 tablet  1 tablet Oral Daily Sim Emery L, MD       ondansetron  (ZOFRAN ) tablet 4 mg  4 mg Oral Q6H PRN Sim Emery CROME, MD       Or   ondansetron  (ZOFRAN ) injection 4 mg  4 mg Intravenous Q6H PRN  Sim Emery CROME, MD       [START ON 12/10/2023] Warfarin - Pharmacist Dosing Inpatient   Does not apply q1600 Niels Kayla FALCON, [facility]  Current Outpatient Medications  Medication Sig Dispense Refill   acetaminophen  (TYLENOL ) 325 MG tablet Take 2 tablets (650 mg total) by mouth every 6 (six) hours as needed for mild pain (pain score 1-3) or fever (or Fever >/= 101). 20 tablet 0   amLODipine  (NORVASC ) 5 MG tablet Take 5 mg by mouth daily.     atorvastatin  (LIPITOR) 80 MG tablet Take 80 mg by mouth daily.     Calcium  Alginate-Silver  (RESTORE SILVER  DRESSING) 2X2 PADS Apply 1 Application topically daily.     carvedilol  (COREG ) 25 MG tablet Take 25 mg by mouth 2 (two) times daily.     furosemide (LASIX) 40 MG tablet Take 40 mg by mouth daily.     gabapentin  (NEURONTIN ) 300 MG capsule Take by mouth.  1 (one) Capsule Capsule three times a day     Multiple Vitamin (MULTI-VITAMIN) tablet Take 1 tablet by mouth daily.     warfarin (COUMADIN ) 1 MG tablet Take 1 mg by mouth daily.     ALPRAZolam  (XANAX ) 0.5 MG tablet Take 0.5 mg by mouth at bedtime as needed for sleep.     hydrALAZINE  (APRESOLINE ) 50 MG tablet Take 50 mg by mouth 3 (three) times daily.     linezolid (ZYVOX) 600 MG tablet Take 600 mg by mouth 2 (two) times daily. (Patient not taking: Reported on 12/08/2023)       Abtx:  Anti-infectives (From admission, onward)    Start     Dose/Rate Route Frequency Ordered Stop   12/09/23 1800  cefTRIAXone  (ROCEPHIN ) 2 g in sodium chloride  0.9 % 100 mL IVPB        2 g 200 mL/hr over 30 Minutes Intravenous Every 24 hours 12/08/23 1907 12/16/23 1759   12/08/23 1745  cefTRIAXone  (ROCEPHIN ) 2 g in sodium chloride  0.9 % 100 mL IVPB        2 g 200 mL/hr over 30 Minutes Intravenous Once 12/08/23 1730 12/08/23 1821       REVIEW OF SYSTEMS: pt is confused and unable to give a reliable review of systems Son says has not been eating ir drinking well in 2 days Has foley   Objective:  VITALS:   BP 101/72   Pulse 80   Temp 97.6 F (36.4 C) (Axillary)   Resp 15   SpO2 100%   PHYSICAL EXAM:  General: Awake, verbal response to questions- oriented in person, knows her date of birth, was able to name her son and her niece at bed side Not able to say why she is in the hospital Mittens on her hands Head: Normocephalic, without obvious abnormality, atraumatic. Eyes: Conjunctivae clear, anicteric sclerae. Pupils are equal ENT Nares normal. No drainage or sinus tenderness. Lips, mucosa, and tongue normal. No Thrush Porr dentition Neck: Supple, symmetrical, no adenopathy, thyroid : non tender no carotid bruit and no JVD. Back: sacrum stage 2 small lesions Lungs: b/l air entry Heart: irregular Abdomen: Soft, non-tender,not distended. Bowel sounds normal. No masses Extremities:left heel eschar 2nd toe- ischemic area  Skinas above Lymph: Cervical, supraclavicular normal. Neurologic: moves all limbs Unable to assess rt shoulder mobility Minimal swelling  Pertinent Labs Lab Results CBC    Component Value Date/Time   WBC 8.8 12/09/2023 0520   RBC 3.00 (L) 12/09/2023 0520   RBC 2.98 (L) 12/09/2023 0520   HGB 7.9 (L) 12/09/2023 0520   HGB 14.0 07/11/2011 1747   HCT 27.0 (L) 12/09/2023 0520   HCT 44.3 07/11/2011 1747   PLT 296 12/09/2023 0520  PLT 276 07/11/2011 1747   MCV 90.0 12/09/2023 0520   MCV 86 07/11/2011 1747   MCH 26.3 12/09/2023 0520   MCHC 29.3 (L) 12/09/2023 0520   RDW 24.2 (H) 12/09/2023 0520   RDW 15.1 (H) 07/11/2011 1747   LYMPHSABS 1.3 12/08/2023 1515   MONOABS 0.6 12/08/2023 1515   EOSABS 0.1 12/08/2023 1515   BASOSABS 0.0 12/08/2023 1515       Latest Ref Rng & Units 12/09/2023    5:20 AM 12/08/2023    3:15 PM 10/28/2023    3:11 PM  CMP  Glucose 70 - 99 mg/dL 99  895  893   BUN 8 - 23 mg/dL 43  50  28   Creatinine 0.44 - 1.00 mg/dL 8.59  8.28  8.94   Sodium 135 - 145 mmol/L 155  156  141   Potassium 3.5 - 5.1 mmol/L 3.9  4.1  3.6   Chloride 98  - 111 mmol/L 120  115  105   CO2 22 - 32 mmol/L 25  28  27    Calcium  8.9 - 10.3 mg/dL 8.9  9.5  9.2   Total Protein 6.5 - 8.1 g/dL 6.2  6.5    Total Bilirubin 0.0 - 1.2 mg/dL 1.0  0.8    Alkaline Phos 38 - 126 U/L 49  48    AST 15 - 41 U/L 21  24    ALT 0 - 44 U/L 13  14        Microbiology: Recent Results (from the past 240 hours)  Resp panel by RT-PCR (RSV, Flu A&B, Covid) Anterior Nasal Swab     Status: None   Collection Time: 12/08/23  2:56 PM   Specimen: Anterior Nasal Swab  Result Value Ref Range Status   SARS Coronavirus 2 by RT PCR NEGATIVE NEGATIVE Final    Comment: (NOTE) SARS-CoV-2 target nucleic acids are NOT DETECTED.  The SARS-CoV-2 RNA is generally detectable in upper respiratory specimens during the acute phase of infection. The lowest concentration of SARS-CoV-2 viral copies this assay can detect is 138 copies/mL. A negative result does not preclude SARS-Cov-2 infection and should not be used as the sole basis for treatment or other patient management decisions. A negative result may occur with  improper specimen collection/handling, submission of specimen other than nasopharyngeal swab, presence of viral mutation(s) within the areas targeted by this assay, and inadequate number of viral copies(<138 copies/mL). A negative result must be combined with clinical observations, patient history, and epidemiological information. The expected result is Negative.  Fact Sheet for Patients:  BloggerCourse.com  Fact Sheet for Healthcare Providers:  SeriousBroker.it  This test is no t yet approved or cleared by the [place]  FDA and  has been authorized for detection and/or diagnosis of SARS-CoV-2 by FDA under an Emergency Use Authorization (EUA). This EUA will remain  in effect (meaning this test can be used) for the duration of the COVID-19 declaration under Section 564(b)(1) of the Act, 21 U.S.C.section  360bbb-3(b)(1), unless the authorization is terminated  or revoked sooner.       Influenza A by PCR NEGATIVE NEGATIVE Final   Influenza B by PCR NEGATIVE NEGATIVE Final    Comment: (NOTE) The Xpert Xpress SARS-CoV-2/FLU/RSV plus assay is intended as an aid in the diagnosis of influenza from Nasopharyngeal swab specimens and should not be used as a sole basis for treatment. Nasal washings and aspirates are unacceptable for Xpert Xpress SARS-CoV-2/FLU/RSV testing.  Fact Sheet for Patients: BloggerCourse.com  Fact Sheet for Healthcare Providers: SeriousBroker.it  This test is not yet approved or cleared by the [place]  FDA and has been authorized for detection and/or diagnosis of SARS-CoV-2 by FDA under an Emergency Use Authorization (EUA). This EUA will remain in effect (meaning this test can be used) for the duration of the COVID-19 declaration under Section 564(b)(1) of the Act, 21 U.S.C. section 360bbb-3(b)(1), unless the authorization is terminated or revoked.     Resp Syncytial Virus by PCR NEGATIVE NEGATIVE Final    Comment: (NOTE) Fact Sheet for Patients: BloggerCourse.com  Fact Sheet for Healthcare Providers: SeriousBroker.it  This test is not yet approved or cleared by the [place]  FDA and has been authorized for detection and/or diagnosis of SARS-CoV-2 by FDA under an Emergency Use Authorization (EUA). This EUA will remain in effect (meaning this test can be used) for the duration of the COVID-19 declaration under Section 564(b)(1) of the Act, 21 U.S.C. section 360bbb-3(b)(1), unless the authorization is terminated or revoked.  Performed at [facility], [address] [place], [place] [address]   Blood culture (routine x 2)     Status: None (Preliminary result)   Collection Time: 12/08/23  5:17 PM   Specimen: BLOOD  Result Value Ref Range  Status   Specimen Description BLOOD BLOOD RIGHT FOREARM  Final   Special Requests   Final    BOTTLES DRAWN AEROBIC AND ANAEROBIC Blood Culture results may not be optimal due to an inadequate volume of blood received in culture bottles   Culture  Setup Time   Final    GRAM POSITIVE COCCI ANAEROBIC BOTTLE ONLY Organism ID to follow CRITICAL RESULT CALLED TO, READ BACK BY AND VERIFIED WITHBETHA MAFFUCCI [facility] AT 1042 12/09/23 RAM Performed at [facility], [address], [place], [place] [address]    Culture GRAM POSITIVE COCCI  Final   Report Status PENDING  Incomplete  Blood Culture ID Panel (Reflexed)     Status: Abnormal   Collection Time: 12/08/23  5:17 PM  Result Value Ref Range Status   Enterococcus faecalis NOT DETECTED NOT DETECTED Final   Enterococcus Faecium NOT DETECTED NOT DETECTED Final   Listeria monocytogenes NOT DETECTED NOT DETECTED Final   Staphylococcus species DETECTED (A) NOT DETECTED Final    Comment: CRITICAL RESULT CALLED TO, READ BACK BY AND VERIFIED WITH: MAFFUCCI CLOSE AT 1042 12/09/23 RAM    Staphylococcus aureus (BCID) DETECTED (A) NOT DETECTED Final    Comment: CRITICAL RESULT CALLED TO, READ BACK BY AND VERIFIED WITH: MAFFUCCI CLOSE AT 1042 12/09/23 RAM    Staphylococcus epidermidis NOT DETECTED NOT DETECTED Final   Staphylococcus lugdunensis NOT DETECTED NOT DETECTED Final   Streptococcus species NOT DETECTED NOT DETECTED Final   Streptococcus agalactiae NOT DETECTED NOT DETECTED Final   Streptococcus pneumoniae NOT DETECTED NOT DETECTED Final   Streptococcus pyogenes NOT DETECTED NOT DETECTED Final   A.calcoaceticus-baumannii NOT DETECTED NOT DETECTED Final   Bacteroides fragilis NOT DETECTED NOT DETECTED Final   Enterobacterales NOT DETECTED NOT DETECTED Final   Enterobacter cloacae complex NOT DETECTED NOT DETECTED Final   Escherichia coli NOT DETECTED NOT DETECTED Final   Klebsiella aerogenes NOT DETECTED NOT DETECTED Final   Klebsiella  oxytoca NOT DETECTED NOT DETECTED Final   Klebsiella pneumoniae NOT DETECTED NOT DETECTED Final   Proteus species NOT DETECTED NOT DETECTED Final   Salmonella species NOT DETECTED NOT DETECTED Final   Serratia marcescens NOT DETECTED NOT DETECTED Final   Haemophilus influenzae NOT DETECTED  NOT DETECTED Final   Neisseria meningitidis NOT DETECTED NOT DETECTED Final   Pseudomonas aeruginosa NOT DETECTED NOT DETECTED Final   Stenotrophomonas maltophilia NOT DETECTED NOT DETECTED Final   Candida albicans NOT DETECTED NOT DETECTED Final   Candida auris NOT DETECTED NOT DETECTED Final   Candida glabrata NOT DETECTED NOT DETECTED Final   Candida krusei NOT DETECTED NOT DETECTED Final   Candida parapsilosis NOT DETECTED NOT DETECTED Final   Candida tropicalis NOT DETECTED NOT DETECTED Final   Cryptococcus neoformans/gattii NOT DETECTED NOT DETECTED Final   Meth resistant mecA/C and MREJ NOT DETECTED NOT DETECTED Final    Comment: Performed at [facility], [address] [place], [place] [address]  Blood culture (routine x 2)     Status: None (Preliminary result)   Collection Time: 12/08/23  7:08 PM   Specimen: BLOOD  Result Value Ref Range Status   Specimen Description BLOOD BLOOD LEFT ARM  Final   Special Requests   Final    BOTTLES DRAWN AEROBIC AND ANAEROBIC Blood Culture adequate volume   Culture   Final    NO GROWTH < 12 HOURS Performed at [facility], [address] [place], [place] [address]    Report Status PENDING  Incomplete      IMAGING RESULTS:  I have personally reviewed the films B/l high density lesions within kidneys likely is proteinaceous or hemorrhagic cysts  CXR no inflitrate- cardiolmegaly  Impression/Recommendation ?Acute metabolic encephalopathy due to dehydration, hypernatremia and infection ? Hypotension- sepsis /dehydration  Sepsis due to MSSA bacteremia and possible  UTI due to foley  MSSA bacteremia 1/4- she has some chronic  skin wounds- left heel eschar- dry  And superficial sacral decub These could be source Observe for rt shoulder septic arthritis Change ceftriaxone  to cefazolin  to be more focused for MSSA  Complicated UTI possible Has foley changed in ED B/l renal proteinaceous cysts VS hemorrhage Will add levaquin  till urine culture results  Hypernatremia- secondary to dehydration/poor intake  AKI on CKD-   Anemia ( r/o bleed)   On coumadin  high INR Received Vitamin K  Coumadin  on hold  Afib  This consult involved complex antimicrobial management I have personally spent  -75--minutes involved in face-to-face and non-face-to-face activities for this patient on the day of the visit. Professional time spent includes the following activities: Preparing to see the patient (review of tests), Obtaining and/or reviewing separately obtained history (admission/discharge record), Performing a medically appropriate examination and/or evaluation , Ordering medications/tests/procedures, referring and communicating with other health care professionals, Documenting clinical information in the EMR, Independently interpreting results (not separately reported), Communicating results to the son, Counseling and educating the son and Care coordination (not separately reported) with pharmacist.    ________________________________________________ On call ID available by phone for urgent questions this weekend

## 2023-12-09 NOTE — Assessment & Plan Note (Signed)
 EKG with A-fib, rate seems controlled. - Continuing carvedilol  -Holding Coumadin  due to supra therapeutic INR

## 2023-12-09 NOTE — Assessment & Plan Note (Signed)
-   Continue home statin

## 2023-12-09 NOTE — Assessment & Plan Note (Signed)
 Patient had a chronic Foley catheter in place with history of urge urinary incontinence. Foley was exchanged in the ED. - Continue with Foley catheter care

## 2023-12-09 NOTE — ED Notes (Signed)
 Caleen, MD, made aware of INR 10.1

## 2023-12-09 NOTE — Assessment & Plan Note (Signed)
 MSSA bacteremia. UA concerning for UTI, urine cultures were ordered as add-on today.  Preliminary blood cultures with 1/4 bottles with MSSA. Hypothermia has been improved. - Continue with ceftriaxone  -ID was consulted -Continue supportive care

## 2023-12-09 NOTE — Assessment & Plan Note (Signed)
 Likely secondary to severe dehydration and poor p.o. intake, creatinine with some improvement to 1.4 today, still above baseline of about 1.1 -Continue with IV fluid -Monitor renal function -Avoid nephrotoxins

## 2023-12-09 NOTE — Progress Notes (Signed)
 PHARMACY - PHYSICIAN COMMUNICATION CRITICAL VALUE ALERT - BLOOD CULTURE IDENTIFICATION (BCID)  [name] is an 89 y.o. female who presented to [facility] on 12/08/2023 with a chief complaint of confusion  Assessment: Blood culture from 9/18 with GPC in 1 of 4 bottles, BCID detects MSSA.  She has chronic foley and admitted for sepsis with UTI.    Name of physician (or Provider) Contacted: Drs Caleen and Fayette  Current antibiotics: Ceftriaxone   Changes to prescribed antibiotics recommended:  Auto-ID consult  Results for orders placed or performed during the hospital encounter of 12/08/23  Blood Culture ID Panel (Reflexed) (Collected: 12/08/2023  5:17 PM)  Result Value Ref Range   Enterococcus faecalis NOT DETECTED NOT DETECTED   Enterococcus Faecium NOT DETECTED NOT DETECTED   Listeria monocytogenes NOT DETECTED NOT DETECTED   Staphylococcus species DETECTED (A) NOT DETECTED   Staphylococcus aureus (BCID) DETECTED (A) NOT DETECTED   Staphylococcus epidermidis NOT DETECTED NOT DETECTED   Staphylococcus lugdunensis NOT DETECTED NOT DETECTED   Streptococcus species NOT DETECTED NOT DETECTED   Streptococcus agalactiae NOT DETECTED NOT DETECTED   Streptococcus pneumoniae NOT DETECTED NOT DETECTED   Streptococcus pyogenes NOT DETECTED NOT DETECTED   A.calcoaceticus-baumannii NOT DETECTED NOT DETECTED   Bacteroides fragilis NOT DETECTED NOT DETECTED   Enterobacterales NOT DETECTED NOT DETECTED   Enterobacter cloacae complex NOT DETECTED NOT DETECTED   Escherichia coli NOT DETECTED NOT DETECTED   Klebsiella aerogenes NOT DETECTED NOT DETECTED   Klebsiella oxytoca NOT DETECTED NOT DETECTED   Klebsiella pneumoniae NOT DETECTED NOT DETECTED   Proteus species NOT DETECTED NOT DETECTED   Salmonella species NOT DETECTED NOT DETECTED   Serratia marcescens NOT DETECTED NOT DETECTED   Haemophilus influenzae NOT DETECTED NOT DETECTED   Neisseria meningitidis NOT DETECTED NOT DETECTED    Pseudomonas aeruginosa NOT DETECTED NOT DETECTED   Stenotrophomonas maltophilia NOT DETECTED NOT DETECTED   Candida albicans NOT DETECTED NOT DETECTED   Candida auris NOT DETECTED NOT DETECTED   Candida glabrata NOT DETECTED NOT DETECTED   Candida krusei NOT DETECTED NOT DETECTED   Candida parapsilosis NOT DETECTED NOT DETECTED   Candida tropicalis NOT DETECTED NOT DETECTED   Cryptococcus neoformans/gattii NOT DETECTED NOT DETECTED   Meth resistant mecA/C and MREJ NOT DETECTED NOT DETECTED    Celestine Slovak, PharmD, BCPS, BCIDP Work Cell: [phone] 12/09/2023 10:56 AM

## 2023-12-09 NOTE — Consult Note (Signed)
 PHARMACY - ANTICOAGULATION CONSULT NOTE  Pharmacy Consult for warfarin management Indication: Afib   Allergies  Allergen Reactions   Penicillins Rash    Has patient had a PCN reaction causing immediate rash, facial/tongue/throat swelling, SOB or lightheadedness with hypotension: Unknown Has patient had a PCN reaction causing severe rash involving mucus membranes or skin necrosis: Unknown Has patient had a PCN reaction that required hospitalization: Unknown Has patient had a PCN reaction occurring within the last 10 years: Unknown If all of the above answers are NO, then may proceed with Cephalosporin use.     Patient Measurements:    Vital Signs: Temp: 98 F (36.7 C) (09/19 0500) Temp Source: Oral (09/19 0500) BP: 122/59 (09/19 0700) Pulse Rate: 87 (09/19 0715)  Labs: Recent Labs    12/08/23 1515 12/08/23 1717 12/09/23 0520  HGB 9.3*  --  7.9*  HCT 31.1*  --  27.0*  PLT 334  --  296  LABPROT 75.4*  --  83.9*  INR 8.8*  --  10.1*  CREATININE 1.71*  --  1.40*  TROPONINIHS 94* 89*  --     CrCl cannot be calculated (Unknown ideal weight.).   Medical History: Past Medical History:  Diagnosis Date   Arthritis    Atrial fibrillation (HCC)    Hypertension    Urge urinary incontinence     Medications:  Reported home regimen is Warfarin 1 mg po daily with last dose 12/08/23  Assessment: 89 yo female presented to ED due to altered mental status.  INR ED patient found to have supratherapeutic INR.  Pharmacy consulted to manage warfarin dosing.  Goal of Therapy:  INR 2-3 Monitor platelets by anticoagulation protocol: Yes   Date INR Warfarin Dose  9/18 8.8 1 mg (PTA)  9/19 10.1 Hold     Plan:  INR supratherapeutic 10.1 Provider has ordered Vitamin K  5 mg IV x 1 Will hold warfarin dose for today Daily INR while admitted and CBC at least every 72 hours  Kayla JULIANNA Blew, PharmD, BCPS 12/09/2023,8:29 AM

## 2023-12-10 ENCOUNTER — Other Ambulatory Visit: Payer: Self-pay

## 2023-12-10 ENCOUNTER — Inpatient Hospital Stay

## 2023-12-10 DIAGNOSIS — N179 Acute kidney failure, unspecified: Secondary | ICD-10-CM | POA: Diagnosis not present

## 2023-12-10 DIAGNOSIS — N390 Urinary tract infection, site not specified: Secondary | ICD-10-CM | POA: Diagnosis not present

## 2023-12-10 DIAGNOSIS — Z515 Encounter for palliative care: Secondary | ICD-10-CM | POA: Diagnosis not present

## 2023-12-10 DIAGNOSIS — Z7189 Other specified counseling: Secondary | ICD-10-CM | POA: Diagnosis not present

## 2023-12-10 DIAGNOSIS — E876 Hypokalemia: Secondary | ICD-10-CM | POA: Insufficient documentation

## 2023-12-10 DIAGNOSIS — Z789 Other specified health status: Secondary | ICD-10-CM

## 2023-12-10 DIAGNOSIS — R531 Weakness: Secondary | ICD-10-CM

## 2023-12-10 DIAGNOSIS — R7881 Bacteremia: Secondary | ICD-10-CM | POA: Diagnosis not present

## 2023-12-10 DIAGNOSIS — R4182 Altered mental status, unspecified: Secondary | ICD-10-CM

## 2023-12-10 DIAGNOSIS — Z66 Do not resuscitate: Secondary | ICD-10-CM

## 2023-12-10 DIAGNOSIS — E870 Hyperosmolality and hypernatremia: Secondary | ICD-10-CM | POA: Diagnosis not present

## 2023-12-10 LAB — BASIC METABOLIC PANEL WITH GFR
Anion gap: 11 (ref 5–15)
BUN: 27 mg/dL — ABNORMAL HIGH (ref 8–23)
CO2: 26 mmol/L (ref 22–32)
Calcium: 8.5 mg/dL — ABNORMAL LOW (ref 8.9–10.3)
Chloride: 111 mmol/L (ref 98–111)
Creatinine, Ser: 1.15 mg/dL — ABNORMAL HIGH (ref 0.44–1.00)
GFR, Estimated: 46 mL/min — ABNORMAL LOW (ref >60)
Glucose, Bld: 94 mg/dL (ref 70–99)
Potassium: 3.1 mmol/L — ABNORMAL LOW (ref 3.5–5.1)
Sodium: 148 mmol/L — ABNORMAL HIGH (ref 135–145)

## 2023-12-10 LAB — CBC
HCT: 23.7 % — ABNORMAL LOW (ref 36.0–46.0)
Hemoglobin: 7.2 g/dL — ABNORMAL LOW (ref 12.0–15.0)
MCH: 27 pg (ref 26.0–34.0)
MCHC: 30.4 g/dL (ref 30.0–36.0)
MCV: 88.8 fL (ref 80.0–100.0)
Platelets: 242 K/uL (ref 150–400)
RBC: 2.67 MIL/uL — ABNORMAL LOW (ref 3.87–5.11)
RDW: 23.8 % — ABNORMAL HIGH (ref 11.5–15.5)
WBC: 7.8 K/uL (ref 4.0–10.5)
nRBC: 0 % (ref 0.0–0.2)

## 2023-12-10 LAB — GLUCOSE, CAPILLARY: Glucose-Capillary: 100 mg/dL — ABNORMAL HIGH (ref 70–99)

## 2023-12-10 LAB — PROTIME-INR
INR: 1.7 — ABNORMAL HIGH (ref 0.8–1.2)
Prothrombin Time: 21.1 s — ABNORMAL HIGH (ref 11.4–15.2)

## 2023-12-10 LAB — MAGNESIUM: Magnesium: 2.1 mg/dL (ref 1.7–2.4)

## 2023-12-10 LAB — ABO/RH: ABO/RH(D): A POS

## 2023-12-10 LAB — PREPARE RBC (CROSSMATCH)

## 2023-12-10 LAB — VITAMIN B12: Vitamin B-12: 1240 pg/mL — ABNORMAL HIGH (ref 180–914)

## 2023-12-10 MED ORDER — DEXTROSE 5 % IV SOLN: INTRAVENOUS | Status: DC

## 2023-12-10 MED ORDER — WARFARIN SODIUM 1 MG PO TABS
1.0000 mg | ORAL_TABLET | Freq: Once | ORAL | Status: AC
  Administered 2023-12-10: 1 mg via ORAL
  Filled 2023-12-10: qty 1

## 2023-12-10 MED ORDER — SODIUM CHLORIDE 0.9% IV SOLUTION: Freq: Once | INTRAVENOUS | Status: AC

## 2023-12-10 MED ORDER — ENSURE PLUS HIGH PROTEIN PO LIQD
237.0000 mL | Freq: Two times a day (BID) | ORAL | Status: DC
  Administered 2023-12-11: 237 mL via ORAL

## 2023-12-10 MED ORDER — POTASSIUM CHLORIDE 20 MEQ PO PACK
40.0000 meq | PACK | Freq: Two times a day (BID) | ORAL | Status: DC
  Administered 2023-12-10: 20 meq via ORAL
  Filled 2023-12-10: qty 2

## 2023-12-10 MED ORDER — LIDOCAINE 5 % EX PTCH
1.0000 | MEDICATED_PATCH | CUTANEOUS | Status: DC
  Administered 2023-12-10 – 2023-12-20 (×11): 1 via TRANSDERMAL
  Filled 2023-12-10 (×11): qty 1

## 2023-12-10 MED ORDER — POTASSIUM CL IN DEXTROSE 5% 20 MEQ/L IV SOLN
20.0000 meq | INTRAVENOUS | Status: DC
Start: 2023-12-10 — End: 2023-12-11
  Administered 2023-12-10 (×2): 20 meq via INTRAVENOUS
  Filled 2023-12-10 (×2): qty 1000

## 2023-12-10 NOTE — Assessment & Plan Note (Signed)
 Iron deficiency anemia mentioned in her chart, hemoglobin improved to 9.2 s/p 1 unit of PRBC Anemia panel consistent with anemia of chronic disease and mild iron deficiency -Monitor hemoglobin -Transfuse if below 7

## 2023-12-10 NOTE — Progress Notes (Signed)
 Progress Note   Patient: Cassandra Weiss FMW:[id number] DOB: 1932-08-26 DOA: 12/08/2023     2 DOS: the patient was seen and examined on 12/10/2023   Brief hospital course: Partly taken from H&P.  Cassandra Weiss is a 89 y.o. female with medical history significant of atrial fibrillation, hypertension, urge incontinence, hyperlipidemia coronary artery disease, iron deficiency anemia who was brought to the ER with altered mental status and hypotension.  Patient apparently has been less responsive for about 2 days.  Patient has chronic indwelling catheter.   On presentation patient was hypothermic at 95.6, borderline soft blood pressure, sodium of 156, hemoglobin 9.3, chloride 115, BUN 50, creatinine 1.71, INR of 8.8 on warfarin, respiratory panel negative.  UA concerning for UTI.  CT renal stone shows nonobstructing stone, otherwise unremarkable. CT head was negative for any acute abnormality. CXR with no acute cardiopulmonary abnormalities.  Patient was admitted for concern of sepsis secondary to UTI and started on ceftriaxone .  Blood cultures were drawn.   9/19: Vitals stable, hemoglobin decreased to 7.9 and INR increased to 10.1, no obvious bleeding, giving vitamin K .  Sodium at 155, creatinine with some improvement to 1.40-baseline seems to be around 1.1 Ordered urine culture as add-on.  Preliminary blood culture 1 out of 4 bottles with MSSA. Switching IV fluid to D5 with water deficit of 2.9L. Patient with significant underlying comorbidities, advanced age and poor functional status-consulting palliative care to discuss goals of care.  At baseline patient just walked with the help of walker from bed to her recliner, completely dependent for all ADLs.  Son manages the medications but patient does not take them regularly.  Lives with husband of the similar age, had a caregiver during the day.  9/20: Remained quite lethargic and somnolent, CODE STATUS changed to DNR after palliative care meeting  but family wants maximum medical intervention.  Right shoulder imaging was obtained due to concern of recent concern of joint effusion in July with severe osteoarthritis.  MSSA bacteremia can be due to few smaller skin lesions and injuries, if there is a joint effusion we will ask Ortho to tap to rule out any septic arthritis. Urine cultures growing E. Coli.  INR improved to 1.7, hemoglobin decreased to 7.2, no active bleeding-did 1 unit of PRBC.  Replacing potassium, sodium improved to 148 with improving renal function-continuing D5 with potassium for another day.   Assessment and Plan: * Sepsis secondary to UTI (HCC) MSSA bacteremia. UA concerning for UTI, urine cultures growing E. coli preliminary blood cultures with  MSSA.  Right shoulder imaging ordered to rule out any joint effusion as it might need arthrocentesis to rule out septic joint.  Multiple small skin lesions likely the source of infection. Hypothermia has been improved. - Ceftriaxone  was switched to cefazolin  -IV Levaquin  was added by ID -ID was consulted -Continue supportive care  AKI (acute kidney injury) (HCC) Likely secondary to severe dehydration and poor p.o. intake, creatinine continued to improve and now at 1.15 which is around baseline -Continue with IV fluid -Monitor renal function -Avoid nephrotoxins  Hypernatremia Improving with sodium of 148 today, water deficit of 1.6 L Patient is severely dehydrated with poor p.o. intake. -Continue with D5 for another day -monitor sodium  Acute metabolic encephalopathy Patient is currently oriented to name only and not been able to participate with any meaningful history or care. Concern of underlying advanced dementia. - Delirium precautions.  Benign essential HTN Blood pressure borderline soft. - Holding home antihypertensives -Monitor blood  pressure  Paroxysmal A-fib (HCC) EKG with A-fib, rate seems controlled. - Continuing carvedilol  -Holding Coumadin  due to  supra therapeutic INR  Hypercoagulable state (HCC) INR improved to 1.7 s/p vitamin K  No obvious bleeding but hemoglobin dropped to 7.2. -Monitor INR -Coumadin  per pharmacy  Coronary artery disease Unable to explain any chest pain, barely positive troponin with a flat curve. - Continuing home carvedilol  and statin  Iron deficiency anemia Iron deficiency anemia mentioned in her chart, hemoglobin 7.2 today  Anemia panel consistent with anemia of chronic disease and mild iron deficiency -Ordered 1 unit of PRBC -Monitor hemoglobin -Transfuse if below 7  Hyperlipidemia - Continue home statin  Urge urinary incontinence Patient had a chronic Foley catheter in place with history of urge urinary incontinence. Foley was exchanged in the ED. - Continue with Foley catheter care  Hypokalemia Potassium of 3.1 with normal magnesium. - Replace potassium and monitor  Subjective: Patient remained quite lethargic and somnolent, barely open eyes when called her name, following some commands.  Physical Exam: Vitals:   12/09/23 1944 12/09/23 2131 12/10/23 0336 12/10/23 0942  BP: (!) 132/51 (!) 122/53 (!) 103/44 103/80  Pulse: 78 60 76 83  Resp: 20  16   Temp: (!) 97.5 F (36.4 C)  97.7 F (36.5 C)   TempSrc:      SpO2: 99%  100%    General.  Frail and malnourished elderly lady, in no acute distress. Pulmonary.  Lungs clear bilaterally, normal respiratory effort. CV.  Regular rate and rhythm, no JVD, rub or murmur. Abdomen.  Soft, nontender, nondistended, BS positive. CNS.  Lethargic and somnolent, following some commands Extremities.  No edema,  pulses intact and symmetrical. Psychiatry.  Judgment and insight appears impaired  Data Reviewed: Prior data reviewed.  Family Communication: Discussed with son at bedside  Disposition: Status is: Inpatient Remains inpatient appropriate because: Severity of illness  Planned Discharge Destination: To be determined  DVT prophylaxis.   Coumadin -currently on hold due to supratherapeutic INR Time spent: 50 minutes  This record has been created using Conservation officer, historic buildings. Errors have been sought and corrected,but may not always be located. Such creation errors do not reflect on the standard of care.   Author: Amaryllis Dare, MD 12/10/2023 4:25 PM  For on call review [email or web address].

## 2023-12-10 NOTE — Progress Notes (Signed)
 Palliative Care Progress Note, Assessment & Plan   Patient Name: Cassandra Weiss       Date: 12/10/2023 DOB: 1932-11-18  Age: 89 y.o. MRN#: [id number] Attending Physician: Caleen Qualia, MD Primary Care Physician: Sadie Manna, MD Admit Date: 12/08/2023  Subjective: States she does not feel well today.  She did not sleep well last night she denies pain.  HPI: Per previous H&P: Cassandra Weiss is a 89 y.o. female with medical history significant of atrial fibrillation, hypertension, urge incontinence, hyperlipidemia coronary artery disease, iron deficiency anemia who was brought to the ER with altered mental status and hypotension.  Patient apparently has been less responsive for about 2 days.  Was also found to be hypotensive at home was blood pressure 88/40.  Patient has chronic indwelling catheter.  Patient is currently unable to give any history and no family at bedside.  She was found to have met sepsis criteria with hypothermia temperature 95.6, blood pressure is 93/42 here she has a sodium of 156 hemoglobin 9.3 chloride 115, BUN is 50 creatinine 1.71.  Patient is on warfarin and INR is 8.8 glucose 104.  Acute viral screen is negative.  Analysis positive for UTI.  Patient also had CT renal stone that showed nonobstructive stone otherwise.  Patient is being admitted for sepsis due to UTI .   Palliative was consulted to assist with goals of care conversations.  Summary of counseling/coordination of care: Extensive chart review completed prior to meeting patient including labs, vital signs, imaging, progress notes, orders, and available advanced directive documents from current and previous encounters.     Latest Ref Rng & Units 12/10/2023   10:23 AM 12/09/2023    5:20 AM 12/08/2023    3:15 PM  CBC  WBC  4.0 - 10.5 K/uL 7.8  8.8  8.5   Hemoglobin 12.0 - 15.0 g/dL 7.2  7.9  9.3   Hematocrit 36.0 - 46.0 % 23.7  27.0  31.1   Platelets 150 - 400 K/uL 242  296  334        Latest Ref Rng & Units 12/10/2023   10:23 AM 12/09/2023    5:20 AM 12/08/2023    3:15 PM  CMP  Glucose 70 - 99 mg/dL 94  99  895   BUN 8 - 23 mg/dL 27  43  50   Creatinine 0.44 - 1.00 mg/dL 8.84  8.59  8.28   Sodium 135 - 145 mmol/L 148  155  156   Potassium 3.5 - 5.1 mmol/L 3.1  3.9  4.1   Chloride 98 - 111 mmol/L 111  120  115   CO2 22 - 32 mmol/L 26  25  28    Calcium  8.9 - 10.3 mg/dL 8.5  8.9  9.5   Total Protein 6.5 - 8.1 g/dL  6.2  6.5   Total Bilirubin 0.0 - 1.2 mg/dL  1.0  0.8   Alkaline Phos 38 - 126 U/L  49  48   AST 15 - 41 U/L  21  24   ALT 0 - 44 U/L  13  14      After reviewing the patient's chart and assessing the patient at bedside, I  spoke with patient's son in regards to symptom management and goals of care.   Ill-appearing, elderly female lying in bed.  She awakens to verbal stimuli and is able to answer a few questions but immediately falls back to sleep.  She is too lethargic to participate in goals of care conversation.  Respirations are even and unlabored.  She is in no distress.  Son at bedside.  Discussed at length with Cassandra Weiss, son, goals of care for his mother.  He shares that after speaking with his father they decided that patient would not do well with CPR and fear of breaking ribs.  He does however state that if she needed support for breathing, he would want her placed on a ventilator if needed.  Cassandra Weiss also voices concern over his mother not eating and drinking.  He states this started approximately 3 months ago after a fall where he has seen an overall decline in function with decreased p.o. intake.  He request nutritional support to see if his mother would bounce back and become more alert.  He states he wants to give her every chance to get better but also understands if she is not eating  and drinking due to being end-of-life he is ready to except that as well.   Cassandra Weiss does not want his mother to have a PEG tube.  Discussed this plan of care with Dr. Caleen who advises they will try Ensure and boost nutritional drinks at this time.  Therapeutic silence and active listening provided for family to share their thoughts and emotions regarding current medical situation.  Emotional support provided.  Physical Exam Vitals reviewed.  Constitutional:      General: She is not in acute distress.    Appearance: She is ill-appearing.     Comments: Lethargic  HENT:     Head: Normocephalic and atraumatic.     Mouth/Throat:     Mouth: Mucous membranes are dry.  Pulmonary:     Effort: Pulmonary effort is normal. No respiratory distress.  Skin:    General: Skin is warm and dry.  Neurological:     Mental Status: She is disoriented.   Recommendations/Plan: DNR uploaded to Epic via [employer], signed DNR placed in paper chart Continue current supportive interventions Palliative will continue to follow for needs Discharge TBD       Total Time 65 minutes    Discussed plan of care with Dr. Caleen and primary RN  Time spent includes: Detailed review of medical records (labs, imaging, vital signs), medically appropriate exam (mental status, respiratory, cardiac, skin), discussed with treatment team, counseling and educating patient, family and staff, documenting clinical information, medication management and coordination of care.     Devere Sacks, [facility] Palliative Medicine Team  12/10/2023 8:50 AM  Office [phone]  Pager [phone]

## 2023-12-10 NOTE — Consult Note (Signed)
 PHARMACY - ANTICOAGULATION CONSULT NOTE  Pharmacy Consult for warfarin management Indication: Afib   Allergies  Allergen Reactions   Penicillins Rash    Has patient had a PCN reaction causing immediate rash, facial/tongue/throat swelling, SOB or lightheadedness with hypotension: Unknown Has patient had a PCN reaction causing severe rash involving mucus membranes or skin necrosis: Unknown Has patient had a PCN reaction that required hospitalization: Unknown Has patient had a PCN reaction occurring within the last 10 years: Unknown If all of the above answers are NO, then may proceed with Cephalosporin use.     Patient Measurements:    Vital Signs: Temp: 97.7 F (36.5 C) (09/20 0336) BP: 103/80 (09/20 0942) Pulse Rate: 83 (09/20 0942)  Labs: Recent Labs    12/08/23 1515 12/08/23 1717 12/09/23 0520 12/10/23 0639 12/10/23 1023  HGB 9.3*  --  7.9*  --  7.2*  HCT 31.1*  --  27.0*  --  23.7*  PLT 334  --  296  --  242  LABPROT 75.4*  --  83.9* 21.1*  --   INR 8.8*  --  10.1* 1.7*  --   CREATININE 1.71*  --  1.40*  --   --   TROPONINIHS 94* 89*  --   --   --     CrCl cannot be calculated (Unknown ideal weight.).   Medical History: Past Medical History:  Diagnosis Date   Arthritis    Atrial fibrillation (HCC)    Hypertension    Urge urinary incontinence     Medications:  Reported home regimen is Warfarin 1 mg po daily with last dose 12/08/23  Assessment: 89 yo female presented to ED due to altered mental status.  INR ED patient found to have supratherapeutic INR.  Pharmacy consulted to manage warfarin dosing.  Goal of Therapy:  INR 2-3 Monitor platelets by anticoagulation protocol: Yes   Date INR Warfarin Dose  9/18 8.8 1 mg (PTA)  9/19 10.1 Hold. IV vitamin K  given.   9/20 1.7 1 mg     Plan:  INR is subtherapeutic. Will give pt warfairn 1 mg (home dose) x 1. Daily INR. CBC at least every 3 days.   Cathaleen GORMAN Blanch, PharmD, BCPS 12/10/2023,11:42 AM

## 2023-12-10 NOTE — Assessment & Plan Note (Signed)
 MSSA bacteremia. UA concerning for UTI, urine cultures growing pansensitive E. coli, preliminary blood cultures with  MSSA.  Right shoulder imaging with severe degenerative changes, no effusion.  Multiple small skin lesions likely the source of infection. Hypothermia has been improved. Echocardiogram with concern of an echogenic lesion on aortic valve-need TEE for further evaluation and to rule out endocarditis-plan for later today - Ceftriaxone  was switched to cefazolin  -Discontinuing Levaquin  -Repeat blood cultures remain negative -ID was consulted -Continue supportive care

## 2023-12-10 NOTE — Progress Notes (Signed)
 Called husband for PICC consent.  No answer.  Called son and spoke with him at 45.  States he would need to speak with his father and would call me right back.  No return call at this time.  Son asked about nutrition for his mother, message sent to Dr Caleen re family question.  No reply at this time.

## 2023-12-10 NOTE — Assessment & Plan Note (Signed)
 Patient is currently oriented to name only and not been able to participate with any meaningful history or care. Concern of underlying advanced dementia. Per son she is now close to baseline - Delirium precautions.

## 2023-12-10 NOTE — Evaluation (Signed)
 Clinical/Bedside Swallow Evaluation Patient Details  Name: Cassandra Weiss MRN: [id number] Date of Birth: [birth year]  Today's Date: 12/10/2023 Time: SLP Start Time (ACUTE ONLY): 1235 SLP Stop Time (ACUTE ONLY): 1300 SLP Time Calculation (min) (ACUTE ONLY): 25 min  Past Medical History:  Past Medical History:  Diagnosis Date   Arthritis    Atrial fibrillation (HCC)    Hypertension    Urge urinary incontinence    Past Surgical History: No past surgical history on file. HPI:  Per H&P, Cassandra Weiss is a 89 y.o. female with medical history significant of atrial fibrillation, hypertension, urge incontinence, hyperlipidemia coronary artery disease, iron deficiency anemia who was brought to the ER with altered mental status and hypotension.  Patient apparently has been less responsive for about 2 days.  Was also found to be hypotensive at home was blood pressure 88/40.  Patient has chronic indwelling catheter.  Patient is currently unable to give any history and no family at bedside.  She was found to have met sepsis criteria with hypothermia temperature 95.6, blood pressure is 93/42 here she has a sodium of 156 hemoglobin 9.3 chloride 115, BUN is 50 creatinine 1.71.  Patient is on warfarin and INR is 8.8 glucose 104.  Acute viral screen is negative.  Analysis positive for UTI.  Patient also had CT renal stone that showed nonobstructive stone otherwise.  Patient is being admitted for sepsis due to UTI CXR No acute cardiopulmonary abnormality. CT Head: Stable chronic small vessel ischemic changes. No acute  intracranial process.  2. Paranasal sinus disease as above.    Assessment / Plan / Recommendation  Clinical Impression  Pt seen for bedside swallow evaluation in the setting of acute metabolic encephalopathy. Pt lethargic upon therapist entrance to room. Opening eyes for brief stents, with quick return to eyes closed. Pt with whispered, brief responses to verbal prompts. Son reporting lethargy t/o  day. RN reporting taking a few bites for breakfast before needing to stop due to drowsy state.   Oral completed with moderate verbal cues for oral opening and movement to better access cavity. Pt completing trials of ice with slow oral manipulation and concern for oral holding vs falling asleep with bolus still in mouth. With moderate verbal/tactile cued pt completing thin liquid straw sips (sequential) of medication. No overt or subtle s/sx pharyngeal dysphagia noted. No change to vocal quality across trials. Given waning alertness solids deferred at this time.   Pt is at significant risk for aspiration based on overall medical compromise, deconditioning, dependency with feeding, AMS, age, and limited alertness. MD and RN aware of risk. Defer to medical team for diet recommendation given limitations of evaluation; however, recommend HOLDING any PO if pt does not demonstrate sufficient, sustained alertness. SLP will follow up with further assessment and recommendations.    SLP Visit Diagnosis: Dysphagia, unspecified (R13.10) (related to current LOA, and AMS related to)    Aspiration Risk  Moderate aspiration risk    Diet Recommendation    (defer to medical team)       Other  Recommendations Oral Care Recommendations: Oral care BID;Oral care before and after PO;Staff/trained caregiver to provide oral care     Assistance Recommended at Discharge  Supervision and support for PO intake  Functional Status Assessment Patient has had a recent decline in their functional status and demonstrates the ability to make significant improvements in function in a reasonable and predictable amount of time.  Frequency and Duration min 2x/week  2 weeks  Prognosis Prognosis for improved oropharyngeal function: Fair Barriers to Reach Goals: Severity of deficits (overall medical picture)      Swallow Study   General Date of Onset: 12/10/23 HPI: Per H&P, Cassandra Weiss is a 89 y.o. female with  medical history significant of atrial fibrillation, hypertension, urge incontinence, hyperlipidemia coronary artery disease, iron deficiency anemia who was brought to the ER with altered mental status and hypotension.  Patient apparently has been less responsive for about 2 days.  Was also found to be hypotensive at home was blood pressure 88/40.  Patient has chronic indwelling catheter.  Patient is currently unable to give any history and no family at bedside.  She was found to have met sepsis criteria with hypothermia temperature 95.6, blood pressure is 93/42 here she has a sodium of 156 hemoglobin 9.3 chloride 115, BUN is 50 creatinine 1.71.  Patient is on warfarin and INR is 8.8 glucose 104.  Acute viral screen is negative.  Analysis positive for UTI.  Patient also had CT renal stone that showed nonobstructive stone otherwise.  Patient is being admitted for sepsis due to UTI CXR No acute cardiopulmonary abnormality. CT Head: Stable chronic small vessel ischemic changes. No acute  intracranial process.  2. Paranasal sinus disease as above. Type of Study: Bedside Swallow Evaluation Previous Swallow Assessment: previous at outside hospital in 2018- recs for regular and thins Diet Prior to this Study: Regular;Thin liquids (Level 0) Temperature Spikes Noted: No Respiratory Status: Room air History of Recent Intubation: No Behavior/Cognition: Requires cueing;Lethargic/Drowsy Oral Cavity Assessment: Within Functional Limits Oral Care Completed by SLP: Yes Oral Cavity - Dentition: Missing dentition Vision:  (total assist provided) Self-Feeding Abilities: Total assist Patient Positioning: Upright in bed Baseline Vocal Quality: Low vocal intensity (whispering) Volitional Cough: Cognitively unable to elicit Volitional Swallow: Unable to elicit    Oral/Motor/Sensory Function Overall Oral Motor/Sensory Function: Within functional limits (based on cursory eval)   Ice Chips Ice chips: Within functional  limits Presentation: Spoon   Thin Liquid Thin Liquid: Within functional limits Presentation: Cup;Straw    Nectar Thick Nectar Thick Liquid: Not tested   Honey Thick Honey Thick Liquid: Not tested   Puree Puree: Not tested   Solid     Solid: Not tested     [place] Takia Runyon Clapp, MS, CCC-SLP Speech Language Pathologist Rehab Services; [facility] [place] [phone] (ascom)   [place] J Clapp 12/10/2023,2:52 PM

## 2023-12-10 NOTE — Assessment & Plan Note (Signed)
 Blood pressure borderline soft. - Holding home antihypertensives -Monitor blood pressure

## 2023-12-10 NOTE — Assessment & Plan Note (Signed)
 Resolved. -monitor sodium

## 2023-12-10 NOTE — Assessment & Plan Note (Signed)
 INR improved to 1.7 s/p vitamin K  No obvious bleeding but hemoglobin dropped to 7.2. -Monitor INR -Coumadin  per pharmacy

## 2023-12-10 NOTE — Assessment & Plan Note (Signed)
 Potassium of 3.1 with normal magnesium. - Replace potassium and monitor

## 2023-12-10 NOTE — Assessment & Plan Note (Signed)
 Likely secondary to severe dehydration and poor p.o. intake, creatinine stable at 1.17 which is around baseline -Continue with IV fluid -Monitor renal function -Avoid nephrotoxins

## 2023-12-10 NOTE — Plan of Care (Signed)

## 2023-12-11 ENCOUNTER — Inpatient Hospital Stay

## 2023-12-11 DIAGNOSIS — N390 Urinary tract infection, site not specified: Secondary | ICD-10-CM | POA: Diagnosis not present

## 2023-12-11 DIAGNOSIS — N3941 Urge incontinence: Secondary | ICD-10-CM | POA: Diagnosis not present

## 2023-12-11 DIAGNOSIS — N179 Acute kidney failure, unspecified: Secondary | ICD-10-CM | POA: Diagnosis not present

## 2023-12-11 DIAGNOSIS — E870 Hyperosmolality and hypernatremia: Secondary | ICD-10-CM | POA: Diagnosis not present

## 2023-12-11 DIAGNOSIS — R7881 Bacteremia: Secondary | ICD-10-CM | POA: Diagnosis not present

## 2023-12-11 DIAGNOSIS — E876 Hypokalemia: Secondary | ICD-10-CM

## 2023-12-11 DIAGNOSIS — Z515 Encounter for palliative care: Secondary | ICD-10-CM | POA: Diagnosis not present

## 2023-12-11 DIAGNOSIS — Z66 Do not resuscitate: Secondary | ICD-10-CM | POA: Diagnosis not present

## 2023-12-11 DIAGNOSIS — Z789 Other specified health status: Secondary | ICD-10-CM | POA: Diagnosis not present

## 2023-12-11 LAB — RENAL FUNCTION PANEL
Albumin: 2.2 g/dL — ABNORMAL LOW (ref 3.5–5.0)
Anion gap: 8 (ref 5–15)
BUN: 20 mg/dL (ref 8–23)
CO2: 26 mmol/L (ref 22–32)
Calcium: 8.7 mg/dL — ABNORMAL LOW (ref 8.9–10.3)
Chloride: 110 mmol/L (ref 98–111)
Creatinine, Ser: 1.17 mg/dL — ABNORMAL HIGH (ref 0.44–1.00)
GFR, Estimated: 45 mL/min — ABNORMAL LOW (ref >60)
Glucose, Bld: 114 mg/dL — ABNORMAL HIGH (ref 70–99)
Phosphorus: 2.3 mg/dL — ABNORMAL LOW (ref 2.5–4.6)
Potassium: 3.5 mmol/L (ref 3.5–5.1)
Sodium: 144 mmol/L (ref 135–145)

## 2023-12-11 LAB — URINE CULTURE: Culture: >=100000 — AB

## 2023-12-11 LAB — CBC
HCT: 28 % — ABNORMAL LOW (ref 36.0–46.0)
Hemoglobin: 9.2 g/dL — ABNORMAL LOW (ref 12.0–15.0)
MCH: 28.5 pg (ref 26.0–34.0)
MCHC: 32.9 g/dL (ref 30.0–36.0)
MCV: 86.7 fL (ref 80.0–100.0)
Platelets: 249 K/uL (ref 150–400)
RBC: 3.23 MIL/uL — ABNORMAL LOW (ref 3.87–5.11)
RDW: 21.7 % — ABNORMAL HIGH (ref 11.5–15.5)
WBC: 8 K/uL (ref 4.0–10.5)
nRBC: 0 % (ref 0.0–0.2)

## 2023-12-11 LAB — BPAM RBC
Blood Product Expiration Date: 202510182359
ISSUE DATE / TIME: 202509201713
Unit Type and Rh: 6200

## 2023-12-11 LAB — TYPE AND SCREEN
ABO/RH(D): A POS
Antibody Screen: NEGATIVE
Unit division: 0

## 2023-12-11 LAB — CULTURE, BLOOD (ROUTINE X 2)

## 2023-12-11 LAB — PROTIME-INR
INR: 2.8 — ABNORMAL HIGH (ref 0.8–1.2)
Prothrombin Time: 31 s — ABNORMAL HIGH (ref 11.4–15.2)

## 2023-12-11 MED ORDER — SODIUM CHLORIDE 0.9% FLUSH
10.0000 mL | Freq: Two times a day (BID) | INTRAVENOUS | Status: DC
  Administered 2023-12-11 – 2023-12-14 (×5): 10 mL
  Administered 2023-12-15: 40 mL
  Administered 2023-12-15 – 2023-12-20 (×10): 10 mL

## 2023-12-11 MED ORDER — ENSURE PLUS HIGH PROTEIN PO LIQD
237.0000 mL | Freq: Three times a day (TID) | ORAL | Status: DC
  Administered 2023-12-11 – 2023-12-19 (×8): 237 mL via ORAL

## 2023-12-11 MED ORDER — LACTATED RINGERS IV SOLN: INTRAVENOUS | Status: AC

## 2023-12-11 MED ORDER — SODIUM CHLORIDE 0.9% FLUSH
10.0000 mL | INTRAVENOUS | Status: DC | PRN
  Administered 2023-12-14: 20 mL

## 2023-12-11 MED ORDER — K PHOS MONO-SOD PHOS DI & MONO 155-852-130 MG PO TABS
500.0000 mg | ORAL_TABLET | Freq: Two times a day (BID) | ORAL | Status: AC
  Administered 2023-12-11 (×2): 500 mg via ORAL
  Filled 2023-12-11 (×2): qty 2

## 2023-12-11 NOTE — Progress Notes (Signed)
 Peripherally Inserted Central Catheter Placement  The IV Nurse has discussed with the patient and/or persons authorized to consent for the patient, the purpose of this procedure and the potential benefits and risks involved with this procedure.  The benefits include less needle sticks, lab draws from the catheter, and the patient may be discharged home with the catheter. Risks include, but not limited to, infection, bleeding, blood clot (thrombus formation), and puncture of an artery; nerve damage and irregular heartbeat and possibility to perform a PICC exchange if needed/ordered by physician.  Alternatives to this procedure were also discussed.  Bard Power PICC patient education guide, fact sheet on infection prevention and patient information card has been provided to patient /or left at bedside. Son signed consent 12/11/23 at bedside.   PICC Placement Documentation  PICC Double Lumen 12/11/23 Left Brachial 36 cm 0 cm (Active)  Indication for Insertion or Continuance of Line Limited venous access - need for IV therapy >5 days (PICC only);Poor Vasculature-patient has had multiple peripheral attempts or PIVs lasting less than 24 hours 12/11/23 0817  Exposed Catheter (cm) 0 cm 12/11/23 0817  Site Assessment Clean, Dry, Intact 12/11/23 0817  Lumen #1 Status Flushed;Saline locked;Blood return noted 12/11/23 0817  Lumen #2 Status Flushed;Saline locked;Blood return noted 12/11/23 0817  Dressing Type Transparent;Securing device 12/11/23 0817  Dressing Status Antimicrobial disc/dressing in place;Clean, Dry, Intact 12/11/23 0817  Line Care Connections checked and tightened 12/11/23 0817  Line Adjustment (NICU/IV Team Only) No 12/11/23 0817  Dressing Intervention New dressing;Adhesive placed at insertion site (IV team only);Adhesive placed around edges of dressing (IV team/ICU RN only) 12/11/23 0817  Dressing Change Due 12/18/23 12/11/23 0817       Cassandra Weiss 12/11/2023, 8:17 AM

## 2023-12-11 NOTE — Assessment & Plan Note (Signed)
 EKG with A-fib, rate seems controlled. - Continuing carvedilol  - Coumadin  per pharmacy

## 2023-12-11 NOTE — Progress Notes (Signed)
 Speech Language Pathology Treatment: Dysphagia  Patient Details Name: Cassandra Weiss MRN: [id number] DOB: 1932-10-20 Today's Date: 12/11/2023 Time: 1205-1218 SLP Time Calculation (min) (ACUTE ONLY): 13 min  Assessment / Plan / Recommendation Clinical Impression  Chart reviewed, Enusre and Boost added by MD following GOC meeting with pt's son on 12/10/2023. During this session pt presents with potential increased alertness, verbalization but remains oriented to self only. Skilled observation was provided on pt consuming multiple consecutive sips of thin liquids via straw. Pt was free of any overt s/s of aspiration. She politely declined trials of food textures. While this appears to be some improvement in mentation during this session, pt's aspiration risk as well as ability to consume PO textures for nutrition and hydration is likely to fluctuate based on her underlying cognitive/mentation deficits. Would recommend close adherence to aspiration precautions and alertness levels.   At this time, it appears that pt has met maximal benefit from skilled ST services. Will defer to Palliative Care for further medical management and identification for GOC.    HPI HPI: Per H&P, Cassandra Weiss is a 89 y.o. female with medical history significant of atrial fibrillation, hypertension, urge incontinence, hyperlipidemia coronary artery disease, iron deficiency anemia who was brought to the ER with altered mental status and hypotension.  Patient apparently has been less responsive for about 2 days.  Was also found to be hypotensive at home was blood pressure 88/40.  Patient has chronic indwelling catheter.  Patient is currently unable to give any history and no family at bedside.  She was found to have met sepsis criteria with hypothermia temperature 95.6, blood pressure is 93/42 here she has a sodium of 156 hemoglobin 9.3 chloride 115, BUN is 50 creatinine 1.71.  Patient is on warfarin and INR is 8.8 glucose 104.   Acute viral screen is negative.  Analysis positive for UTI.  Patient also had CT renal stone that showed nonobstructive stone otherwise.  Patient is being admitted for sepsis due to UTI CXR No acute cardiopulmonary abnormality. CT Head: Stable chronic small vessel ischemic changes. No acute  intracranial process.  2. Paranasal sinus disease as above.      SLP Plan   (Per chart, MD to offer nutritional supports in form of Ensure and Boost)          Recommendations  Diet recommendations:  (defer to MD and Palliative Care Team) Liquids provided via: Straw Supervision: Full supervision/cueing for compensatory strategies Compensations: Minimize environmental distractions;Slow rate;Small sips/bites Postural Changes and/or Swallow Maneuvers: Seated upright 90 degrees;Upright 30-60 min after meal                  Oral care BID;Oral care before and after PO;Staff/trained caregiver to provide oral care   Frequent or constant Supervision/Assistance Dysphagia, unspecified (R13.10)      (Per chart, MD to offer nutritional supports in form of Ensure and Boost)    Makensie Mulhall B. Rubbie, M.S., CCC-SLP, Tree surgeon Certified Brain Injury Specialist [facility]  [facility] Office [phone] Ascom [phone] Fax [phone]

## 2023-12-11 NOTE — Progress Notes (Signed)
 Peripherally Inserted Central Catheter Placement  The IV Nurse has discussed with the patient and/or persons authorized to consent for the patient, the purpose of this procedure and the potential benefits and risks involved with this procedure.  The benefits include less needle sticks, lab draws from the catheter, and the patient may be discharged home with the catheter. Risks include, but not limited to, infection, bleeding, blood clot (thrombus formation), and puncture of an artery; nerve damage and irregular heartbeat and possibility to perform a PICC exchange if needed/ordered by physician.  Alternatives to this procedure were also discussed.  Bard Power PICC patient education guide, fact sheet on infection prevention and patient information card has been provided to patient /or left at bedside.    PICC Placement Documentation  PICC Double Lumen 12/11/23 Left Brachial 39 cm 0 cm (Active)  Indication for Insertion or Continuance of Line Limited venous access - need for IV therapy >5 days (PICC only);Poor Vasculature-patient has had multiple peripheral attempts or PIVs lasting less than 24 hours 12/11/23 1654  Exposed Catheter (cm) 0 cm 12/11/23 1654  Site Assessment Clean, Dry, Intact 12/11/23 1654  Lumen #1 Status Flushed;Saline locked;Blood return noted 12/11/23 1654  Lumen #2 Status Flushed;Saline locked;Blood return noted 12/11/23 1654  Dressing Type Transparent;Securing device 12/11/23 1654  Dressing Status Antimicrobial disc/dressing in place 12/11/23 1654  Line Care Connections checked and tightened 12/11/23 1654  Line Adjustment (NICU/IV Team Only) No 12/11/23 1654  Dressing Intervention New dressing;Adhesive placed at insertion site (IV team only);Adhesive placed around edges of dressing (IV team/ICU RN only) 12/11/23 1654  Dressing Change Due 12/18/23 12/11/23 1654       Cassandra Weiss 12/11/2023, 4:56 PM

## 2023-12-11 NOTE — Plan of Care (Signed)
  Problem: Fluid Volume: Goal: Hemodynamic stability will improve Outcome: Progressing   Problem: Clinical Measurements: Goal: Diagnostic test results will improve Outcome: Progressing Goal: Signs and symptoms of infection will decrease Outcome: Progressing   Problem: Respiratory: Goal: Ability to maintain adequate ventilation will improve Outcome: Progressing   Problem: Education: Goal: Knowledge of General Education information will improve Description: Including pain rating scale, medication(s)/side effects and non-pharmacologic comfort measures Outcome: Progressing   Problem: Health Behavior/Discharge Planning: Goal: Ability to manage health-related needs will improve Outcome: Progressing   Problem: Clinical Measurements: Goal: Ability to maintain clinical measurements within normal limits will improve Outcome: Progressing Goal: Will remain free from infection Outcome: Progressing Goal: Diagnostic test results will improve Outcome: Progressing Goal: Respiratory complications will improve Outcome: Progressing Goal: Cardiovascular complication will be avoided Outcome: Progressing   Problem: Activity: Goal: Risk for activity intolerance will decrease Outcome: Progressing   Problem: Nutrition: Goal: Adequate nutrition will be maintained Outcome: Progressing   Problem: Coping: Goal: Level of anxiety will decrease Outcome: Progressing   Problem: Elimination: Goal: Will not experience complications related to bowel motility Outcome: Progressing Goal: Will not experience complications related to urinary retention Outcome: Progressing   Problem: Pain Managment: Goal: General experience of comfort will improve and/or be controlled Outcome: Progressing   Problem: Safety: Goal: Ability to remain free from injury will improve Outcome: Progressing   Problem: Skin Integrity: Goal: Risk for impaired skin integrity will decrease Outcome: Progressing   Problem:  Safety: Goal: Non-violent Restraint(s) Outcome: Progressing

## 2023-12-11 NOTE — Progress Notes (Signed)
 Palliative Care Progress Note, Assessment & Plan   Patient Name: Cassandra Weiss       Date: 12/11/2023 DOB: 03/26/1932  Age: 89 y.o. MRN#: [id number] Attending Physician: Caleen Qualia, MD Primary Care Physician: Sadie Manna, MD Admit Date: 12/08/2023  Subjective: Reports feeling a little better today. She denies pain. States she ate some breakfast today. Denies SOB/CP.   HPI: Per previous HPI: Cassandra Weiss is a 89 y.o. female with medical history significant of atrial fibrillation, hypertension, urge incontinence, hyperlipidemia coronary artery disease, iron deficiency anemia who was brought to the ER with altered mental status and hypotension.  Patient apparently has been less responsive for about 2 days.  Was also found to be hypotensive at home was blood pressure 88/40.  Patient has chronic indwelling catheter.  Patient is currently unable to give any history and no family at bedside.  She was found to have met sepsis criteria with hypothermia temperature 95.6, blood pressure is 93/42 here she has a sodium of 156 hemoglobin 9.3 chloride 115, BUN is 50 creatinine 1.71.  Patient is on warfarin and INR is 8.8 glucose 104.  Acute viral screen is negative.  Analysis positive for UTI.  Patient also had CT renal stone that showed nonobstructive stone otherwise.  Patient is being admitted for sepsis due to UTI .    Palliative was consulted to assist with goals of care conversations.    Summary of counseling/coordination of care: Extensive chart review completed prior to meeting patient including labs, vital signs, imaging, progress notes, orders, and available advanced directive documents from current and previous encounters.      Latest Ref Rng & Units 12/11/2023    6:38 AM 12/10/2023   10:23 AM 12/09/2023     5:20 AM  CBC  WBC 4.0 - 10.5 K/uL 8.0  7.8  8.8   Hemoglobin 12.0 - 15.0 g/dL 9.2  7.2  7.9   Hematocrit 36.0 - 46.0 % 28.0  23.7  27.0   Platelets 150 - 400 K/uL 249  242  296       Latest Ref Rng & Units 12/11/2023    6:38 AM 12/10/2023   10:23 AM 12/09/2023    5:20 AM  CMP  Glucose 70 - 99 mg/dL 885  94  99   BUN 8 - 23 mg/dL 20  27  43   Creatinine 0.44 - 1.00 mg/dL 8.82  8.84  8.59   Sodium 135 - 145 mmol/L 144  148  155   Potassium 3.5 - 5.1 mmol/L 3.5  3.1  3.9   Chloride 98 - 111 mmol/L 110  111  120   CO2 22 - 32 mmol/L 26  26  25    Calcium  8.9 - 10.3 mg/dL 8.7  8.5  8.9   Total Protein 6.5 - 8.1 g/dL   6.2   Total Bilirubin 0.0 - 1.2 mg/dL   1.0   Alkaline Phos 38 - 126 U/L   49   AST 15 - 41 U/L   21   ALT 0 - 44 U/L   13     After reviewing the patient's chart and assessing the patient at bedside, I spoke with patient  in regards to symptom management and goals of care.   Elderly, frail, ill-appearing female resting in bed. She is alert and able to engage in minimal conversation, but very difficult to understand. Respirations are even and unlabored. She is in no distress.   Physical Exam Vitals reviewed.  Constitutional:      General: She is not in acute distress.    Appearance: She is ill-appearing.     Comments: Frail  HENT:     Head: Normocephalic and atraumatic.     Mouth/Throat:     Mouth: Mucous membranes are dry.  Pulmonary:     Effort: Pulmonary effort is normal. No respiratory distress.  Musculoskeletal:     Right lower leg: No edema.     Left lower leg: No edema.  Neurological:     Mental Status: She is alert. She is disoriented.     Motor: Weakness present.   Recommendations/Plan: DNR uploaded to Epic via [employer], signed DNR placed in paper chart Continue current supportive interventions Family OK with temp feeding tube, no PEG Palliative will continue to follow peripherally for needs Discharge TBD               Total Time 50 minutes    Time spent includes: Detailed review of medical records (labs, imaging, vital signs), medically appropriate exam (mental status, respiratory, cardiac, skin), discussed with treatment team, counseling and educating patient, family and staff, documenting clinical information, medication management and coordination of care.     Devere Sacks, ELNITA- [facility] Palliative Medicine Team  12/11/2023 9:45 AM  Office [phone]  Pager [phone]

## 2023-12-11 NOTE — Plan of Care (Signed)

## 2023-12-11 NOTE — Consult Note (Addendum)
 PHARMACY - ANTICOAGULATION CONSULT NOTE  Pharmacy Consult for warfarin  Indication: Afib   Allergies  Allergen Reactions   Penicillins Rash    Has patient had a PCN reaction causing immediate rash, facial/tongue/throat swelling, SOB or lightheadedness with hypotension: Unknown Has patient had a PCN reaction causing severe rash involving mucus membranes or skin necrosis: Unknown Has patient had a PCN reaction that required hospitalization: Unknown Has patient had a PCN reaction occurring within the last 10 years: Unknown If all of the above answers are NO, then may proceed with Cephalosporin use.     Patient Measurements:    Vital Signs: Temp: 98.4 F (36.9 C) (09/21 0423) Temp Source: Oral (09/21 0423) BP: 133/71 (09/21 0423) Pulse Rate: 70 (09/21 0423)  Labs: Recent Labs    12/08/23 1515 12/08/23 1717 12/09/23 0520 12/10/23 0639 12/10/23 1023 12/11/23 0638  HGB 9.3*  --  7.9*  --  7.2* 9.2*  HCT 31.1*  --  27.0*  --  23.7* 28.0*  PLT 334  --  296  --  242 249  LABPROT 75.4*  --  83.9* 21.1*  --  31.0*  INR 8.8*  --  10.1* 1.7*  --  2.8*  CREATININE 1.71*  --  1.40*  --  1.15* 1.17*  TROPONINIHS 94* 89*  --   --   --   --     CrCl cannot be calculated (Unknown ideal weight.).   Medical History: Past Medical History:  Diagnosis Date   Arthritis    Atrial fibrillation (HCC)    Hypertension    Urge urinary incontinence     Medications:  Reported home regimen is Warfarin 1 mg po daily with last dose 12/08/23  Assessment: 89 yo female presented to ED due to altered mental status.  INR ED patient found to have supratherapeutic INR.  Pharmacy consulted to manage warfarin dosing.  DDI: levofloxacin  and ancef  (increase INR)  Goal of Therapy:  INR 2-3 Monitor platelets by anticoagulation protocol: Yes   Date INR Warfarin Dose  9/18 8.8 1 mg (PTA)  9/19 10.1 Hold. IV vitamin K  given.   9/20 1.7 1 mg  9/21 2.8      Plan:  INR is therapeutic. Will hold  warfarin due to big jump in INR from yesterday.  Daily INR. CBC at least every 3 days.   Cathaleen GORMAN Blanch, PharmD, BCPS 12/11/2023,10:18 AM

## 2023-12-11 NOTE — Progress Notes (Signed)
 Progress Note   Patient: Cassandra Weiss FMW:[id number] DOB: May 05, 1932 DOA: 12/08/2023     3 DOS: the patient was seen and examined on 12/11/2023   Brief hospital course: Partly taken from H&P.  Cassandra Weiss is a 89 y.o. female with medical history significant of atrial fibrillation, hypertension, urge incontinence, hyperlipidemia coronary artery disease, iron deficiency anemia who was brought to the ER with altered mental status and hypotension.  Patient apparently has been less responsive for about 2 days.  Patient has chronic indwelling catheter.   On presentation patient was hypothermic at 95.6, borderline soft blood pressure, sodium of 156, hemoglobin 9.3, chloride 115, BUN 50, creatinine 1.71, INR of 8.8 on warfarin, respiratory panel negative.  UA concerning for UTI.  CT renal stone shows nonobstructing stone, otherwise unremarkable. CT head was negative for any acute abnormality. CXR with no acute cardiopulmonary abnormalities.  Patient was admitted for concern of sepsis secondary to UTI and started on ceftriaxone .  Blood cultures were drawn.   9/19: Vitals stable, hemoglobin decreased to 7.9 and INR increased to 10.1, no obvious bleeding, giving vitamin K .  Sodium at 155, creatinine with some improvement to 1.40-baseline seems to be around 1.1 Ordered urine culture as add-on.  Preliminary blood culture 1 out of 4 bottles with MSSA. Switching IV fluid to D5 with water deficit of 2.9L. Patient with significant underlying comorbidities, advanced age and poor functional status-consulting palliative care to discuss goals of care.  At baseline patient just walked with the help of walker from bed to her recliner, completely dependent for all ADLs.  Son manages the medications but patient does not take them regularly.  Lives with husband of the similar age, had a caregiver during the day.  9/20: Remained quite lethargic and somnolent, CODE STATUS changed to DNR after palliative care meeting  but family wants maximum medical intervention.  Right shoulder imaging was obtained due to concern of recent concern of joint effusion in July with severe osteoarthritis.  MSSA bacteremia can be due to few smaller skin lesions and injuries, if there is a joint effusion we will ask Ortho to tap to rule out any septic arthritis. Urine cultures growing E. Coli.  INR improved to 1.7, hemoglobin decreased to 7.2, no active bleeding-did 1 unit of PRBC.  Replacing potassium, sodium improved to 148 with improving renal function-continuing D5 with potassium for another day.  9/21: Vital stable, hypernatremia improved, renal function seems stable at 1.17, p.o. intake remained poor, switching IV fluid to LR as maintenance fluid.  Urine cultures with pansensitive E. coli so Levaquin  is being discontinued.  Patient will continue on cefazolin .  Repeat blood cultures pending. PT and OT evaluation ordered   Assessment and Plan: * Sepsis secondary to UTI (HCC) MSSA bacteremia. UA concerning for UTI, urine cultures growing pansensitive E. coli, preliminary blood cultures with  MSSA.  Right shoulder imaging with severe degenerative changes, no effusion.  Multiple small skin lesions likely the source of infection. Hypothermia has been improved. - Ceftriaxone  was switched to cefazolin  -Discontinuing Levaquin  -Repeat blood cultures pending -ID was consulted -Continue supportive care  AKI (acute kidney injury) (HCC) Likely secondary to severe dehydration and poor p.o. intake, creatinine stable at 1.17 which is around baseline -Continue with IV fluid -Monitor renal function -Avoid nephrotoxins  Hypernatremia Resolved. -Switching IV fluid to LR for maintenance as p.o. intake remained poor -monitor sodium  Acute metabolic encephalopathy Patient is currently oriented to name only and not been able to participate with any  meaningful history or care. Concern of underlying advanced dementia. - Delirium  precautions.  Benign essential HTN Blood pressure within goal today - Holding home antihypertensives -Monitor blood pressure  Paroxysmal A-fib (HCC) EKG with A-fib, rate seems controlled. - Continuing carvedilol  - Coumadin  per pharmacy  Hypercoagulable state (HCC) INR improved  s/p vitamin K  -Monitor INR -Coumadin  per pharmacy  Coronary artery disease Unable to explain any chest pain, barely positive troponin with a flat curve. - Continuing home carvedilol  and statin  Iron deficiency anemia Iron deficiency anemia mentioned in her chart, hemoglobin improved to 9.2 s/p 1 unit of PRBC Anemia panel consistent with anemia of chronic disease and mild iron deficiency -Monitor hemoglobin -Transfuse if below 7  Hyperlipidemia - Continue home statin  Urge urinary incontinence Patient had a chronic Foley catheter in place with history of urge urinary incontinence. Foley was exchanged in the ED. - Continue with Foley catheter care  Hypokalemia Potassium of 3.1 with normal magnesium. - Replace potassium and monitor  Subjective: Patient was little more alert when seen today.  P.o. intake remained poor.  Oriented to self only.  Physical Exam: Vitals:   12/10/23 1853 12/10/23 2026 12/11/23 0423 12/11/23 1101  BP: (!) 121/53 128/61 133/71   Pulse: 75 88 70   Resp: 18 20 20    Temp: (!) 97.5 F (36.4 C) 98 F (36.7 C) 98.4 F (36.9 C)   TempSrc: Axillary Oral Oral   SpO2: 98% 100% 100%   Weight:    59.7 kg   General.  Frail and malnourished elderly lady, in no acute distress. Pulmonary.  Lungs clear bilaterally, normal respiratory effort. CV.  Regular rate and rhythm, no JVD, rub or murmur. Abdomen.  Soft, nontender, nondistended, BS positive. CNS.  Alert and oriented .  No focal neurologic deficit. Extremities.  No edema, no cyanosis, pulses intact and symmetrical.   Data Reviewed: Prior data reviewed.  Family Communication: Discussed with son at  bedside  Disposition: Status is: Inpatient Remains inpatient appropriate because: Severity of illness  Planned Discharge Destination: To be determined  DVT prophylaxis.  Coumadin -currently on hold due to supratherapeutic INR Time spent: 50 minutes  This record has been created using Conservation officer, historic buildings. Errors have been sought and corrected,but may not always be located. Such creation errors do not reflect on the standard of care.   Author: Amaryllis Dare, MD 12/11/2023 1:32 PM  For on call review [email or web address].

## 2023-12-12 ENCOUNTER — Inpatient Hospital Stay
Admit: 2023-12-12 | Discharge: 2023-12-12 | Disposition: A | Discharge status: Home / Self Care | Attending: Infectious Diseases | Admitting: Infectious Diseases

## 2023-12-12 DIAGNOSIS — E870 Hyperosmolality and hypernatremia: Secondary | ICD-10-CM | POA: Diagnosis not present

## 2023-12-12 DIAGNOSIS — N390 Urinary tract infection, site not specified: Secondary | ICD-10-CM | POA: Diagnosis not present

## 2023-12-12 DIAGNOSIS — N179 Acute kidney failure, unspecified: Secondary | ICD-10-CM | POA: Diagnosis not present

## 2023-12-12 DIAGNOSIS — D649 Anemia, unspecified: Secondary | ICD-10-CM

## 2023-12-12 DIAGNOSIS — Z515 Encounter for palliative care: Secondary | ICD-10-CM | POA: Diagnosis not present

## 2023-12-12 DIAGNOSIS — R4182 Altered mental status, unspecified: Secondary | ICD-10-CM | POA: Diagnosis not present

## 2023-12-12 DIAGNOSIS — R7881 Bacteremia: Secondary | ICD-10-CM | POA: Diagnosis not present

## 2023-12-12 LAB — ECHOCARDIOGRAM COMPLETE
S' Lateral: 2 cm
Weight: 2105.83 [oz_av]

## 2023-12-12 LAB — PROTIME-INR
INR: 3.3 — ABNORMAL HIGH (ref 0.8–1.2)
Prothrombin Time: 35.4 s — ABNORMAL HIGH (ref 11.4–15.2)

## 2023-12-12 MED ORDER — HYDRALAZINE HCL 50 MG PO TABS
50.0000 mg | ORAL_TABLET | Freq: Three times a day (TID) | ORAL | Status: DC
  Administered 2023-12-12 – 2023-12-19 (×13): 50 mg via ORAL
  Filled 2023-12-12 (×17): qty 1

## 2023-12-12 MED ORDER — LACTATED RINGERS IV SOLN
INTRAVENOUS | Status: AC
Start: 2023-12-12 — End: 2023-12-13

## 2023-12-12 MED ORDER — VITAMIN C 500 MG PO TABS
500.0000 mg | ORAL_TABLET | Freq: Two times a day (BID) | ORAL | Status: DC
  Administered 2023-12-12 – 2023-12-19 (×10): 500 mg via ORAL
  Filled 2023-12-12 (×13): qty 1

## 2023-12-12 MED ORDER — ZINC SULFATE 220 (50 ZN) MG PO CAPS
220.0000 mg | ORAL_CAPSULE | Freq: Every day | ORAL | Status: DC
  Administered 2023-12-12 – 2023-12-19 (×6): 220 mg via ORAL
  Filled 2023-12-12 (×8): qty 1

## 2023-12-12 MED ORDER — AMLODIPINE BESYLATE 5 MG PO TABS
5.0000 mg | ORAL_TABLET | Freq: Every day | ORAL | Status: DC
  Administered 2023-12-12 – 2023-12-19 (×6): 5 mg via ORAL
  Filled 2023-12-12 (×8): qty 1

## 2023-12-12 NOTE — Progress Notes (Signed)
 Palliative Care Progress Note, Assessment & Plan   Patient Name: Cassandra Weiss       Date: 12/12/2023 DOB: 10-Dec-1932  Age: 89 y.o. MRN#: [id number] Attending Physician: Caleen Qualia, MD Primary Care Physician: Sadie Manna, MD Admit Date: 12/08/2023  Subjective: Patient is out of bed and sitting in chair.  She is awake, alert, able to acknowledge my presence, and able to make her wishes known.  No family or friends present during my visit.  HPI: Cassandra Weiss is a 89 y.o. female with medical history significant of atrial fibrillation, hypertension, urge incontinence, hyperlipidemia coronary artery disease, iron deficiency anemia who was brought to the ER with altered mental status and hypotension.  Patient apparently has been less responsive for about 2 days.  Was also found to be hypotensive at home was blood pressure 88/40.  Patient has chronic indwelling catheter.    She was found to have met sepsis criteria with hypothermia temperature 95.6, blood pressure is 93/42 here she has a sodium of 156 hemoglobin 9.3 chloride 115, BUN is 50 creatinine 1.71.  Patient is on warfarin and INR is 8.8 glucose 104.  Acute viral screen is negative.  Analysis positive for UTI.  Patient also had CT renal stone that showed nonobstructive stone otherwise.  Patient is being admitted for sepsis due to UTI.  PMT was consulted to support patient and family with goals of care discussions.  Summary of counseling/coordination of care: Extensive chart review completed prior to meeting patient including labs, vital signs, imaging, progress notes, orders, and available advanced directive documents from current and previous encounters.   After reviewing the patient's chart and assessing the patient at bedside, I spoke with patient  in regards to symptom management and goals of care.   Symptoms assessed.  Patient has no acute complaints at this time.  She denies pain, constipation, N/B, headache, and other acute ailments at this time.  No adjustment to [facility] needed.  I attempted to gauge patient's understanding of her current medical situation.  She smiled at me pleasantly but had no response to this line of questions.  Instead, she asked if I could help call her son will not.  With phone at bedside, we attempted to speak with [place].  No answer.  She did not wish to leave a voicemail.  Again, I attempted to redirect patient and a discussion about her current medical situation.  She shares she is feeling fine and was appreciative of my visit.  She is unable to fully engage in goals of care and medical decision making independently at this time.  When asked if patient had eaten breakfast, she said that she ate a lot.  She endorses she does not have quite the appetite that she used to.  However, she says she is doing my best.  Discussed significance of nutrition as a significant contributor to patient's overall prognosis.  She shares she has never had much of an appetite lately and is eating and drinking more than she did before she came into the hospital.  No change to plan of care at this time.  Plan remains to continue with supportive measures and see if patient's p.o. intake has improved,  also being medically optimized.  PMT will continue to follow and support  Physical Exam Vitals reviewed.  Constitutional:      General: She is not in acute distress.    Appearance: She is normal weight.  HENT:     Head: Normocephalic.     Mouth/Throat:     Mouth: Mucous membranes are moist.     Comments: Poor dentition Eyes:     Pupils: Pupils are equal, round, and reactive to light.  Pulmonary:     Effort: Pulmonary effort is normal.  Abdominal:     Palpations: Abdomen is soft.  Skin:    General: Skin is warm and dry.   Neurological:     Mental Status: She is alert.     Comments: Oriented X1  Psychiatric:        Mood and Affect: Mood normal.        Behavior: Behavior normal.             Visit includes: Detailed review of medical records (labs, imaging, vital signs), medically appropriate exam (mental status, respiratory, cardiac, skin), discussed with treatment team, counseling and educating patient, family and staff, documenting clinical information, medication management and coordination of care.  Cassandra L. Arvid, DNP, FNP-BC Palliative Medicine Team

## 2023-12-12 NOTE — Consult Note (Addendum)
 [facility] CLINIC CARDIOLOGY CONSULT NOTE       Patient ID: Cassandra Weiss MRN: [id number] DOB/AGE: 1933/01/13 89 y.o.  Admit date: 12/08/2023 Referring Physician Dr. Amaryllis Dare Primary Physician Sadie Manna, MD  Primary Cardiologist Dr. Bosie (last seen 2019) Reason for Consultation possible endocarditis  HPI: Cassandra Weiss is a 89 y.o. female  with a past medical history of persistent atrial fibrillation, hx CVA, hypertension, IDA, cognitive impairment who presented to the ED on 12/08/2023 for worsening altered mental status and hypotension. Cardiology was consulted for further evaluation.   Portions of history obtained via chart review and patient's husband due to her baseline cognitive impairment.  She was initially brought to the ED for further evaluation due to worsening mental status and hypotension.  Workup in the ED notable for creatinine 1.71, potassium 4.1, hemoglobin 9.3, WBC 8.5 INR 8.8. Troponins 94 > 89. EKG in the ED atrial fibrillation rate 92 bpm.  UA nitrite positive with many bacteria, she was started on IV antibiotics in the ED.  Blood cultures positive for MSSA.  Echo done today revealed preserved EF but echogenicity noted on the ventricular side of aortic valve.  At the time of my evaluation, patient is resting comfortably in bedside chair with husband present at bedside.  She states that overall she is feeling relatively well today, she denies any cardiac complaints.  Her echocardiogram results were discussed in detail.  Explained to patient and her husband the indication for TEE for additional evaluation.  They both expressed understanding and are amenable to proceeding.  Consent will likely need to be signed by patient's husband due to her baseline mental status.  He expressed that he and her son are her surrogate decision makers.  Review of systems complete and found to be negative unless listed above    Past Medical History:  Diagnosis Date   Arthritis     Atrial fibrillation (HCC)    Hypertension    Urge urinary incontinence     No past surgical history on file.  Medications Prior to Admission  Medication Sig Dispense Refill Last Dose/Taking   acetaminophen  (TYLENOL ) 325 MG tablet Take 2 tablets (650 mg total) by mouth every 6 (six) hours as needed for mild pain (pain score 1-3) or fever (or Fever >/= 101). 20 tablet 0 Unknown   amLODipine  (NORVASC ) 5 MG tablet Take 5 mg by mouth daily.   12/08/2023 Morning   atorvastatin  (LIPITOR) 80 MG tablet Take 80 mg by mouth daily.   12/08/2023 Morning   Calcium  Alginate-Silver  (RESTORE SILVER  DRESSING) 2X2 PADS Apply 1 Application topically daily.   12/08/2023 Morning   carvedilol  (COREG ) 25 MG tablet Take 25 mg by mouth 2 (two) times daily.   12/08/2023 Morning   furosemide (LASIX) 40 MG tablet Take 40 mg by mouth daily.   12/08/2023 Morning   gabapentin  (NEURONTIN ) 300 MG capsule Take by mouth.  1 (one) Capsule Capsule three times a day   12/08/2023 Morning   Multiple Vitamin (MULTI-VITAMIN) tablet Take 1 tablet by mouth daily.   12/08/2023 Morning   warfarin (COUMADIN ) 1 MG tablet Take 1 mg by mouth daily.   12/08/2023 Morning   ALPRAZolam  (XANAX ) 0.5 MG tablet Take 0.5 mg by mouth at bedtime as needed for sleep.   Unknown   hydrALAZINE  (APRESOLINE ) 50 MG tablet Take 50 mg by mouth 3 (three) times daily.   Unknown   linezolid (ZYVOX) 600 MG tablet Take 600 mg by mouth 2 (two) times daily. (Patient  not taking: Reported on 12/08/2023)   Not Taking   Social History   Socioeconomic History   Marital status: Married    Spouse name: Not on file   Number of children: Not on file   Years of education: Not on file   Highest education level: Not on file  Occupational History   Not on file  Tobacco Use   Smoking status: Never   Smokeless tobacco: Never  Vaping Use   Vaping status: Never Used  Substance and Sexual Activity   Alcohol  use: No   Drug use: Never   Sexual activity: Not on file  Other Topics  Concern   Not on file  Social History Narrative   Lives with husband at home   Social Drivers of Health   Financial Resource Strain: Low Risk  (08/17/2023)   Received from [facility]   Overall Financial Resource Strain (CARDIA)    Difficulty of Paying Living Expenses: Not hard at all  Food Insecurity: No Food Insecurity (12/09/2023)   Hunger Vital Sign    Worried About Running Out of Food in the Last Year: Never true    Ran Out of Food in the Last Year: Never true  Transportation Needs: No Transportation Needs (12/09/2023)   PRAPARE - Administrator, Civil Service (Medical): No    Lack of Transportation (Non-Medical): No  Physical Activity: Insufficiently Active (01/13/2018)   Exercise Vital Sign    Days of Exercise per Week: 2 days    Minutes of Exercise per Session: 10 min  Stress: No Stress Concern Present (01/13/2018)   [employer] of Occupational Health - Occupational Stress Questionnaire    Feeling of Stress : Not at all  Social Connections: Moderately Integrated (12/09/2023)   Social Connection and Isolation Panel    Frequency of Communication with Friends and Family: Once a week    Frequency of Social Gatherings with Friends and Family: Once a week    Attends Religious Services: More than 4 times per year    Active Member of [employer] or Organizations: Yes    Attends Banker Meetings: More than 4 times per year    Marital Status: Married  Catering manager Violence: Patient Unable To Answer (12/09/2023)   Humiliation, Afraid, Rape, and Kick questionnaire    Fear of Current or Ex-Partner: Patient unable to answer    Emotionally Abused: Patient unable to answer    Physically Abused: Patient unable to answer    Sexually Abused: Patient unable to answer    Family History  Problem Relation Age of Onset   Heart disease Mother    Stroke Mother    Heart disease Father      Vitals:   12/11/23 1522 12/11/23 2100 12/12/23 0406  12/12/23 0929  BP: (!) 140/70 (!) 147/79 (!) 140/75 (!) 161/71  Pulse: 82 86 69 (!) 56  Resp: 16 16 20 16   Temp: 98.2 F (36.8 C) 98.2 F (36.8 C) 98 F (36.7 C) 97.8 F (36.6 C)  TempSrc: Oral     SpO2: 100% 100% 99% (!) 85%  Weight:        PHYSICAL EXAM General: Chronically ill-appearing elderly female, well nourished, in no acute distress. HEENT: Normocephalic and atraumatic. Neck: No JVD.  Lungs: Normal respiratory effort on room air. Clear bilaterally to auscultation. No wheezes, crackles, rhonchi.  Heart: Irregularly irregular, controlled rate. Normal S1 and S2 without gallops or murmurs.  Abdomen: Non-distended appearing.  Msk: Normal strength and  tone for age. Extremities: Warm and well perfused. No clubbing, cyanosis.  No edema.  Neuro: Alert and oriented X 1. Psych: Answers questions appropriately.   Labs: Basic Metabolic Panel: Recent Labs    12/10/23 1023 12/11/23 0638  NA 148* 144  K 3.1* 3.5  CL 111 110  CO2 26 26  GLUCOSE 94 114*  BUN 27* 20  CREATININE 1.15* 1.17*  CALCIUM  8.5* 8.7*  MG 2.1  --   PHOS  --  2.3*   Liver Function Tests: Recent Labs    12/11/23 0638  ALBUMIN 2.2*   No results for input(s): LIPASE, AMYLASE in the last 72 hours. CBC: Recent Labs    12/10/23 1023 12/11/23 0638  WBC 7.8 8.0  HGB 7.2* 9.2*  HCT 23.7* 28.0*  MCV 88.8 86.7  PLT 242 249   Cardiac Enzymes: No results for input(s): CKTOTAL, CKMB, CKMBINDEX, TROPONINIHS in the last 72 hours. BNP: No results for input(s): BNP in the last 72 hours. D-Dimer: No results for input(s): DDIMER in the last 72 hours. Hemoglobin A1C: No results for input(s): HGBA1C in the last 72 hours. Fasting Lipid Panel: No results for input(s): CHOL, HDL, LDLCALC, TRIG, CHOLHDL, LDLDIRECT in the last 72 hours. Thyroid  Function Tests: No results for input(s): TSH, T4TOTAL, T3FREE, THYROIDAB in the last 72 hours.  Invalid input(s):  FREET3 Anemia Panel: Recent Labs    12/10/23 0639  VITAMINB12 1,240*     Radiology: ECHOCARDIOGRAM COMPLETE Result Date: 12/12/2023    ECHOCARDIOGRAM REPORT   Patient Name:   Cassandra Weiss Date of Exam: 12/12/2023 Medical Rec #:  [id number]     Height:       62.0 in Accession #:    [id number]    Weight:       131.6 lb Date of Birth:  October 21, 1932     BSA:          1.600 m Patient Age:    89 years      BP:           161/71 mmHg Patient Gender: F             HR:           56 bpm. Exam Location:  [facility] Procedure: 2D Echo, Cardiac Doppler and Color Doppler (Both Spectral and Color            Flow Doppler were utilized during procedure). Indications:     Bacteremia R78.81  History:         Patient has no prior history of Echocardiogram examinations.                  Arrythmias:Atrial Fibrillation; Risk Factors:Hypertension.  Sonographer:     Christopher Furnace Referring Phys:  [id number] DONALD BERLIN Diagnosing Phys: Keller Paterson  Sonographer Comments: Technically challenging study due to limited acoustic windows and no apical window. IMPRESSIONS  1. Technically difficult study with no apical views.  2. Left ventricular ejection fraction, by estimation, is 60 to 65%. The left ventricle has normal function. Left ventricular endocardial border not optimally defined to evaluate regional wall motion. 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 Left ventricular diastolic parameters are indeterminate.  3. Right ventricular systolic function was not well visualized. The right ventricular size is not well visualized.  4. The mitral valve is normal in structure. Trivial mitral valve regurgitation.  5. Echogenicity (0.5 cm X 0.5 cm) noted on ventricular aspect of aortic valve which can suggest  vegetation vs degenerative change. Recommend TEE for further evaluation as clinically indicated. The aortic valve is tricuspid. Aortic valve regurgitation is  not visualized. Aortic valve  sclerosis/calcification is present, without any evidence of aortic stenosis.  6. The inferior vena cava is normal in size with greater than 50% respiratory variability, suggesting right atrial pressure of 3 mmHg. FINDINGS  Left Ventricle: Left ventricular ejection fraction, by estimation, is 60 to 65%. The left ventricle has normal function. Left ventricular endocardial border not optimally defined to evaluate regional wall motion. The left ventricular internal cavity size was normal in size. 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 Left ventricular diastolic parameters are indeterminate. Right Ventricle: The right ventricular size is not well visualized. Right vetricular wall thickness was not well visualized. Right ventricular systolic function was not well visualized. Left Atrium: Left atrial size was not well visualized. Right Atrium: Right atrial size was not well visualized. Pericardium: There is no evidence of pericardial effusion. Mitral Valve: The mitral valve is normal in structure. Mild mitral annular calcification. Trivial mitral valve regurgitation. Tricuspid Valve: The tricuspid valve is normal in structure. Tricuspid valve regurgitation is mild. Aortic Valve: Echogenicity (0.5 cm X 0.5 cm) noted on ventricular aspect of aortic valve which can suggest vegetation vs degenerative change. Recommend TEE for further evaluation as clinically indicated. The aortic valve is tricuspid. Aortic valve regurgitation is not visualized. Aortic valve sclerosis/calcification is present, without any evidence of aortic stenosis. Pulmonic Valve: The pulmonic valve was not well visualized. Pulmonic valve regurgitation is trivial. Aorta: The aortic root is normal in size and structure. Venous: The inferior vena cava is normal in size with greater than 50% respiratory variability, suggesting right atrial pressure of 3 mmHg. IAS/Shunts: The interatrial septum was not well visualized.   LEFT VENTRICLE PLAX 2D LVIDd:         3.00 cm LVIDs:         2.00 cm LV PW:         1.30 cm LV IVS:        1.80 cm LVOT diam:     2.00 cm LVOT Area:     3.14 cm  LEFT ATRIUM         Index LA diam:    4.80 cm 3.00 cm/m   AORTA Ao Root diam: 3.30 cm TRICUSPID VALVE TR Peak grad:   28.1 mmHg TR Vmax:        265.00 cm/s  SHUNTS Systemic Diam: 2.00 cm Keller Paterson Electronically signed by Keller Paterson Signature Date/Time: 12/12/2023/12:47:21 PM    Final    DG Chest Port 1 View Result Date: 12/11/2023 CLINICAL DATA:  PICC line placement EXAM: PORTABLE CHEST 1 VIEW COMPARISON:  12/11/2023 FINDINGS: Left PICC line in place with the tip in the SVC. Cardiomegaly, vascular congestion. Interstitial prominence may reflect interstitial edema. No effusions. IMPRESSION: Left PICC line tip in the SVC. Cardiomegaly with vascular congestion and suspected early interstitial edema. Electronically Signed   By: Franky Crease M.D.   On: 12/11/2023 17:10   DG Chest Port 1 View Result Date: 12/11/2023 CLINICAL DATA:  PICC line placement. EXAM: PORTABLE CHEST 1 VIEW COMPARISON:  12/08/2023 FINDINGS: The cardio pericardial silhouette is enlarged. There is pulmonary vascular congestion without overt pulmonary edema. No pleural effusion or pneumothorax. Left PICC line tip overlies the region of the innominate vein confluence. Bones are diffusely demineralized with degenerative changes noted in both shoulders. IMPRESSION: Left PICC line tip overlies the region of  the innominate vein confluence. Electronically Signed   By: Camellia Candle M.D.   On: 12/11/2023 08:56   DG Shoulder Right Result Date: 12/10/2023 CLINICAL DATA:  Pain EXAM: RIGHT SHOULDER - 2+ VIEW COMPARISON:  Right shoulder x-ray 10/04/2023. FINDINGS: The bones are osteopenic. There is no acute fracture or dislocation identified. There severe degenerative changes of the glenohumeral joint with marked joint space narrowing and exuberant osteophyte formation. Findings have  progressed compared to prior study. Moderate degenerative changes of the acromioclavicular joint appear similar to the prior study. Soft tissues are within normal limits. IMPRESSION: 1. Progression of severe degenerative changes of the glenohumeral joint. 2. No acute fracture. Electronically Signed   By: Greig Pique M.D.   On: 12/10/2023 18:24   US  EKG SITE RITE Result Date: 12/10/2023 If Site Rite image not attached, placement could not be confirmed due to current cardiac rhythm.  CT Renal Stone Study Result Date: 12/08/2023 CLINICAL DATA:  Abdominal/flank pain, stone suspected EXAM: CT ABDOMEN AND PELVIS WITHOUT CONTRAST TECHNIQUE: Multidetector CT imaging of the abdomen and pelvis was performed following the standard protocol without IV contrast. RADIATION DOSE REDUCTION: This exam was performed according to the departmental dose-optimization program which includes automated exposure control, adjustment of the mA and/or kV according to patient size and/or use of iterative reconstruction technique. COMPARISON:  None Available. FINDINGS: Lower chest: Cardiomegaly. Severe atherosclerotic plaque. Trace to small left and trace right pleural effusions. Small hiatal hernia. Hepatobiliary: No focal liver abnormality. No gallstones, gallbladder wall thickening, or pericholecystic fluid. No biliary dilatation. Pancreas: Diffusely atrophic. No focal lesion. Otherwise normal pancreatic contour. No surrounding inflammatory changes. No main pancreatic ductal dilatation. Spleen: Normal in size without focal abnormality. Adrenals/Urinary Tract: No adrenal nodule bilaterally. Fluid density lesion of the right kidney likely represents simple renal cyst. Bilateral high density lesions within the kidneys likely represent proteinaceous or hemorrhagic cysts. There is 4 mm left nephrolithiasis. The urinary bladder is decompressed with Foley catheter tip and balloon terminating within its lumen. Stomach/Bowel: Stomach is within  normal limits. No evidence of bowel wall thickening or dilatation. The appendix is not definitely identified with no inflammatory changes in the right lower quadrant to suggest acute appendicitis. Vascular/Lymphatic: No abdominal aorta or iliac aneurysm. Severe atherosclerotic plaque of the aorta and its branches. No abdominal, pelvic, or inguinal lymphadenopathy. Reproductive: Status post hysterectomy. No adnexal masses. Other: No intraperitoneal free fluid. No intraperitoneal free gas. No organized fluid collection. Musculoskeletal: Tiny fat containing umbilical hernia. No suspicious lytic or blastic osseous lesions. No acute displaced fracture. Marked L5-S1 disc bulge. IMPRESSION: 1. Trace to small left and trace right pleural effusions. 2. Cardiomegaly. 3. Small hiatal hernia. 4. Nonobstructive 4 mm left nephrolithiasis. 5. Marked L5-S1 disc bulge. 6. Otherwise limited evaluation on this noncontrast study. 7.  Aortic Atherosclerosis (ICD10-I70.0). Electronically Signed   By: Morgane  Naveau M.D.   On: 12/08/2023 17:56   CT HEAD WO CONTRAST ( ) Result Date: 12/08/2023 CLINICAL DATA:  Altered level of consciousness, hypotension EXAM: CT HEAD WITHOUT CONTRAST TECHNIQUE: Contiguous axial images were obtained from the base of the skull through the vertex without intravenous contrast. RADIATION DOSE REDUCTION: This exam was performed according to the departmental dose-optimization program which includes automated exposure control, adjustment of the mA and/or kV according to patient size and/or use of iterative reconstruction technique. COMPARISON:  10/04/2023 FINDINGS: Brain: Stable hypodensities within the periventricular white matter and basal ganglia, compatible with chronic small vessel ischemic changes. No evidence of acute infarct or hemorrhage.  Lateral ventricles and midline structures are unremarkable. No acute extra-axial fluid collections. No mass effect. Vascular: No hyperdense vessel or unexpected  calcification. Skull: Normal. Negative for fracture or focal lesion. Sinuses/Orbits: Opacification of the left sphenoid sinus. Polypoid mucosal thickening right maxillary sinus. No gas fluid levels. Other: None. IMPRESSION: 1. Stable chronic small vessel ischemic changes. No acute intracranial process. 2. Paranasal sinus disease as above. Electronically Signed   By: Ozell Daring M.D.   On: 12/08/2023 17:47   DG Chest Portable 1 View Result Date: 12/08/2023 CLINICAL DATA:  AMS EXAM: PORTABLE CHEST - 1 VIEW COMPARISON:  10/04/2023 FINDINGS: No focal airspace consolidation, pleural effusion, or pneumothorax. Moderate cardiomegaly. Tortuous aorta with aortic atherosclerosis. No acute fracture or destructive lesions. Multilevel thoracic osteophytosis. Advanced degenerative changes of both shoulders. IMPRESSION: No acute cardiopulmonary abnormality. Electronically Signed   By: Rogelia Myers M.D.   On: 12/08/2023 15:34    ECHO as above  TELEMETRY reviewed by me 12/12/2023: not on tele  EKG reviewed by me: atrial fibrillation rate 92 bpm  Data reviewed by me 12/12/2023: last 24h vitals tele labs imaging I/O ED provider note, admission H&P, hospitalist progress note  Principal Problem:   Sepsis secondary to UTI (HCC) Active Problems:   Altered mental status   AKI (acute kidney injury)   AMS (altered mental status)   Acute urinary tract infection   Benign essential HTN   Coronary artery disease   Hyperlipidemia   Paroxysmal A-fib (HCC)   Iron deficiency anemia   Hypernatremia   MSSA bacteremia   Acute metabolic encephalopathy   Hypercoagulable state   Urge urinary incontinence   Hypokalemia    ASSESSMENT AND PLAN:  Cassandra Weiss is a 89 y.o. female  with a past medical history of persistent atrial fibrillation, hx CVA, hypertension, IDA, cognitive impairment who presented to the ED on 12/08/2023 for worsening altered mental status and hypotension. Cardiology was consulted for further  evaluation.   # MSSA bacteremia # Urosepsis, AKI # Abnormal echocardiogram # Persistent atrial fibrillation # Hypertension # Hyperlipidemia Patient was brought to the ED due to concerns for altered mental status and hypotension.  Has a chronic indwelling Foley catheter and was found to have UTI and sepsis.  Blood cultures positive for MSSA.  Echo today with EF 60-65%, severe asymmetric LVH, echogenicity 0.5 cm x 0.5 cm noted on ventricular aspect of aortic valve (vegetation versus degenerative change). -Plan for TEE tomorrow for further evaluation of possible endocarditis. NPO at midnight.  Consent will need to be signed by patient's husband due to her baseline cognitive impairment. -Warfarin as per pharmacy.  INR supratherapeutic today at 3.3.  Will monitor closely, would not proceed with TEE if INR greater than 3.8. -Further management of UTI, sepsis, bacteremia as per primary team. -Continue amlodipine  10 mg daily, carvedilol  25 mg twice daily, hydralazine  50 mg 3 times daily. -Continue atorvastatin  80 mg daily.   This patient's plan of care was discussed and created with Dr. Wilburn and he is in agreement.  Signed: Danita Bloch, PA-C  12/12/2023, 4:13 PM [facility]

## 2023-12-12 NOTE — Consult Note (Signed)
 PHARMACY - ANTICOAGULATION CONSULT NOTE  Pharmacy Consult for warfarin  Indication: Afib   Allergies  Allergen Reactions   Penicillins Rash    Has patient had a PCN reaction causing immediate rash, facial/tongue/throat swelling, SOB or lightheadedness with hypotension: Unknown Has patient had a PCN reaction causing severe rash involving mucus membranes or skin necrosis: Unknown Has patient had a PCN reaction that required hospitalization: Unknown Has patient had a PCN reaction occurring within the last 10 years: Unknown If all of the above answers are NO, then may proceed with Cephalosporin use.     Patient Measurements: Weight: 59.7 kg (131 lb 9.8 oz)  Vital Signs: Temp: 98 F (36.7 C) (09/22 0406) BP: 140/75 (09/22 0406) Pulse Rate: 69 (09/22 0406)  Labs: Recent Labs    12/10/23 0639 12/10/23 1023 12/11/23 0638 12/12/23 0504  HGB  --  7.2* 9.2*  --   HCT  --  23.7* 28.0*  --   PLT  --  242 249  --   LABPROT 21.1*  --  31.0* 35.4*  INR 1.7*  --  2.8* 3.3*  CREATININE  --  1.15* 1.17*  --     Estimated Creatinine Clearance: 25.8 mL/min (A) (by C-G formula based on SCr of 1.17 mg/dL (H)).   Medical History: Past Medical History:  Diagnosis Date   Arthritis    Atrial fibrillation (HCC)    Hypertension    Urge urinary incontinence     Medications:  Reported home regimen is Warfarin 1 mg po daily with last dose 12/08/23  Assessment: 89 yo female presented to ED due to altered mental status.  In ED patient found to have supratherapeutic INR.  Pharmacy consulted to manage warfarin dosing.  DDI: ancef  (increase INR)  Goal of Therapy:  INR 2-3 Monitor platelets by anticoagulation protocol: Yes   Date INR Warfarin Dose  9/18 8.8 1 mg (PTA)  9/19 10.1 Hold. IV vitamin K  given.   9/20 1.7 1 mg  9/21 2.8 Hold  9/22 3.3 Hold     Plan:  INR is supratherapeutic at 3.3 Hold Warfarin dose Daily INR while inpatient and CBC at least every 72 hours  Kayla JULIANNA Blew, PharmD, BCPS 12/12/2023,7:31 AM

## 2023-12-12 NOTE — Progress Notes (Signed)
 Progress Note   Patient: Cassandra Weiss FMW:[id number] DOB: [birth year] DOA: 12/08/2023     4 DOS: the patient was seen and examined on 12/12/2023   Brief hospital course: Partly taken from H&P.  Cassandra Weiss is a 89 y.o. female with medical history significant of atrial fibrillation, hypertension, urge incontinence, hyperlipidemia coronary artery disease, iron deficiency anemia who was brought to the ER with altered mental status and hypotension.  Patient apparently has been less responsive for about 2 days.  Patient has chronic indwelling catheter.   On presentation patient was hypothermic at 95.6, borderline soft blood pressure, sodium of 156, hemoglobin 9.3, chloride 115, BUN 50, creatinine 1.71, INR of 8.8 on warfarin, respiratory panel negative.  UA concerning for UTI.  CT renal stone shows nonobstructing stone, otherwise unremarkable. CT head was negative for any acute abnormality. CXR with no acute cardiopulmonary abnormalities.  Patient was admitted for concern of sepsis secondary to UTI and started on ceftriaxone .  Blood cultures were drawn.   9/19: Vitals stable, hemoglobin decreased to 7.9 and INR increased to 10.1, no obvious bleeding, giving vitamin K .  Sodium at 155, creatinine with some improvement to 1.40-baseline seems to be around 1.1 Ordered urine culture as add-on.  Preliminary blood culture 1 out of 4 bottles with MSSA. Switching IV fluid to D5 with water deficit of 2.9L. Patient with significant underlying comorbidities, advanced age and poor functional status-consulting palliative care to discuss goals of care.  At baseline patient just walked with the help of walker from bed to her recliner, completely dependent for all ADLs.  Son manages the medications but patient does not take them regularly.  Lives with husband of the similar age, had a caregiver during the day.  9/20: Remained quite lethargic and somnolent, CODE STATUS changed to DNR after palliative care meeting  but family wants maximum medical intervention.  Right shoulder imaging was obtained due to concern of recent concern of joint effusion in July with severe osteoarthritis.  MSSA bacteremia can be due to few smaller skin lesions and injuries, if there is a joint effusion we will ask Ortho to tap to rule out any septic arthritis. Urine cultures growing E. Coli.  INR improved to 1.7, hemoglobin decreased to 7.2, no active bleeding-did 1 unit of PRBC.  Replacing potassium, sodium improved to 148 with improving renal function-continuing D5 with potassium for another day.  9/21: Vital stable, hypernatremia improved, renal function seems stable at 1.17, p.o. intake remained poor, switching IV fluid to LR as maintenance fluid.  Urine cultures with pansensitive E. coli so Levaquin  is being discontinued.  Patient will continue on cefazolin .  Repeat blood cultures pending. PT and OT evaluation ordered.  9/22: Hemodynamically remained stable, repeat blood cultures negative in 24 hours.  Echocardiogram with normal EF, asymmetric left LV hypertrophy, there was an echogenicity noted on aortic valve which could be due to vegetation versus degenerative changes with recommendations to do TEE for further evaluation. [facility] cardiology consulted for TEE. PT recommending SNF   Assessment and Plan: * Sepsis secondary to UTI (HCC) MSSA bacteremia. UA concerning for UTI, urine cultures growing pansensitive E. coli, preliminary blood cultures with  MSSA.  Right shoulder imaging with severe degenerative changes, no effusion.  Multiple small skin lesions likely the source of infection. Hypothermia has been improved. Echocardiogram with concern of an echogenic lesion on aortic valve-need TEE for further evaluation and to rule out endocarditis - Ceftriaxone  was switched to cefazolin  -Discontinuing Levaquin  -Repeat blood cultures negative in  24-hour. -ID was consulted -Continue supportive care  AKI (acute kidney injury)  (HCC) Likely secondary to severe dehydration and poor p.o. intake, creatinine stable at 1.17 which is around baseline -Continue with IV fluid -Monitor renal function -Avoid nephrotoxins  Hypernatremia Resolved. -Switching IV fluid to LR for maintenance as p.o. intake remained poor -monitor sodium  Acute metabolic encephalopathy Patient is currently oriented to name only and not been able to participate with any meaningful history or care. Concern of underlying advanced dementia. - Delirium precautions.  Benign essential HTN Blood pressure now elevated. -Restarting home amlodipine  and hydralazine  -Monitor blood pressure  Paroxysmal A-fib (HCC) EKG with A-fib, rate seems controlled. - Continuing carvedilol  - Coumadin  per pharmacy  Hypercoagulable state INR improved  s/p vitamin K  -Monitor INR -Coumadin  per pharmacy  Coronary artery disease Unable to explain any chest pain, barely positive troponin with a flat curve. - Continuing home carvedilol  and statin  Iron deficiency anemia Iron deficiency anemia mentioned in her chart, hemoglobin improved to 9.2 s/p 1 unit of PRBC Anemia panel consistent with anemia of chronic disease and mild iron deficiency -Monitor hemoglobin -Transfuse if below 7  Hyperlipidemia - Continue home statin  Urge urinary incontinence Patient had a chronic Foley catheter in place with history of urge urinary incontinence. Foley was exchanged in the ED. - Continue with Foley catheter care  Hypokalemia Potassium of 3.1 with normal magnesium. - Replace potassium and monitor  Subjective: Patient was sitting in chair and talking on phone when seen today.  Remain oriented to name only but becoming more alert and participating in care.  Physical Exam: Vitals:   12/11/23 1522 12/11/23 2100 12/12/23 0406 12/12/23 0929  BP: (!) 140/70 (!) 147/79 (!) 140/75 (!) 161/71  Pulse: 82 86 69 (!) 56  Resp: 16 16 20 16   Temp: 98.2 F (36.8 C) 98.2 F (36.8  C) 98 F (36.7 C) 97.8 F (36.6 C)  TempSrc: Oral     SpO2: 100% 100% 99% (!) 85%  Weight:       General. Frail and malnourished elderly lady, In no acute distress. Pulmonary.  Lungs clear bilaterally, normal respiratory effort. CV.  Regular rate and rhythm, no JVD, rub or murmur. Abdomen.  Soft, nontender, nondistended, BS positive. CNS.  Alert and oriented .  No focal neurologic deficit. Extremities.  No edema, no cyanosis, pulses intact and symmetrical.   Data Reviewed: Prior data reviewed.  Family Communication: Discussed with son on phone.  Disposition: Status is: Inpatient Remains inpatient appropriate because: Severity of illness  Planned Discharge Destination: SNF  DVT prophylaxis.  Coumadin -currently on hold due to supratherapeutic INR Time spent: 50 minutes  This record has been created using Conservation officer, historic buildings. Errors have been sought and corrected,but may not always be located. Such creation errors do not reflect on the standard of care.   Author: Amaryllis Dare, MD 12/12/2023 2:45 PM  For on call review [email or web address].

## 2023-12-12 NOTE — Progress Notes (Signed)
 Mobility Specialist - Progress Note   12/12/23 1613  Mobility  Activity Pivoted/transferred from chair to bed  Level of Assistance Minimal assist, patient does 75% or more  Assistive Device Front wheel walker  Distance Ambulated (ft) 3 ft  Activity Response Tolerated well  Mobility visit 1 Mobility  Mobility Specialist Start Time (ACUTE ONLY) 1559  Mobility Specialist Stop Time (ACUTE ONLY) 1611  Mobility Specialist Time Calculation (min) (ACUTE ONLY) 12 min   Pt sitting in the recliner upon entry, utilizing RA. Pt transferred to bed via SPT MinA +2 for safety/equipment--- requiring multimodal cueing for task initiation and redirection. Pt left supine with alarm set and needs within reach.  America Silvan Mobility Specialist 12/12/23 4:29 PM

## 2023-12-12 NOTE — Progress Notes (Signed)
*  PRELIMINARY RESULTS* Echocardiogram 2D Echocardiogram has been performed.  Floydene Harder 12/12/2023, 11:48 AM

## 2023-12-12 NOTE — Evaluation (Signed)
 Occupational Therapy Evaluation Patient Details Name: Cassandra Weiss MRN: [id number] DOB: [birth year] Today's Date: 12/12/2023   History of Present Illness   Pt is an 89 y.o. female who was brought to the ER with altered mental status and hypotension. She had been less responsive for about 2 days. Pt admitted for management of sepsis secondary to UTI. PMH of atrial fibrillation, hypertension, urge incontinence,  chronic indwelling catheter, hyperlipidemia coronary artery disease, iron deficiency anemia     Clinical Impressions Pt was seen for OT evaluation this date. Pt is oriented to person only and unsure of accuracy of history obtained. Some information taken from previous admission 2 months ago when pt was living with her husband who is available 24/7. Pt was MOD I with ADL performance at that time and had spousal assist with IADLs as well as a cleaning lady coming in. Pt has history of falls and reports use of SPC or RW now, however prior to last admission reported ambulating without AD in the home. Pt presents with deficits in strength, balance, cognition, and activity tolerance, affecting safe and optimal ADL completion. Pt currently requires Max/Mod A for bed mobility, Mod A x1 for STS from EOB to RW with cues for hand/feet placement and Min A x1 using RW for step pivot to recliner from bed. Pt with incont BM in bed, able to stand at [facility] with Min/CGA for balance while requiring Max A for peri-care. Pt required Max A for LB dressing to donn bil socks after increased time attempting at bed level. Pt is very limited by R shoulder ROM limitations from OA. She was able to scoot herself back in the recliner and required increased time and cueing with set up assist to feed self. Pt would benefit from skilled OT services to address noted impairments and functional limitations to maximize return to PLOF. Do anticipate the need for follow up OT services upon acute hospital DC.      If plan is discharge  home, recommend the following:   A lot of help with bathing/dressing/bathroom;A lot of help with walking and/or transfers;Help with stairs or ramp for entrance;Assist for transportation;Assistance with cooking/housework     Functional Status Assessment   Patient has had a recent decline in their functional status and demonstrates the ability to make significant improvements in function in a reasonable and predictable amount of time.     Equipment Recommendations   Other (comment) (defer)     Recommendations for Other Services         Precautions/Restrictions   Precautions Precautions: Fall Recall of Precautions/Restrictions: Impaired Restrictions Weight Bearing Restrictions Per Provider Order: No     Mobility Bed Mobility Overal bed mobility: Needs Assistance Bed Mobility: Supine to Sit     Supine to sit: Mod assist, HOB elevated, Used rails, Max assist     General bed mobility comments: able to initiate BLE movement, but required use of chux pad to scoot to EOB and assist for trunkal elevation    Transfers Overall transfer level: Needs assistance Equipment used: Rolling walker (2 wheels) Transfers: Sit to/from Stand, Bed to chair/wheelchair/BSC Sit to Stand: Mod assist     Step pivot transfers: Min assist     General transfer comment: Mod A to stand from EOB x2 trials with cues for hand/feet placement then able to step pivot to recliner using RW with Min A and cues for safety/RW management      Balance Overall balance assessment: Needs assistance Sitting-balance support: Feet supported,  Bilateral upper extremity supported Sitting balance-Leahy Scale: Fair     Standing balance support: Reliant on assistive device for balance, Bilateral upper extremity supported Standing balance-Leahy Scale: Poor Standing balance comment: RW use and Min A external support                           ADL either performed or assessed with clinical judgement    ADL Overall ADL's : Needs assistance/impaired Eating/Feeding: Set up;Sitting;Cueing for sequencing;Cueing for compensatory techinques Eating/Feeding Details (indicate cue type and reason): initial difficulty locating her mouth with spoon d/t being R handed and having RUE deficits from OA, but improvement with cues and proximity to table                 Lower Body Dressing: Maximal assistance;Bed level Lower Body Dressing Details (indicate cue type and reason): able to perform figure four, but unable to utilize RUE to donn socks without Max A Toilet Transfer: Minimal assistance;Rolling walker (2 wheels);Stand-pivot;Cueing for safety;Cueing for sequencing Toilet Transfer Details (indicate cue type and reason): simulated to recliner using RW and cues for technique/safety Toileting- Clothing Manipulation and Hygiene: Maximal assistance;Sit to/from stand Toileting - Clothing Manipulation Details (indicate cue type and reason): incont BM in bed requiring Max A for cleanup standing at bedside             Vision         Perception         Praxis         Pertinent Vitals/Pain Pain Assessment Pain Assessment: Faces Faces Pain Scale: No hurt Pain Intervention(s): Monitored during session     Extremity/Trunk Assessment Upper Extremity Assessment Upper Extremity Assessment: Generalized weakness;RUE deficits/detail RUE Deficits / Details: chronic R shoulder OA with ROM deficits   Lower Extremity Assessment Lower Extremity Assessment: Generalized weakness       Communication Communication Communication: No apparent difficulties   Cognition Arousal: Alert Behavior During Therapy: WFL for tasks assessed/performed Cognition: Cognition impaired   Orientation impairments: Place, Time, Situation                           Following commands: Impaired Following commands impaired: Follows one step commands with increased time     Cueing  General Comments    Cueing Techniques: Verbal cues;Tactile cues;Gestural cues      Exercises Other Exercises Other Exercises: Edu on role of OT in acute setting.   Shoulder Instructions      Home Living Family/patient expects to be discharged to:: Private residence Living Arrangements: Spouse/significant other Available Help at Discharge: Family;Available 24 hours/day Type of Home: House Home Access: Stairs to enter [employer] of Steps: 4 on the side of the house, 4-5 in the front- pt goes in on the side, rail on the R going up   Home Layout: One level     Bathroom Shower/Tub: Tub/shower unit;Sponge bathes at baseline   [employer]: Standard     Home Equipment: Grab bars - tub/shower   Additional Comments: from previous chart review in July: husband is home 24/7, has someone come in to clean the house 2x, does shopping if needed for approx 2 hrs      Prior Functioning/Environment Prior Level of Function : History of Falls (last six months);Patient poor historian/Family not available             Mobility Comments: per previous chart review, no AD in  the home and use of SPC outside of the home; pt now reports use of SPC or RW to ambulate in the home ADLs Comments: pt reports MOD I with ADL, assist with IADL from husband, has housekeeper 2x per month for approx 2 hrs    OT Problem List: Decreased strength;Decreased range of motion;Decreased activity tolerance;Impaired balance (sitting and/or standing);Decreased cognition   OT Treatment/Interventions: Self-care/ADL training;Therapeutic exercise;Patient/family education;Balance training;Therapeutic activities      OT Goals(Current goals can be found in the care plan section)   Acute Rehab OT Goals OT Goal Formulation: Patient unable to participate in goal setting Time For Goal Achievement: 12/26/23 Potential to Achieve Goals: Fair ADL Goals Pt Will Perform Grooming: with set-up;sitting Pt Will Perform Lower Body  Dressing: with contact guard assist;sit to/from stand;sitting/lateral leans Pt Will Transfer to Toilet: with contact guard assist;ambulating   OT Frequency:  Min 2X/week    Co-evaluation              AM-PAC OT 6 Clicks Daily Activity     Outcome Measure Help from another person eating meals?: A Little Help from another person taking care of personal grooming?: A Little Help from another person toileting, which includes using toliet, bedpan, or urinal?: A Lot Help from another person bathing (including washing, rinsing, drying)?: A Lot Help from another person to put on and taking off regular upper body clothing?: A Lot Help from another person to put on and taking off regular lower body clothing?: A Lot 6 Click Score: 14   End of Session Equipment Utilized During Treatment: Gait belt;Rolling walker (2 wheels) Nurse Communication: Mobility status  Activity Tolerance: Patient tolerated treatment well Patient left: in chair;with call bell/phone within reach;with chair alarm set  OT Visit Diagnosis: Other abnormalities of gait and mobility (R26.89);Unsteadiness on feet (R26.81);Muscle weakness (generalized) (M62.81)                Time: 9164-9140 OT Time Calculation (min): 24 min Charges:  OT General Charges $OT Visit: 1 Visit OT Evaluation $OT Eval Moderate Complexity: 1 Mod OT Treatments $Self Care/Home Management : 8-22 mins Kasean Denherder, OTR/L 12/12/23, 10:21 AM  Cassandra Weiss 12/12/2023, 10:17 AM

## 2023-12-12 NOTE — Progress Notes (Signed)
 Initial Nutrition Assessment  DOCUMENTATION CODES:   Not applicable  INTERVENTION:   -Continue Ensure Plus High Protein po TID, each supplement provides 350 kcal and 20 grams of protein  -MVI with minerals daily -Liberalize diet to regular for widest variety of meal selections -500 mg vitamin C  BID -220 mg zinc  sulfate daily x 14 days -RD will draw labs to assess for potential micronutrient deficiencies which may impede wound healing: vitamin A   NUTRITION DIAGNOSIS:   Increased nutrient needs related to wound healing as evidenced by estimated needs.  GOAL:   Patient will meet greater than or equal to 90% of their needs  MONITOR:   PO intake, Supplement acceptance  REASON FOR ASSESSMENT:   Consult Assessment of nutrition requirement/status  ASSESSMENT:   Pt with medical history significant of atrial fibrillation, hypertension, urge incontinence, hyperlipidemia coronary artery disease, iron deficiency anemia who was brought in with altered mental status and hypotension.  Patient apparently has been less responsive for about 2 days PTA.   Patient has chronic indwelling catheter.  Pt admitted with sepsis secondary to UTI, MSSA bacteremia, and AKI.  9/21- s/p BSE- NPO  Reviewed I/O's: +204 ml x 24 hours and +3.5 L since admission  UOP: 450 ml x 24 hours  Pt sitting up in recliner chair at time of visit. No family present. Pt eager to engage this RD in conversation. She states that she feels a little better today; noted pt with some mild confusion, but able to be redirected easily.   Pt reports there's no problem with my appetite. She reports she drank milk and ate an orange and applesauce today (noted remnants of orange of pt's mouth). Pt denies any chewing or swallowing issues.   PTA, she consumes about 2 meals per day. Pt shares that she does not follow a strict meal schedule and it often depends on when she gets up (pt often skips breakfast due to sleeping in). She  reports she sometimes cooks for herself, but also has food delivered by friends, take out, or Meal on Wheels. Meals typically consist of a meat, starch, and vegetable. Pt reports if I like it, I eat it and if I don't, I don't. Per pt, she thinks she has eaten less over the past few weeks due to feeling more tired than usual.   Pt on a heart healthy diet since 12/08/23. Meal completions 0-100%.   Pt denies any weight loss. Reviewed wt hx; pt has experienced a 2.9% wt loss over the past 2 months, which is not significant for time frame.   Discussed importance of good meal and supplement intake to promote healing. Pt amenable to supplements, states she drinks Ensure and Boost at home once daily.   Palliative care following for goals of care discussions. Plan to continue supportive care; pt family would be open to NGT placement, but no PEG.   Medications reviewed.   Labs reviewed: CBGS: 100.   NUTRITION - FOCUSED PHYSICAL EXAM:  Flowsheet Row Most Recent Value  Orbital Region No depletion  Upper Arm Region Mild depletion  Thoracic and Lumbar Region No depletion  Buccal Region No depletion  Temple Region No depletion  Clavicle Bone Region No depletion  Clavicle and Acromion Bone Region No depletion  Dorsal Hand No depletion  Patellar Region Mild depletion  Anterior Thigh Region Mild depletion  Posterior Calf Region Mild depletion  Edema (RD Assessment) Mild  Hair Reviewed  Eyes Reviewed  Mouth Reviewed  Skin Reviewed  Nails Reviewed  Diet Order:   Diet Order             Diet regular Fluid consistency: Thin  Diet effective now                   EDUCATION NEEDS:   Education needs have been addressed  Skin:  Skin Assessment: Skin Integrity Issues: Skin Integrity Issues:: Unstageable Unstageable: coccyx, lt heel  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  Estimated Nutritional Needs:   Kcal:  1550-1750  Protein:  85-100 grams  Fluid:  1.5-1.7 L    Margery ORN, RD, LDN, CDCES Registered Dietitian III Certified Diabetes Care and Education Specialist If unable to reach this RD, please use RD Inpatient group chat on secure chat between hours of 8am-4 pm daily

## 2023-12-12 NOTE — Evaluation (Signed)
 Physical Therapy Evaluation Patient Details Name: Cassandra Weiss MRN: [id number] DOB: 1933/01/21 Today's Date: 12/12/2023  History of Present Illness  Pt is an 89 y.o. female who was brought to the ER with altered mental status and hypotension. She had been less responsive for about 2 days. Pt admitted for management of sepsis secondary to UTI. PMH of atrial fibrillation, hypertension, urge incontinence,  chronic indwelling catheter, hyperlipidemia coronary artery disease, iron deficiency anemia  Clinical Impression  Pt is 89 y/o female patient admitted for AMS and hypotension. Pt is poor historian, and some information gathered from previous admission. Pt reports no use of AD when ambulating in home. Pt reports requiring assistance with ADLs. During evaluation, pt required assistance with transfer to the [facility] for BM; Max A for peri care. Pt able to stand with MOD A, however inconsistently. Multiple attempts for STS this session, and pt unable to maintain stand once up possibly d/t inability to understand the task. Min A for transfer to [facility] using RW. Pt demonstrates deficits with strength/balance/activity tolerance. Would benefit from skilled PT to address above deficits and promote optimal return to PLOF.         If plan is discharge home, recommend the following: A lot of help with walking and/or transfers;A lot of help with bathing/dressing/bathroom;Assistance with cooking/housework;Direct supervision/assist for medications management;Assist for transportation;Help with stairs or ramp for entrance;Supervision due to cognitive status   Can travel by private vehicle   No    Equipment Recommendations Other (comment) (TBD at next venue)  Recommendations for Other Services       Functional Status Assessment Patient has had a recent decline in their functional status and demonstrates the ability to make significant improvements in function in a reasonable and predictable amount of time.      Precautions / Restrictions Precautions Precautions: Fall Recall of Precautions/Restrictions: Impaired Restrictions Weight Bearing Restrictions Per Provider Order: No      Mobility  Bed Mobility Overal bed mobility: Needs Assistance             General bed mobility comments: NT. Pt received in recliner pre/post session    Transfers Overall transfer level: Needs assistance Equipment used: Rolling walker (2 wheels) Transfers: Sit to/from Stand, Bed to chair/wheelchair/BSC Sit to Stand: Mod assist   Step pivot transfers: Min assist       General transfer comment: Multiple attempts to stand from chair; MOD A once standing. Pt seemed to not be able to process/understand the task at hand and would sit back down. Max multimodal cuing for sequencing and hand placement. Min A for transfer from recliner<>BSC.    Ambulation/Gait               General Gait Details: NT. Pt unable at this time d/t weakness  Stairs            Wheelchair Mobility     Tilt Bed    Modified Rankin (Stroke Patients Only)       Balance Overall balance assessment: Needs assistance Sitting-balance support: Feet supported, Bilateral upper extremity supported Sitting balance-Leahy Scale: Fair Sitting balance - Comments: Able to maintain seated balance on BSC and in recliner.   Standing balance support: Bilateral upper extremity supported, During functional activity, Reliant on assistive device for balance Standing balance-Leahy Scale: Poor Standing balance comment: Heavy use of RW for balance. Multimodal cuing for hand placement once standing and for upright posture.  Pertinent Vitals/Pain Pain Assessment Pain Assessment: Faces Faces Pain Scale: No hurt    Home Living Family/patient expects to be discharged to:: Private residence Living Arrangements: Spouse/significant other Available Help at Discharge: Family;Available 24 hours/day Type  of Home: House Home Access: Stairs to enter   [employer] of Steps: 4 on the side of the house, 4-5 in the front- pt goes in on the side, rail on the R going up   Home Layout: One level Home Equipment: Grab bars - tub/shower Additional Comments: Info from previous admission. Pt is poor historian    Prior Function Prior Level of Function : History of Falls (last six months);Patient poor historian/Family not available             Mobility Comments: Pt reports no use of AD for ambulation. 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    Lower Extremity Assessment Lower Extremity Assessment: Generalized weakness    Cervical / Trunk Assessment Cervical / Trunk Assessment: Normal  Communication   Communication Communication: No apparent difficulties    Cognition Arousal: Alert Behavior During Therapy: WFL for tasks assessed/performed   PT - Cognitive impairments: History of cognitive impairments                       PT - Cognition Comments: pleasant and agreeable to PT session Following commands: Impaired Following commands impaired: Follows one step commands with increased time     Cueing Cueing Techniques: Verbal cues, Tactile cues, Gestural cues     General Comments      Exercises Other Exercises Other Exercises: Pt able to maintain balance for peri care at [facility]; Max A for peri care.   Assessment/Plan    PT Assessment Patient needs continued PT services  PT Problem List Decreased strength;Decreased activity tolerance;Decreased balance;Decreased mobility;Decreased coordination;Decreased cognition;Decreased knowledge of use of DME       PT Treatment Interventions Gait training;DME instruction;Functional mobility  training;Therapeutic activities;Therapeutic exercise;Balance training;Neuromuscular re-education;Patient/family education    PT Goals (Current goals can be found in the Care Plan section)  Acute Rehab PT Goals Patient Stated Goal: pt unable to participate in goal setting PT Goal Formulation: With patient Time For Goal Achievement: 12/26/23 Potential to Achieve Goals: Fair    Frequency Min 2X/week     Co-evaluation               AM-PAC PT 6 Clicks Mobility  Outcome Measure Help needed turning from your back to your side while in a flat bed without using bedrails?: A Little Help needed moving from lying on your back to sitting on the side of a flat bed without using bedrails?: A Little Help needed moving to and from a bed to a chair (including a wheelchair)?: A Little Help needed standing up from a chair using your arms (e.g., wheelchair or bedside chair)?: A Lot Help needed to walk in hospital room?: A Lot Help needed climbing 3-5 steps with a railing? : Total 6 Click Score: 14    End of Session Equipment Utilized During Treatment: Gait belt Activity Tolerance: Patient tolerated treatment well Patient left: in chair;with call bell/phone within reach;with chair alarm set Nurse Communication: Mobility status PT Visit Diagnosis: Unsteadiness on feet (R26.81);History of falling (Z91.81);Muscle weakness (generalized) (M62.81);Difficulty in walking, not elsewhere classified (R26.2)  Time: 9048-8979 PT Time Calculation (min) (ACUTE ONLY): 29 min   Charges:                 Iven Earnhart, SPT   Maleigh Bagot 12/12/2023, 12:14 PM

## 2023-12-12 NOTE — Care Management Important Message (Signed)
 Important Message  Patient Details  Name: Cassandra Weiss MRN: [id number] Date of Birth: 13-Mar-1933   Important Message Given:  Yes - Medicare IM     [name] 12/12/2023, 4:56 PM

## 2023-12-13 ENCOUNTER — Encounter: Payer: Self-pay | Admitting: Internal Medicine

## 2023-12-13 DIAGNOSIS — Z515 Encounter for palliative care: Secondary | ICD-10-CM | POA: Diagnosis not present

## 2023-12-13 DIAGNOSIS — R4182 Altered mental status, unspecified: Secondary | ICD-10-CM | POA: Diagnosis not present

## 2023-12-13 DIAGNOSIS — N179 Acute kidney failure, unspecified: Secondary | ICD-10-CM | POA: Diagnosis not present

## 2023-12-13 DIAGNOSIS — E870 Hyperosmolality and hypernatremia: Secondary | ICD-10-CM | POA: Diagnosis not present

## 2023-12-13 LAB — CBC
HCT: 31.6 % — ABNORMAL LOW (ref 36.0–46.0)
Hemoglobin: 9.9 g/dL — ABNORMAL LOW (ref 12.0–15.0)
MCH: 27.6 pg (ref 26.0–34.0)
MCHC: 31.3 g/dL (ref 30.0–36.0)
MCV: 88 fL (ref 80.0–100.0)
Platelets: 248 K/uL (ref 150–400)
RBC: 3.59 MIL/uL — ABNORMAL LOW (ref 3.87–5.11)
RDW: 22.3 % — ABNORMAL HIGH (ref 11.5–15.5)
WBC: 8.5 K/uL (ref 4.0–10.5)
nRBC: 0 % (ref 0.0–0.2)

## 2023-12-13 LAB — BASIC METABOLIC PANEL WITH GFR
Anion gap: 10 (ref 5–15)
BUN: 12 mg/dL (ref 8–23)
CO2: 24 mmol/L (ref 22–32)
Calcium: 8.4 mg/dL — ABNORMAL LOW (ref 8.9–10.3)
Chloride: 108 mmol/L (ref 98–111)
Creatinine, Ser: 1.03 mg/dL — ABNORMAL HIGH (ref 0.44–1.00)
GFR, Estimated: 52 mL/min — ABNORMAL LOW (ref >60)
Glucose, Bld: 81 mg/dL (ref 70–99)
Potassium: 3.6 mmol/L (ref 3.5–5.1)
Sodium: 142 mmol/L (ref 135–145)

## 2023-12-13 LAB — PROTIME-INR
INR: 3.5 — ABNORMAL HIGH (ref 0.8–1.2)
Prothrombin Time: 36.7 s — ABNORMAL HIGH (ref 11.4–15.2)

## 2023-12-13 LAB — CULTURE, BLOOD (ROUTINE X 2)
Culture: NO GROWTH
Special Requests: ADEQUATE

## 2023-12-13 MED ORDER — SODIUM CHLORIDE 0.9 % IV SOLN: INTRAVENOUS | Status: AC

## 2023-12-13 MED ORDER — PHENYLEPHRINE 80 MCG/ML (10ML) SYRINGE FOR IV PUSH (FOR BLOOD PRESSURE SUPPORT)
PREFILLED_SYRINGE | INTRAVENOUS | Status: AC
  Filled 2023-12-13: qty 10

## 2023-12-13 MED ORDER — PROPOFOL 10 MG/ML IV BOLUS
INTRAVENOUS | Status: AC
  Filled 2023-12-13: qty 20

## 2023-12-13 MED ORDER — LIDOCAINE HCL (PF) 2 % IJ SOLN
INTRAMUSCULAR | Status: AC
  Filled 2023-12-13: qty 5

## 2023-12-13 MED ORDER — EPHEDRINE 5 MG/ML INJ
INTRAVENOUS | Status: AC
Start: 2023-12-13 — End: 2023-12-13
  Filled 2023-12-13: qty 5

## 2023-12-13 NOTE — Plan of Care (Signed)
 Wound assessed to did NOT find sacral unstagable, but only small stage 2.  Discussed with Chiropodist - who asked me to add a new wound with correct specifications - rather than documenting on prior incorrect documentation.  Pic added to chart, skin care orders placed and hospitalist informed and aware.

## 2023-12-13 NOTE — TOC Initial Note (Signed)
 Transition of Care [facility]) - Initial/Assessment Note    Patient Details  Name: Cassandra Weiss MRN: [id number] Date of Birth: March 05, 1933  Transition of Care [facility]) CM/SW Contact:    [name], RN Phone Number: 12/13/2023, 1:07 PM  Clinical Narrative:                    Admitted for: UTI Admitted from: home with husband  PCP: Hande Current home health/prior home health/DME:RW, cane, WC, cane  Therapy recommending SNF Patient active with [facility] home health Patient was at [facility] from 7/18-8/20 (33 days) Recs discussed with spouse.  He states he is hopeful that she can return home, and is to discuss with his son Patient pending TEE       Patient Goals and CMS Choice            Expected Discharge Plan and Services                                              Prior Living Arrangements/Services                       Activities of Daily Living      Permission Sought/Granted                  Emotional Assessment              Admission diagnosis:  Hypernatremia [E87.0] Weakness [R53.1] AKI (acute kidney injury) [N17.9] Sepsis secondary to UTI (HCC) [A41.9, N39.0] Altered mental status, unspecified altered mental status type [R41.82] Sepsis, due to unspecified organism, unspecified whether acute organ dysfunction present [facility]) [A41.9] Patient Active Problem List   Diagnosis Date Noted   Hypokalemia 12/10/2023   MSSA bacteremia 12/09/2023   Acute metabolic encephalopathy 12/09/2023   Hypercoagulable state 12/09/2023   Urge urinary incontinence    Acute urinary tract infection 12/08/2023   Iron deficiency anemia 12/08/2023   Sepsis secondary to UTI (HCC) 12/08/2023   Hypernatremia 12/08/2023   AKI (acute kidney injury) 10/04/2023   AMS (altered mental status) 10/04/2023   Altered mental status 01/13/2018   Paroxysmal A-fib (HCC) 05/14/2016   Coronary artery disease 05/12/2016   Hyperlipidemia 05/12/2016   Benign  essential HTN 03/19/2009   PCP:  Sadie Manna, MD Pharmacy:   [facility] - [place], [place] - [address] [place] [address] Phone: [phone] Fax: [phone]     Social Drivers of Health (SDOH) Social History: SDOH Screenings   Food Insecurity: No Food Insecurity (12/09/2023)  Housing: Low Risk  (12/09/2023)  Transportation Needs: No Transportation Needs (12/09/2023)  Utilities: Not At Risk (12/09/2023)  Financial Resource Strain: Low Risk  (08/17/2023)   Received from [facility]  Physical Activity: Insufficiently Active (01/13/2018)  Social Connections: Moderately Integrated (12/09/2023)  Stress: No Stress Concern Present (01/13/2018)  Tobacco Use: Low Risk  (12/13/2023)   SDOH Interventions:     Readmission Risk Interventions    12/13/2023    1:01 PM  Readmission Risk Prevention Plan  Transportation Screening Complete  HRI or Home Care Consult Complete  Palliative Care Screening Complete  Medication Review (RN Care Manager) Complete

## 2023-12-13 NOTE — Progress Notes (Signed)
 Date of Admission:  12/08/2023      ID: Cassandra Weiss is a 89 y.o. female Principal Problem:   Sepsis secondary to UTI [facility]) Active Problems:   Altered mental status   AKI (acute kidney injury)   AMS (altered mental status)   Acute urinary tract infection   Benign essential HTN   Coronary artery disease   Hyperlipidemia   Paroxysmal A-fib (HCC)   Iron deficiency anemia   Hypernatremia   MSSA bacteremia   Acute metabolic encephalopathy   Hypercoagulable state   Urge urinary incontinence   Hypokalemia    Subjective: Doing better Husband and daughter in law at bed side  Medications:   amLODipine   5 mg Oral Daily   vitamin C   500 mg Oral BID   atorvastatin   80 mg Oral Daily   carvedilol   25 mg Oral BID   Chlorhexidine  Gluconate Cloth  6 each Topical Daily   feeding supplement  237 mL Oral TID BM   gabapentin   300 mg Oral TID   hydrALAZINE   50 mg Oral TID   lidocaine   1 patch Transdermal Q24H   multivitamin with minerals  1 tablet Oral Daily   sodium chloride  flush  10-40 mL Intracatheter Q12H   Warfarin - Pharmacist Dosing Inpatient   Does not apply q1600   zinc  sulfate (50mg  elemental zinc )  220 mg Oral Daily    Objective: Vital signs in last 24 hours: Patient Vitals for the past 24 hrs:  BP Temp Pulse Resp SpO2  12/12/23 2016 125/84 98.5 F (36.9 C) 95 16 97 %  12/12/23 1721 122/63 (!) 97.5 F (36.4 C) 82 16 99 %  12/12/23 0929 (!) 161/71 97.8 F (36.6 C) (!) 56 16 (!) 85 %  12/12/23 0406 (!) 140/75 98 F (36.7 C) 69 20 99 %     PHYSICAL EXAM:  General: Alert, cooperative, no distress, some confusion Head: Normocephalic, without obvious abnormality, atraumatic. Eyes: Conjunctivae clear, anicteric sclerae. Pupils are equal ENT Nares normal. No drainage or sinus tenderness. Lips, mucosa, and tongue normal. No Thrush Neck: Supple, symmetrical, no adenopathy, thyroid : non tender no carotid bruit and no JVD. Back: No CVA tenderness. Lungs: Clear to  auscultation bilaterally. No Wheezing or Rhonchi. No rales. Heart: Regular rate and rhythm, no murmur, rub or gallop. Abdomen: Soft, non-tender,not distended. Bowel sounds normal. No masses Extremities: atraumatic, no cyanosis. No edema. No clubbing Skin: No rashes or lesions. Or bruising Lymph: Cervical, supraclavicular normal. Neurologic: Grossly non-focal  Lab Results    Latest Ref Rng & Units 12/11/2023    6:38 AM 12/10/2023   10:23 AM 12/09/2023    5:20 AM  CBC  WBC 4.0 - 10.5 K/uL 8.0  7.8  8.8   Hemoglobin 12.0 - 15.0 g/dL 9.2  7.2  7.9   Hematocrit 36.0 - 46.0 % 28.0  23.7  27.0   Platelets 150 - 400 K/uL 249  242  296        Latest Ref Rng & Units 12/11/2023    6:38 AM 12/10/2023   10:23 AM 12/09/2023    5:20 AM  CMP  Glucose 70 - 99 mg/dL 885  94  99   BUN 8 - 23 mg/dL 20  27  43   Creatinine 0.44 - 1.00 mg/dL 8.82  8.84  8.59   Sodium 135 - 145 mmol/L 144  148  155   Potassium 3.5 - 5.1 mmol/L 3.5  3.1  3.9   Chloride 98 -  111 mmol/L 110  111  120   CO2 22 - 32 mmol/L 26  26  25    Calcium  8.9 - 10.3 mg/dL 8.7  8.5  8.9   Total Protein 6.5 - 8.1 g/dL   6.2   Total Bilirubin 0.0 - 1.2 mg/dL   1.0   Alkaline Phos 38 - 126 U/L   49   AST 15 - 41 U/L   21   ALT 0 - 44 U/L   13       Microbiology: 9/18 1 set  MSSA UC Ecoli Studies/Results: ECHOCARDIOGRAM COMPLETE Result Date: 12/12/2023    ECHOCARDIOGRAM REPORT   Patient Name:   Cassandra Weiss Date of Exam: 12/12/2023 Medical Rec #:  [id number]     Height:       62.0 in Accession #:    [id number]    Weight:       131.6 lb Date of Birth:  [birth year]     BSA:          1.600 m Patient Age:    89 years      BP:           161/71 mmHg Patient Gender: F             HR:           56 bpm. Exam Location:  [facility] Procedure: 2D Echo, Cardiac Doppler and Color Doppler (Both Spectral and Color            Flow Doppler were utilized during procedure). Indications:     Bacteremia R78.81  History:         Patient has no prior history of  Echocardiogram examinations.                  Arrythmias:Atrial Fibrillation; Risk Factors:Hypertension.  Sonographer:     Christopher Furnace Referring Phys:  [id number] DONALD BERLIN Diagnosing Phys: Keller Paterson  Sonographer Comments: Technically challenging study due to limited acoustic windows and no apical window. IMPRESSIONS  1. Technically difficult study with no apical views.  2. Left ventricular ejection fraction, by estimation, is 60 to 65%. The left ventricle has normal function. Left ventricular endocardial border not optimally defined to evaluate regional wall motion. 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 Left ventricular diastolic parameters are indeterminate.  3. Right ventricular systolic function was not well visualized. The right ventricular size is not well visualized.  4. The mitral valve is normal in structure. Trivial mitral valve regurgitation.  5. Echogenicity (0.5 cm X 0.5 cm) noted on ventricular aspect of aortic valve which can suggest vegetation vs degenerative change. Recommend TEE for further evaluation as clinically indicated. The aortic valve is tricuspid. Aortic valve regurgitation is  not visualized. Aortic valve sclerosis/calcification is present, without any evidence of aortic stenosis.  6. The inferior vena cava is normal in size with greater than 50% respiratory variability, suggesting right atrial pressure of 3 mmHg. FINDINGS  Left Ventricle: Left ventricular ejection fraction, by estimation, is 60 to 65%. The left ventricle has normal function. Left ventricular endocardial border not optimally defined to evaluate regional wall motion. The left ventricular internal cavity size was normal in size. 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 Left ventricular diastolic parameters are indeterminate. Right Ventricle: The right ventricular size is not well visualized. Right vetricular wall thickness was not  well visualized. Right ventricular systolic function was not well visualized. Left Atrium: Left atrial size  was not well visualized. Right Atrium: Right atrial size was not well visualized. Pericardium: There is no evidence of pericardial effusion. Mitral Valve: The mitral valve is normal in structure. Mild mitral annular calcification. Trivial mitral valve regurgitation. Tricuspid Valve: The tricuspid valve is normal in structure. Tricuspid valve regurgitation is mild. Aortic Valve: Echogenicity (0.5 cm X 0.5 cm) noted on ventricular aspect of aortic valve which can suggest vegetation vs degenerative change. Recommend TEE for further evaluation as clinically indicated. The aortic valve is tricuspid. Aortic valve regurgitation is not visualized. Aortic valve sclerosis/calcification is present, without any evidence of aortic stenosis. Pulmonic Valve: The pulmonic valve was not well visualized. Pulmonic valve regurgitation is trivial. Aorta: The aortic root is normal in size and structure. Venous: The inferior vena cava is normal in size with greater than 50% respiratory variability, suggesting right atrial pressure of 3 mmHg. IAS/Shunts: The interatrial septum was not well visualized.  LEFT VENTRICLE PLAX 2D LVIDd:         3.00 cm LVIDs:         2.00 cm LV PW:         1.30 cm LV IVS:        1.80 cm LVOT diam:     2.00 cm LVOT Area:     3.14 cm  LEFT ATRIUM         Index LA diam:    4.80 cm 3.00 cm/m   AORTA Ao Root diam: 3.30 cm TRICUSPID VALVE TR Peak grad:   28.1 mmHg TR Vmax:        265.00 cm/s  SHUNTS Systemic Diam: 2.00 cm Keller Paterson Electronically signed by Keller Paterson Signature Date/Time: 12/12/2023/12:47:21 PM    Final    DG Chest Port 1 View Result Date: 12/11/2023 CLINICAL DATA:  PICC line placement EXAM: PORTABLE CHEST 1 VIEW COMPARISON:  12/11/2023 FINDINGS: Left PICC line in place with the tip in the SVC. Cardiomegaly, vascular congestion. Interstitial prominence may reflect interstitial  edema. No effusions. IMPRESSION: Left PICC line tip in the SVC. Cardiomegaly with vascular congestion and suspected early interstitial edema. Electronically Signed   By: Franky Crease M.D.   On: 12/11/2023 17:10   DG Chest Port 1 View Result Date: 12/11/2023 CLINICAL DATA:  PICC line placement. EXAM: PORTABLE CHEST 1 VIEW COMPARISON:  12/08/2023 FINDINGS: The cardio pericardial silhouette is enlarged. There is pulmonary vascular congestion without overt pulmonary edema. No pleural effusion or pneumothorax. Left PICC line tip overlies the region of the innominate vein confluence. Bones are diffusely demineralized with degenerative changes noted in both shoulders. IMPRESSION: Left PICC line tip overlies the region of the innominate vein confluence. Electronically Signed   By: Camellia Candle M.D.   On: 12/11/2023 08:56     Assessment/Plan: Acute metabolic encephalopathy due to dehydration, hypernatremia and infection- improved but not back to baseline ? Hypotension- resolved  sepsis /dehydration   Sepsis due to MSSA bacteremia and possible  UTI due to foley   MSSA bacteremia 1/4- she has some chronic skin wounds- left heel eschar- dry  And superficial sacral decub These could be source Observe for rt shoulder septic arthritis Change ceftriaxone  to cefazolin  to be more focused for MSSA 2d echo questions aortic valve vegetation TEE requested   Complicated UTI possible Has foley changed in ED B/l renal proteinaceous cysts VS hemorrhage Will add levaquin  till urine culture results   Hypernatremia- secondary to dehydration/poor intake- resolved   AKI - improving   Anemia ( r/o bleed)  On coumadin  high INR Received Vitamin K  improved    Discussed with family and hospitalist

## 2023-12-13 NOTE — Progress Notes (Signed)
 Progress Note   Patient: Cassandra Weiss FMW:[id number] DOB: Mar 31, 1932 DOA: 12/08/2023     5 DOS: the patient was seen and examined on 12/13/2023   Brief hospital course: Partly taken from H&P.  Cassandra Weiss is a 89 y.o. female with medical history significant of atrial fibrillation, hypertension, urge incontinence, hyperlipidemia coronary artery disease, iron deficiency anemia who was brought to the ER with altered mental status and hypotension.  Patient apparently has been less responsive for about 2 days.  Patient has chronic indwelling catheter.   On presentation patient was hypothermic at 95.6, borderline soft blood pressure, sodium of 156, hemoglobin 9.3, chloride 115, BUN 50, creatinine 1.71, INR of 8.8 on warfarin, respiratory panel negative.  UA concerning for UTI.  CT renal stone shows nonobstructing stone, otherwise unremarkable. CT head was negative for any acute abnormality. CXR with no acute cardiopulmonary abnormalities.  Patient was admitted for concern of sepsis secondary to UTI and started on ceftriaxone .  Blood cultures were drawn.   9/19: Vitals stable, hemoglobin decreased to 7.9 and INR increased to 10.1, no obvious bleeding, giving vitamin K .  Sodium at 155, creatinine with some improvement to 1.40-baseline seems to be around 1.1 Ordered urine culture as add-on.  Preliminary blood culture 1 out of 4 bottles with MSSA. Switching IV fluid to D5 with water deficit of 2.9L. Patient with significant underlying comorbidities, advanced age and poor functional status-consulting palliative care to discuss goals of care.  At baseline patient just walked with the help of walker from bed to her recliner, completely dependent for all ADLs.  Son manages the medications but patient does not take them regularly.  Lives with husband of the similar age, had a caregiver during the day.  9/20: Remained quite lethargic and somnolent, CODE STATUS changed to DNR after palliative care meeting  but family wants maximum medical intervention.  Right shoulder imaging was obtained due to concern of recent concern of joint effusion in July with severe osteoarthritis.  MSSA bacteremia can be due to few smaller skin lesions and injuries, if there is a joint effusion we will ask Ortho to tap to rule out any septic arthritis. Urine cultures growing E. Coli.  INR improved to 1.7, hemoglobin decreased to 7.2, no active bleeding-did 1 unit of PRBC.  Replacing potassium, sodium improved to 148 with improving renal function-continuing D5 with potassium for another day.  9/21: Vital stable, hypernatremia improved, renal function seems stable at 1.17, p.o. intake remained poor, switching IV fluid to LR as maintenance fluid.  Urine cultures with pansensitive E. coli so Levaquin  is being discontinued.  Patient will continue on cefazolin .  Repeat blood cultures pending. PT and OT evaluation ordered.  9/22: Hemodynamically remained stable, repeat blood cultures negative in 24 hours.  Echocardiogram with normal EF, asymmetric left LV hypertrophy, there was an echogenicity noted on aortic valve which could be due to vegetation versus degenerative changes with recommendations to do TEE for further evaluation. [facility] cardiology consulted for TEE. PT recommending SNF  9/23: Remained stable, repeat blood cultures remain negative, TEE is planned for later today. INR of 3.5   Assessment and Plan: * Sepsis secondary to UTI (HCC) MSSA bacteremia. UA concerning for UTI, urine cultures growing pansensitive E. coli, preliminary blood cultures with  MSSA.  Right shoulder imaging with severe degenerative changes, no effusion.  Multiple small skin lesions likely the source of infection. Hypothermia has been improved. Echocardiogram with concern of an echogenic lesion on aortic valve-need TEE for further evaluation  and to rule out endocarditis-plan for later today - Ceftriaxone  was switched to cefazolin  -Discontinuing  Levaquin  -Repeat blood cultures remain negative -ID was consulted -Continue supportive care  AKI (acute kidney injury) Likely secondary to severe dehydration and poor p.o. intake, creatinine at 1.03 which is around baseline -Monitor renal function -Avoid nephrotoxins  Hypernatremia Resolved. -monitor sodium  Acute metabolic encephalopathy Patient is currently oriented to name only and not been able to participate with any meaningful history or care. Concern of underlying advanced dementia. Per son she is now close to baseline - Delirium precautions.  Benign essential HTN Blood pressure now elevated. -Restarting home amlodipine  and hydralazine  -Monitor blood pressure  Paroxysmal A-fib (HCC) EKG with A-fib, rate seems controlled. - Continuing carvedilol  - Coumadin  per pharmacy  Hypercoagulable state INR improved  s/p vitamin K  -Monitor INR -Coumadin  per pharmacy  Coronary artery disease Unable to explain any chest pain, barely positive troponin with a flat curve. - Continuing home carvedilol  and statin  Iron deficiency anemia Iron deficiency anemia mentioned in her chart, hemoglobin improved to 9.2 s/p 1 unit of PRBC Anemia panel consistent with anemia of chronic disease and mild iron deficiency -Monitor hemoglobin -Transfuse if below 7  Hyperlipidemia - Continue home statin  Urge urinary incontinence Patient had a chronic Foley catheter in place with history of urge urinary incontinence. Foley was exchanged in the ED. - Continue with Foley catheter care  Hypokalemia Potassium of 3.1 with normal magnesium. - Replace potassium and monitor  Subjective: Patient was lying comfortably when seen today.  No new concern.  Remained pleasantly confused and oriented to self.  Physical Exam: Vitals:   12/12/23 2016 12/13/23 0503 12/13/23 0820 12/13/23 1557  BP: 125/84 96/81 (!) 112/99 (!) 113/56  Pulse: 95 91 84 65  Resp: 16 16 20 17   Temp: 98.5 F (36.9 C) 97.6 F  (36.4 C) 97.8 F (36.6 C) 97.9 F (36.6 C)  TempSrc:   Oral Oral  SpO2: 97% 96% 99% 97%  Weight:       General.  Frail and malnourished elderly lady, in no acute distress. Pulmonary.  Lungs clear bilaterally, normal respiratory effort. CV.  Regular rate and rhythm, no JVD, rub or murmur. Abdomen.  Soft, nontender, nondistended, BS positive. CNS.  Alert and oriented .  No focal neurologic deficit. Extremities.  No edema, no cyanosis, pulses intact and symmetrical.   Data Reviewed: Prior data reviewed.  Family Communication:   Disposition: Status is: Inpatient Remains inpatient appropriate because: Severity of illness  Planned Discharge Destination: SNF  DVT prophylaxis.  Coumadin -currently on hold due to supratherapeutic INR Time spent: 50 minutes  This record has been created using Conservation officer, historic buildings. Errors have been sought and corrected,but may not always be located. Such creation errors do not reflect on the standard of care.   Author: Amaryllis Dare, MD 12/13/2023 6:10 PM  For on call review [email or web address].

## 2023-12-13 NOTE — Consult Note (Addendum)
 PHARMACY - ANTICOAGULATION CONSULT NOTE  Pharmacy Consult for warfarin  Indication: Afib   Allergies  Allergen Reactions   Penicillins Rash    Has patient had a PCN reaction causing immediate rash, facial/tongue/throat swelling, SOB or lightheadedness with hypotension: Unknown Has patient had a PCN reaction causing severe rash involving mucus membranes or skin necrosis: Unknown Has patient had a PCN reaction that required hospitalization: Unknown Has patient had a PCN reaction occurring within the last 10 years: Unknown If all of the above answers are NO, then may proceed with Cephalosporin use.     Patient Measurements: Weight: 59.7 kg (131 lb 9.8 oz)  Vital Signs: Temp: 97.6 F (36.4 C) (09/23 0503) BP: 96/81 (09/23 0503) Pulse Rate: 91 (09/23 0503)  Labs: Recent Labs    12/10/23 1023 12/11/23 9361 12/12/23 0504 12/13/23 0522  HGB 7.2* 9.2*  --  9.9*  HCT 23.7* 28.0*  --  31.6*  PLT 242 249  --  248  LABPROT  --  31.0* 35.4* 36.7*  INR  --  2.8* 3.3* 3.5*  CREATININE 1.15* 1.17*  --  1.03*    Estimated Creatinine Clearance: 29.3 mL/min (A) (by C-G formula based on SCr of 1.03 mg/dL (H)).   Medical History: Past Medical History:  Diagnosis Date   Arthritis    Atrial fibrillation (HCC)    Hypertension    Urge urinary incontinence     Medications:  Reported home regimen is Warfarin 1 mg po daily with last dose 12/08/23  Assessment: 89 yo female with a PMH of A.fib, HTN, and CAD presented to ED due to altered mental status.  In ED patient found to have supratherapeutic INR.  Pharmacy consulted to manage warfarin dosing.  DDI: Ancef  (increase INR)  Goal of Therapy:  INR 2-3 Monitor platelets by anticoagulation protocol: Yes   Date INR Warfarin Dose  9/18 8.8 1 mg (PTA)  9/19 10.1 Hold. IV vitamin K  given.   9/20 1.7 1 mg  9/21 2.8 Hold  9/22 3.3 Hold  9/23 3.5 Hold     Plan:  INR is supratherapeutic with a 0.2 increase from yesterday after  holding home dose x2 days Increase in INR potentially due to poor diet, (albumin 2.2 on 9/21), and acute illness. Patient placed on NPO status on 9/23 for planned TEE. Will continue to HOLD warfarin dose today Daily INR while inpatient and CBC at least every 72 hours  Annabella LOISE Banks, PharmD Clinical Pharmacist 12/13/2023 7:46 AM

## 2023-12-13 NOTE — Progress Notes (Signed)
 Occupational Therapy Treatment Patient Details Name: Cassandra Weiss MRN: [id number] DOB: Oct 19, 1932 Today's Date: 12/13/2023   History of present illness Pt is an 89 y.o. female who was brought to the ER with altered mental status and hypotension. She had been less responsive for about 2 days. Pt admitted for management of sepsis secondary to UTI. PMH of atrial fibrillation, hypertension, urge incontinence,  chronic indwelling catheter, hyperlipidemia coronary artery disease, iron deficiency anemia   OT comments  Pt is supine in bed on arrival with nurse present attempting to assess pt's wound on buttocks. Pt agreeable to OT session to stand for better assessment. She denies pain. Pt performed bed mobility with Max A and max cues for sequencing and initiation. She required Mod A for STS from EOB to RW and was able to stand for ~1 min while nurse performed assessment. She required increased cueing for safety and RW management during stand step pivot to recliner this date-Mod A overall for safety. Additional stand from recliner requiring Mod A, however pt immediately returning to seated position in recliner. Continues to be limited by cognition. Pt was able perform seated grooming tasks in recliner with set up assist. NT in to perform bathing. Pt left with all needs in place and will cont to require skilled acute OT services to maximize her safety and IND to return to PLOF.       If plan is discharge home, recommend the following:  A lot of help with bathing/dressing/bathroom;A lot of help with walking and/or transfers;Help with stairs or ramp for entrance;Assist for transportation;Assistance with cooking/housework   Equipment Recommendations  Other (comment) (defer)    Recommendations for Other Services      Precautions / Restrictions Precautions Precautions: Fall Recall of Precautions/Restrictions: Impaired Restrictions Weight Bearing Restrictions Per Provider Order: No       Mobility  Bed Mobility Overal bed mobility: Needs Assistance Bed Mobility: Supine to Sit     Supine to sit: Mod assist, HOB elevated, Used rails, Max assist     General bed mobility comments: assit for trunkal elevation and BLE management to reach EOB and forward scoot, max cues    Transfers Overall transfer level: Needs assistance Equipment used: Rolling walker (2 wheels) Transfers: Sit to/from Stand, Bed to chair/wheelchair/BSC Sit to Stand: Mod assist     Step pivot transfers: Min assist, Mod assist     General transfer comment: pt required Mod A to stand from EOB and recliner during session with cues for hand/feet placement, continues to need max cueing for direction following; Min/Mod A for RW management during bed>recliner transfer via stand step pivot     Balance Overall balance assessment: Needs assistance Sitting-balance support: Feet supported, Bilateral upper extremity supported Sitting balance-Leahy Scale: Fair     Standing balance support: Bilateral upper extremity supported, During functional activity, Reliant on assistive device for balance Standing balance-Leahy Scale: Poor Standing balance comment: BUE support on RW for balance, Min A to maintain safety in standing                           ADL either performed or assessed with clinical judgement   ADL Overall ADL's : Needs assistance/impaired     Grooming: Wash/dry face;Set up;Sitting;Oral care Grooming Details (indicate cue type and reason): seated in recliner         Upper Body Dressing : Maximal assistance;Sitting Upper Body Dressing Details (indicate cue type and reason): donning gown  Toilet Transfer: Minimal assistance;Moderate assistance;Rolling walker (2 wheels);Stand-pivot;Cueing for safety;Cueing for sequencing Toilet Transfer Details (indicate cue type and reason): simulated to recliner using RW and max/mod cues for technique/safety this session                Extremity/Trunk  Assessment              Vision       Perception     Praxis     Communication Communication Communication: No apparent difficulties   Cognition Arousal: Alert Behavior During Therapy: WFL for tasks assessed/performed                                 Following commands: Impaired Following commands impaired: Follows one step commands with increased time, Follows one step commands inconsistently      Cueing   Cueing Techniques: Verbal cues, Tactile cues, Gestural cues  Exercises      Shoulder Instructions       General Comments      Pertinent Vitals/ Pain       Pain Assessment Pain Assessment: Faces Faces Pain Scale: No hurt Pain Intervention(s): Monitored during session  Home Living                                          Prior Functioning/Environment              Frequency  Min 2X/week        Progress Toward Goals  OT Goals(current goals can now be found in the care plan section)  Progress towards OT goals: Progressing toward goals  Acute Rehab OT Goals OT Goal Formulation: Patient unable to participate in goal setting Time For Goal Achievement: 12/26/23 Potential to Achieve Goals: Fair  Plan      Co-evaluation                 AM-PAC OT 6 Clicks Daily Activity     Outcome Measure   Help from another person eating meals?: A Little Help from another person taking care of personal grooming?: A Little Help from another person toileting, which includes using toliet, bedpan, or urinal?: A Lot Help from another person bathing (including washing, rinsing, drying)?: A Lot Help from another person to put on and taking off regular upper body clothing?: A Lot Help from another person to put on and taking off regular lower body clothing?: A Lot 6 Click Score: 14    End of Session Equipment Utilized During Treatment: Gait belt;Rolling walker (2 wheels)  OT Visit Diagnosis: Other abnormalities of gait and  mobility (R26.89);Unsteadiness on feet (R26.81);Muscle weakness (generalized) (M62.81)   Activity Tolerance Patient tolerated treatment well   Patient Left in chair;with call bell/phone within reach;with chair alarm set   Nurse Communication Mobility status        Time: 1006-1030 OT Time Calculation (min): 24 min  Charges: OT General Charges $OT Visit: 1 Visit OT Treatments $Self Care/Home Management : 8-22 mins $Therapeutic Activity: 8-22 mins  Lorell Thibodaux, OTR/L  12/13/23, 11:02 AM   Linwood Gullikson E Shaleka Brines 12/13/2023, 10:59 AM

## 2023-12-13 NOTE — Plan of Care (Signed)
 Patient was supposed to be getting TEE this afternoon.  Report given  - Had been NPO since midnight, IV in place and Verbal consent received over phone from husband (via x2 RNs.  Just after, apparently, the son entered the room and requested to speak to the RN for update.  Just as I walked in to the room, I notices a chix leg in patient's mouth.  Food removed from patient's reach and explained plan of going  down for procedure almost immediately.  Son had brought the chix and didn't know she couldn't have it :(  Although she's only had a 1 bite prior to getting it removed - the procedure was cancelled for today (per Anethesia).  Family informed  - patient can now finish eating and will probably be NPO at midnight for the procedure tomorrow.

## 2023-12-13 NOTE — Progress Notes (Signed)
 [facility] CLINIC CARDIOLOGY PROGRESS NOTE       Patient ID: Cassandra Weiss MRN: [id number] DOB/AGE: 88-Feb-1934 88 y.o.  Admit date: 12/08/2023 Referring Physician Dr. Amaryllis Dare Primary Physician Sadie Manna, MD  Primary Cardiologist Dr. Bosie (last seen 2019) Reason for Consultation possible endocarditis  HPI: Cassandra Weiss is a 89 y.o. female  with a past medical history of persistent atrial fibrillation, hx CVA, hypertension, IDA, cognitive impairment who presented to the ED on 12/08/2023 for worsening altered mental status and hypotension. Cardiology was consulted for further evaluation.   Interval history: -Patient seen and examined this AM, resting comfortably in hospital bed.  -No complaints this AM. BP and HR stable.  -Plan for TEE later today. INR 3.5.  Review of systems complete and found to be negative unless listed above    Past Medical History:  Diagnosis Date   Arthritis    Atrial fibrillation (HCC)    Hypertension    Urge urinary incontinence     No past surgical history on file.  Medications Prior to Admission  Medication Sig Dispense Refill Last Dose/Taking   acetaminophen  (TYLENOL ) 325 MG tablet Take 2 tablets (650 mg total) by mouth every 6 (six) hours as needed for mild pain (pain score 1-3) or fever (or Fever >/= 101). 20 tablet 0 Unknown   amLODipine  (NORVASC ) 5 MG tablet Take 5 mg by mouth daily.   12/08/2023 Morning   atorvastatin  (LIPITOR) 80 MG tablet Take 80 mg by mouth daily.   12/08/2023 Morning   Calcium  Alginate-Silver  (RESTORE SILVER  DRESSING) 2X2 PADS Apply 1 Application topically daily.   12/08/2023 Morning   carvedilol  (COREG ) 25 MG tablet Take 25 mg by mouth 2 (two) times daily.   12/08/2023 Morning   furosemide (LASIX) 40 MG tablet Take 40 mg by mouth daily.   12/08/2023 Morning   gabapentin  (NEURONTIN ) 300 MG capsule Take by mouth.  1 (one) Capsule Capsule three times a day   12/08/2023 Morning   Multiple Vitamin (MULTI-VITAMIN) tablet  Take 1 tablet by mouth daily.   12/08/2023 Morning   warfarin (COUMADIN ) 1 MG tablet Take 1 mg by mouth daily.   12/08/2023 Morning   ALPRAZolam  (XANAX ) 0.5 MG tablet Take 0.5 mg by mouth at bedtime as needed for sleep.   Unknown   hydrALAZINE  (APRESOLINE ) 50 MG tablet Take 50 mg by mouth 3 (three) times daily.   Unknown   linezolid (ZYVOX) 600 MG tablet Take 600 mg by mouth 2 (two) times daily. (Patient not taking: Reported on 12/08/2023)   Not Taking   Social History   Socioeconomic History   Marital status: Married    Spouse name: Not on file   Number of children: Not on file   Years of education: Not on file   Highest education level: Not on file  Occupational History   Not on file  Tobacco Use   Smoking status: Never   Smokeless tobacco: Never  Vaping Use   Vaping status: Never Used  Substance and Sexual Activity   Alcohol  use: No   Drug use: Never   Sexual activity: Not on file  Other Topics Concern   Not on file  Social History Narrative   Lives with husband at home   Social Drivers of Health   Financial Resource Strain: Low Risk  (08/17/2023)   Received from [facility]   Overall Financial Resource Strain (CARDIA)    Difficulty of Paying Living Expenses: Not hard at all  Food  Insecurity: No Food Insecurity (12/09/2023)   Hunger Vital Sign    Worried About Running Out of Food in the Last Year: Never true    Ran Out of Food in the Last Year: Never true  Transportation Needs: No Transportation Needs (12/09/2023)   PRAPARE - Administrator, Civil Service (Medical): No    Lack of Transportation (Non-Medical): No  Physical Activity: Insufficiently Active (01/13/2018)   Exercise Vital Sign    Days of Exercise per Week: 2 days    Minutes of Exercise per Session: 10 min  Stress: No Stress Concern Present (01/13/2018)   [employer] of Occupational Health - Occupational Stress Questionnaire    Feeling of Stress : Not at all  Social  Connections: Moderately Integrated (12/09/2023)   Social Connection and Isolation Panel    Frequency of Communication with Friends and Family: Once a week    Frequency of Social Gatherings with Friends and Family: Once a week    Attends Religious Services: More than 4 times per year    Active Member of Clubs or Organizations: Yes    Attends Banker Meetings: More than 4 times per year    Marital Status: Married  Catering manager Violence: Patient Unable To Answer (12/09/2023)   Humiliation, Afraid, Rape, and Kick questionnaire    Fear of Current or Ex-Partner: Patient unable to answer    Emotionally Abused: Patient unable to answer    Physically Abused: Patient unable to answer    Sexually Abused: Patient unable to answer    Family History  Problem Relation Age of Onset   Heart disease Mother    Stroke Mother    Heart disease Father      Vitals:   12/12/23 1721 12/12/23 2016 12/13/23 0503 12/13/23 0820  BP: 122/63 125/84 96/81 (!) 112/99  Pulse: 82 95 91 84  Resp: 16 16 16 20   Temp: (!) 97.5 F (36.4 C) 98.5 F (36.9 C) 97.6 F (36.4 C) 97.8 F (36.6 C)  TempSrc:    Oral  SpO2: 99% 97% 96% 99%  Weight:        PHYSICAL EXAM General: Chronically ill-appearing elderly female, well nourished, in no acute distress. HEENT: Normocephalic and atraumatic. Neck: No JVD.  Lungs: Normal respiratory effort on room air. Clear bilaterally to auscultation. No wheezes, crackles, rhonchi.  Heart: Irregularly irregular, controlled rate. Normal S1 and S2 without gallops or murmurs.  Abdomen: Non-distended appearing.  Msk: Normal strength and tone for age. Extremities: Warm and well perfused. No clubbing, cyanosis.  No edema.  Neuro: Alert and oriented X 1. Psych: Answers questions appropriately.   Labs: Basic Metabolic Panel: Recent Labs    12/10/23 1023 12/11/23 0638 12/13/23 0522  NA 148* 144 142  K 3.1* 3.5 3.6  CL 111 110 108  CO2 26 26 24   GLUCOSE 94 114* 81   BUN 27* 20 12  CREATININE 1.15* 1.17* 1.03*  CALCIUM  8.5* 8.7* 8.4*  MG 2.1  --   --   PHOS  --  2.3*  --    Liver Function Tests: Recent Labs    12/11/23 0638  ALBUMIN 2.2*   No results for input(s): LIPASE, AMYLASE in the last 72 hours. CBC: Recent Labs    12/11/23 0638 12/13/23 0522  WBC 8.0 8.5  HGB 9.2* 9.9*  HCT 28.0* 31.6*  MCV 86.7 88.0  PLT 249 248   Cardiac Enzymes: No results for input(s): CKTOTAL, CKMB, CKMBINDEX, TROPONINIHS in the last 72  hours. BNP: No results for input(s): BNP in the last 72 hours. D-Dimer: No results for input(s): DDIMER in the last 72 hours. Hemoglobin A1C: No results for input(s): HGBA1C in the last 72 hours. Fasting Lipid Panel: No results for input(s): CHOL, HDL, LDLCALC, TRIG, CHOLHDL, LDLDIRECT in the last 72 hours. Thyroid  Function Tests: No results for input(s): TSH, T4TOTAL, T3FREE, THYROIDAB in the last 72 hours.  Invalid input(s): FREET3 Anemia Panel: No results for input(s): VITAMINB12, FOLATE, FERRITIN, TIBC, IRON, RETICCTPCT in the last 72 hours.    Radiology: ECHOCARDIOGRAM COMPLETE Result Date: 12/12/2023    ECHOCARDIOGRAM REPORT   Patient Name:   TONIE ELSEY Date of Exam: 12/12/2023 Medical Rec #:  [id number]     Height:       62.0 in Accession #:    [id number]    Weight:       131.6 lb Date of Birth:  10/15/1932     BSA:          1.600 m Patient Age:    89 years      BP:           161/71 mmHg Patient Gender: F             HR:           56 bpm. Exam Location:  [facility] Procedure: 2D Echo, Cardiac Doppler and Color Doppler (Both Spectral and Color            Flow Doppler were utilized during procedure). Indications:     Bacteremia R78.81  History:         Patient has no prior history of Echocardiogram examinations.                  Arrythmias:Atrial Fibrillation; Risk Factors:Hypertension.  Sonographer:     Christopher Furnace Referring Phys:  [id number] DONALD BERLIN Diagnosing  Phys: Keller Paterson  Sonographer Comments: Technically challenging study due to limited acoustic windows and no apical window. IMPRESSIONS  1. Technically difficult study with no apical views.  2. Left ventricular ejection fraction, by estimation, is 60 to 65%. The left ventricle has normal function. Left ventricular endocardial border not optimally defined to evaluate regional wall motion. 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 Left ventricular diastolic parameters are indeterminate.  3. Right ventricular systolic function was not well visualized. The right ventricular size is not well visualized.  4. The mitral valve is normal in structure. Trivial mitral valve regurgitation.  5. Echogenicity (0.5 cm X 0.5 cm) noted on ventricular aspect of aortic valve which can suggest vegetation vs degenerative change. Recommend TEE for further evaluation as clinically indicated. The aortic valve is tricuspid. Aortic valve regurgitation is  not visualized. Aortic valve sclerosis/calcification is present, without any evidence of aortic stenosis.  6. The inferior vena cava is normal in size with greater than 50% respiratory variability, suggesting right atrial pressure of 3 mmHg. FINDINGS  Left Ventricle: Left ventricular ejection fraction, by estimation, is 60 to 65%. The left ventricle has normal function. Left ventricular endocardial border not optimally defined to evaluate regional wall motion. The left ventricular internal cavity size was normal in size. 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 Left ventricular diastolic parameters are indeterminate. Right Ventricle: The right ventricular size is not well visualized. Right vetricular wall thickness was not well visualized. Right ventricular systolic function was not well visualized. Left Atrium: Left atrial size was not well visualized. Right Atrium:  Right atrial size was not well visualized.  Pericardium: There is no evidence of pericardial effusion. Mitral Valve: The mitral valve is normal in structure. Mild mitral annular calcification. Trivial mitral valve regurgitation. Tricuspid Valve: The tricuspid valve is normal in structure. Tricuspid valve regurgitation is mild. Aortic Valve: Echogenicity (0.5 cm X 0.5 cm) noted on ventricular aspect of aortic valve which can suggest vegetation vs degenerative change. Recommend TEE for further evaluation as clinically indicated. The aortic valve is tricuspid. Aortic valve regurgitation is not visualized. Aortic valve sclerosis/calcification is present, without any evidence of aortic stenosis. Pulmonic Valve: The pulmonic valve was not well visualized. Pulmonic valve regurgitation is trivial. Aorta: The aortic root is normal in size and structure. Venous: The inferior vena cava is normal in size with greater than 50% respiratory variability, suggesting right atrial pressure of 3 mmHg. IAS/Shunts: The interatrial septum was not well visualized.  LEFT VENTRICLE PLAX 2D LVIDd:         3.00 cm LVIDs:         2.00 cm LV PW:         1.30 cm LV IVS:        1.80 cm LVOT diam:     2.00 cm LVOT Area:     3.14 cm  LEFT ATRIUM         Index LA diam:    4.80 cm 3.00 cm/m   AORTA Ao Root diam: 3.30 cm TRICUSPID VALVE TR Peak grad:   28.1 mmHg TR Vmax:        265.00 cm/s  SHUNTS Systemic Diam: 2.00 cm Keller Paterson Electronically signed by Keller Paterson Signature Date/Time: 12/12/2023/12:47:21 PM    Final    DG Chest Port 1 View Result Date: 12/11/2023 CLINICAL DATA:  PICC line placement EXAM: PORTABLE CHEST 1 VIEW COMPARISON:  12/11/2023 FINDINGS: Left PICC line in place with the tip in the SVC. Cardiomegaly, vascular congestion. Interstitial prominence may reflect interstitial edema. No effusions. IMPRESSION: Left PICC line tip in the SVC. Cardiomegaly with vascular congestion and suspected early interstitial edema. Electronically Signed   By: Franky Crease M.D.   On:  12/11/2023 17:10   DG Chest Port 1 View Result Date: 12/11/2023 CLINICAL DATA:  PICC line placement. EXAM: PORTABLE CHEST 1 VIEW COMPARISON:  12/08/2023 FINDINGS: The cardio pericardial silhouette is enlarged. There is pulmonary vascular congestion without overt pulmonary edema. No pleural effusion or pneumothorax. Left PICC line tip overlies the region of the innominate vein confluence. Bones are diffusely demineralized with degenerative changes noted in both shoulders. IMPRESSION: Left PICC line tip overlies the region of the innominate vein confluence. Electronically Signed   By: Camellia Candle M.D.   On: 12/11/2023 08:56   DG Shoulder Right Result Date: 12/10/2023 CLINICAL DATA:  Pain EXAM: RIGHT SHOULDER - 2+ VIEW COMPARISON:  Right shoulder x-ray 10/04/2023. FINDINGS: The bones are osteopenic. There is no acute fracture or dislocation identified. There severe degenerative changes of the glenohumeral joint with marked joint space narrowing and exuberant osteophyte formation. Findings have progressed compared to prior study. Moderate degenerative changes of the acromioclavicular joint appear similar to the prior study. Soft tissues are within normal limits. IMPRESSION: 1. Progression of severe degenerative changes of the glenohumeral joint. 2. No acute fracture. Electronically Signed   By: Greig Pique M.D.   On: 12/10/2023 18:24   US  EKG SITE RITE Result Date: 12/10/2023 If Site Rite image not attached, placement could not be confirmed due to current cardiac rhythm.  CT  Renal Stone Study Result Date: 12/08/2023 CLINICAL DATA:  Abdominal/flank pain, stone suspected EXAM: CT ABDOMEN AND PELVIS WITHOUT CONTRAST TECHNIQUE: Multidetector CT imaging of the abdomen and pelvis was performed following the standard protocol without IV contrast. RADIATION DOSE REDUCTION: This exam was performed according to the departmental dose-optimization program which includes automated exposure control, adjustment of the  mA and/or kV according to patient size and/or use of iterative reconstruction technique. COMPARISON:  None Available. FINDINGS: Lower chest: Cardiomegaly. Severe atherosclerotic plaque. Trace to small left and trace right pleural effusions. Small hiatal hernia. Hepatobiliary: No focal liver abnormality. No gallstones, gallbladder wall thickening, or pericholecystic fluid. No biliary dilatation. Pancreas: Diffusely atrophic. No focal lesion. Otherwise normal pancreatic contour. No surrounding inflammatory changes. No main pancreatic ductal dilatation. Spleen: Normal in size without focal abnormality. Adrenals/Urinary Tract: No adrenal nodule bilaterally. Fluid density lesion of the right kidney likely represents simple renal cyst. Bilateral high density lesions within the kidneys likely represent proteinaceous or hemorrhagic cysts. There is 4 mm left nephrolithiasis. The urinary bladder is decompressed with Foley catheter tip and balloon terminating within its lumen. Stomach/Bowel: Stomach is within normal limits. No evidence of bowel wall thickening or dilatation. The appendix is not definitely identified with no inflammatory changes in the right lower quadrant to suggest acute appendicitis. Vascular/Lymphatic: No abdominal aorta or iliac aneurysm. Severe atherosclerotic plaque of the aorta and its branches. No abdominal, pelvic, or inguinal lymphadenopathy. Reproductive: Status post hysterectomy. No adnexal masses. Other: No intraperitoneal free fluid. No intraperitoneal free gas. No organized fluid collection. Musculoskeletal: Tiny fat containing umbilical hernia. No suspicious lytic or blastic osseous lesions. No acute displaced fracture. Marked L5-S1 disc bulge. IMPRESSION: 1. Trace to small left and trace right pleural effusions. 2. Cardiomegaly. 3. Small hiatal hernia. 4. Nonobstructive 4 mm left nephrolithiasis. 5. Marked L5-S1 disc bulge. 6. Otherwise limited evaluation on this noncontrast study. 7.  Aortic  Atherosclerosis (ICD10-I70.0). Electronically Signed   By: Morgane  Naveau M.D.   On: 12/08/2023 17:56   CT HEAD WO CONTRAST ( ) Result Date: 12/08/2023 CLINICAL DATA:  Altered level of consciousness, hypotension EXAM: CT HEAD WITHOUT CONTRAST TECHNIQUE: Contiguous axial images were obtained from the base of the skull through the vertex without intravenous contrast. RADIATION DOSE REDUCTION: This exam was performed according to the departmental dose-optimization program which includes automated exposure control, adjustment of the mA and/or kV according to patient size and/or use of iterative reconstruction technique. COMPARISON:  10/04/2023 FINDINGS: Brain: Stable hypodensities within the periventricular white matter and basal ganglia, compatible with chronic small vessel ischemic changes. No evidence of acute infarct or hemorrhage. Lateral ventricles and midline structures are unremarkable. No acute extra-axial fluid collections. No mass effect. Vascular: No hyperdense vessel or unexpected calcification. Skull: Normal. Negative for fracture or focal lesion. Sinuses/Orbits: Opacification of the left sphenoid sinus. Polypoid mucosal thickening right maxillary sinus. No gas fluid levels. Other: None. IMPRESSION: 1. Stable chronic small vessel ischemic changes. No acute intracranial process. 2. Paranasal sinus disease as above. Electronically Signed   By: Ozell Daring M.D.   On: 12/08/2023 17:47   DG Chest Portable 1 View Result Date: 12/08/2023 CLINICAL DATA:  AMS EXAM: PORTABLE CHEST - 1 VIEW COMPARISON:  10/04/2023 FINDINGS: No focal airspace consolidation, pleural effusion, or pneumothorax. Moderate cardiomegaly. Tortuous aorta with aortic atherosclerosis. No acute fracture or destructive lesions. Multilevel thoracic osteophytosis. Advanced degenerative changes of both shoulders. IMPRESSION: No acute cardiopulmonary abnormality. Electronically Signed   By: Rogelia Myers M.D.   On: 12/08/2023 15:34  ECHO as above  TELEMETRY reviewed by me 12/13/2023: not on tele  EKG reviewed by me: atrial fibrillation rate 92 bpm  Data reviewed by me 12/13/2023: last 24h vitals tele labs imaging I/O ED provider note, admission H&P, hospitalist progress note  Principal Problem:   Sepsis secondary to UTI [facility]) Active Problems:   Altered mental status   AKI (acute kidney injury)   AMS (altered mental status)   Acute urinary tract infection   Benign essential HTN   Coronary artery disease   Hyperlipidemia   Paroxysmal A-fib (HCC)   Iron deficiency anemia   Hypernatremia   MSSA bacteremia   Acute metabolic encephalopathy   Hypercoagulable state   Urge urinary incontinence   Hypokalemia    ASSESSMENT AND PLAN:  Cassandra Weiss is a 89 y.o. female  with a past medical history of persistent atrial fibrillation, hx CVA, hypertension, IDA, cognitive impairment who presented to the ED on 12/08/2023 for worsening altered mental status and hypotension. Cardiology was consulted for further evaluation.   # MSSA bacteremia # Urosepsis, AKI # Abnormal echocardiogram # Persistent atrial fibrillation # Hypertension # Hyperlipidemia Patient was brought to the ED due to concerns for altered mental status and hypotension.  Has a chronic indwelling Foley catheter and was found to have UTI and sepsis.  Blood cultures positive for MSSA.  Echo today with EF 60-65%, severe asymmetric LVH, echogenicity 0.5 cm x 0.5 cm noted on ventricular aspect of aortic valve (vegetation versus degenerative change). -Plan for TEE today for further evaluation of possible endocarditis. NPO at midnight.  Consent will need to be signed by patient's husband due to her baseline cognitive impairment. -Warfarin as per pharmacy.  INR supratherapeutic today at 3.3.  Will monitor closely, would not proceed with TEE if INR greater than 3.8. -Further management of UTI, sepsis, bacteremia as per primary team. -Continue amlodipine  10 mg  daily, carvedilol  25 mg twice daily, hydralazine  50 mg 3 times daily. -Continue atorvastatin  80 mg daily.   This patient's plan of care was discussed and created with Dr. Wilburn and he is in agreement.  Signed: Danita Bloch, PA-C  12/13/2023, 8:56 AM [facility]

## 2023-12-13 NOTE — Progress Notes (Signed)
 Palliative Care Progress Note, Assessment & Plan   Patient Name: Cassandra Weiss       Date: 12/13/2023 DOB: 05-09-1932  Age: 89 y.o. MRN#: [id number] Attending Physician: Cassandra Qualia, MD Primary Care Physician: Cassandra Manna, MD Admit Date: 12/08/2023  Subjective: Patient is sitting up in bed, awake.  She does not open her eyes or acknowledge my presence.  Her son Cassandra Weiss his wife, and [name] are at bedside during my visit.  HPI: Cassandra Weiss is a 89 y.o. female with medical history significant of atrial fibrillation, hypertension, urge incontinence, hyperlipidemia coronary artery disease, iron deficiency anemia who was brought to the ER with altered mental status and hypotension.  Patient apparently has been less responsive for about 2 days.  Was also found to be hypotensive at home was blood pressure 88/40.  Patient has chronic indwelling catheter.     She was found to have met sepsis criteria with hypothermia temperature 95.6, blood pressure is 93/42 here she has a sodium of 156 hemoglobin 9.3 chloride 115, BUN is 50 creatinine 1.71.  Patient is on warfarin and INR is 8.8 glucose 104.  Acute viral screen is negative.  Analysis positive for UTI.  Patient also had CT renal stone that showed nonobstructive stone otherwise.  Patient is being admitted for sepsis due to UTI.   PMT was consulted to support patient and family with goals of care discussions.  Summary of counseling/coordination of care: Extensive chart review completed prior to meeting patient including labs, vital signs, imaging, progress notes, orders, and available advanced directive documents from current and previous encounters.   After reviewing the patient's chart and assessing the patient at bedside, patient remains  unable to participate in goals of care or medical decision making independently at this time.  Therefore, entirety of discussion was held with patient's son, husband at bedside.  I spoke with patient/family in regards to symptom management and goals of care.  Cassandra Weiss shares that he was unaware that his mother was unable to eat and drink today.  Discussed that TEE cannot be performed if patient has had anything to eat or drink given risk for aspiration and other complications with anesthesia.  He endorsed understanding.  Additionally, discussed that results of TEE will help guide next steps.  Cassandra Weiss shares that he is hopeful his mother can return home if she requires additional PT/OT only.  However, he is aware of the potential for long-term antibiotics or other medical interventions that may require her to be at [facility].  He is hopeful to avoid rehab given his experience was not positive.  However, he remains open to the idea that that is what is needed for his mother.  Education provided on TEE, n.p.o. status.  Cassandra Weiss shares appreciation for our visit.  PMT will continue to follow and support.  Plan for TEE tomorrow and will follow-up once results are known to continue goals of care discussions.  Physical Exam Vitals reviewed.  Constitutional:      General: She is not in acute distress.    Appearance: She is normal weight.  HENT:     Head: Normocephalic.     Mouth/Throat:     Mouth: Mucous  membranes are moist.  Eyes:     Pupils: Pupils are equal, round, and reactive to light.  Pulmonary:     Effort: Pulmonary effort is normal.  Abdominal:     Palpations: Abdomen is soft.  Skin:    General: Skin is warm and dry.  Neurological:     Mental Status: She is alert.  Psychiatric:        Mood and Affect: Mood normal.        Behavior: Behavior normal.             Visit includes: Detailed review of medical records (labs, imaging, vital signs), medically appropriate exam (mental status,  respiratory, cardiac, skin), discussed with treatment team, counseling and educating patient, family and staff, documenting clinical information, medication management and coordination of care.  Cassandra L. Arvid, DNP, FNP-BC Palliative Medicine Team

## 2023-12-14 ENCOUNTER — Inpatient Hospital Stay: Admit: 2023-12-14 | Discharge: 2023-12-14 | Disposition: A | Discharge status: Home / Self Care | Attending: Student

## 2023-12-14 ENCOUNTER — Inpatient Hospital Stay: Payer: Self-pay | Admitting: Anesthesiology

## 2023-12-14 ENCOUNTER — Encounter
Admission: EM | Disposition: A | Discharge status: Home / Self Care | Payer: Self-pay | Source: Home / Self Care | Attending: Internal Medicine

## 2023-12-14 DIAGNOSIS — A419 Sepsis, unspecified organism: Secondary | ICD-10-CM | POA: Diagnosis not present

## 2023-12-14 DIAGNOSIS — N390 Urinary tract infection, site not specified: Secondary | ICD-10-CM | POA: Diagnosis not present

## 2023-12-14 HISTORY — PX: TEE WITHOUT CARDIOVERSION: SHX5443

## 2023-12-14 LAB — BASIC METABOLIC PANEL WITH GFR
Anion gap: 12 (ref 5–15)
BUN: 11 mg/dL (ref 8–23)
CO2: 24 mmol/L (ref 22–32)
Calcium: 8.6 mg/dL — ABNORMAL LOW (ref 8.9–10.3)
Chloride: 107 mmol/L (ref 98–111)
Creatinine, Ser: 1.05 mg/dL — ABNORMAL HIGH (ref 0.44–1.00)
GFR, Estimated: 51 mL/min — ABNORMAL LOW (ref >60)
Glucose, Bld: 99 mg/dL (ref 70–99)
Potassium: 3.3 mmol/L — ABNORMAL LOW (ref 3.5–5.1)
Sodium: 143 mmol/L (ref 135–145)

## 2023-12-14 LAB — CBC
HCT: 28.8 % — ABNORMAL LOW (ref 36.0–46.0)
Hemoglobin: 9.1 g/dL — ABNORMAL LOW (ref 12.0–15.0)
MCH: 27.7 pg (ref 26.0–34.0)
MCHC: 31.6 g/dL (ref 30.0–36.0)
MCV: 87.8 fL (ref 80.0–100.0)
Platelets: 217 K/uL (ref 150–400)
RBC: 3.28 MIL/uL — ABNORMAL LOW (ref 3.87–5.11)
RDW: 22.5 % — ABNORMAL HIGH (ref 11.5–15.5)
WBC: 8.3 K/uL (ref 4.0–10.5)
nRBC: 0 % (ref 0.0–0.2)

## 2023-12-14 LAB — ECHO TEE

## 2023-12-14 LAB — PROTIME-INR
INR: 3.2 — ABNORMAL HIGH (ref 0.8–1.2)
Prothrombin Time: 33.8 s — ABNORMAL HIGH (ref 11.4–15.2)

## 2023-12-14 SURGERY — ECHOCARDIOGRAM, TRANSESOPHAGEAL
Anesthesia: General

## 2023-12-14 MED ORDER — DROPERIDOL 2.5 MG/ML IJ SOLN: 0.6250 mg | Freq: Once | INTRAMUSCULAR | Status: DC | PRN

## 2023-12-14 MED ORDER — SODIUM CHLORIDE 0.9 % IV SOLN: INTRAVENOUS | Status: AC

## 2023-12-14 MED ORDER — LIDOCAINE VISCOUS HCL 2 % MT SOLN
OROMUCOSAL | Status: AC
  Filled 2023-12-14: qty 15

## 2023-12-14 MED ORDER — PROPOFOL 500 MG/50ML IV EMUL
INTRAVENOUS | Status: DC | PRN
  Administered 2023-12-14 (×2): 20 mg via INTRAVENOUS
  Administered 2023-12-14: 30 mg via INTRAVENOUS

## 2023-12-14 MED ORDER — BUTAMBEN-TETRACAINE-BENZOCAINE 2-2-14 % EX AERO
INHALATION_SPRAY | CUTANEOUS | Status: AC
  Filled 2023-12-14: qty 5

## 2023-12-14 MED ORDER — OXYCODONE HCL 5 MG PO TABS: 5.0000 mg | ORAL_TABLET | Freq: Once | ORAL | Status: DC | PRN

## 2023-12-14 MED ORDER — FENTANYL CITRATE (PF) 100 MCG/2ML IJ SOLN: 25.0000 ug | INTRAMUSCULAR | Status: DC | PRN

## 2023-12-14 MED ORDER — OXYCODONE HCL 5 MG/5ML PO SOLN: 5.0000 mg | Freq: Once | ORAL | Status: DC | PRN

## 2023-12-14 MED ORDER — ACETAMINOPHEN 10 MG/ML IV SOLN: 1000.0000 mg | Freq: Once | INTRAVENOUS | Status: DC | PRN

## 2023-12-14 NOTE — Plan of Care (Signed)
  Problem: Fluid Volume: Goal: Hemodynamic stability will improve Outcome: Progressing   Problem: Clinical Measurements: Goal: Diagnostic test results will improve Outcome: Progressing Goal: Signs and symptoms of infection will decrease Outcome: Progressing   Problem: Respiratory: Goal: Ability to maintain adequate ventilation will improve Outcome: Progressing   Problem: Education: Goal: Knowledge of General Education information will improve Description: Including pain rating scale, medication(s)/side effects and non-pharmacologic comfort measures Outcome: Not Progressing   Problem: Health Behavior/Discharge Planning: Goal: Ability to manage health-related needs will improve Outcome: Not Progressing   Problem: Clinical Measurements: Goal: Ability to maintain clinical measurements within normal limits will improve Outcome: Progressing Goal: Will remain free from infection Outcome: Progressing Goal: Diagnostic test results will improve Outcome: Progressing Goal: Respiratory complications will improve Outcome: Progressing Goal: Cardiovascular complication will be avoided Outcome: Progressing   Problem: Activity: Goal: Risk for activity intolerance will decrease Outcome: Not Progressing   Problem: Nutrition: Goal: Adequate nutrition will be maintained Outcome: Not Progressing   Problem: Elimination: Goal: Will not experience complications related to bowel motility Outcome: Progressing Goal: Will not experience complications related to urinary retention Outcome: Progressing   Problem: Pain Managment: Goal: General experience of comfort will improve and/or be controlled Outcome: Progressing   Problem: Safety: Goal: Ability to remain free from injury will improve Outcome: Progressing   Problem: Skin Integrity: Goal: Risk for impaired skin integrity will decrease Outcome: Progressing

## 2023-12-14 NOTE — Progress Notes (Signed)
 Progress Note    Cassandra Weiss  FMW:[id number] DOB: 1932/12/27  DOA: 12/08/2023 PCP: Sadie Manna, MD      Brief Narrative:    Medical records reviewed and are as summarized below:  Cassandra Weiss is a 89 y.o. female  with medical history significant of atrial fibrillation, hypertension, urge incontinence, chronic indwelling Foley catheter, hyperlipidemia coronary artery disease, history of stroke, iron deficiency anemia who was brought to the ER with altered mental status (less responsive) and hypotension.   Vital signs in the ED: Temperature 96.3 F and 95.6 F (per rectum), respiratory rate 24, pulse 85, BP 116/64 and dropped to 93/42, O2 sat 98% on room air.  Diagnostic data significant for sodium 156, BUN 50, creatinine 1.71, albumin 2.7, WBC 8.5, hemoglobin 9.3, platelet count 334, INR 8.8, urinalysis positive for UTI.  Chest x-ray and CT head did not show any acute abnormality.  CT renal stone: IMPRESSION: 1. Trace to small left and trace right pleural effusions. 2. Cardiomegaly. 3. Small hiatal hernia. 4. Nonobstructive 4 mm left nephrolithiasis. 5. Marked L5-S1 disc bulge. 6. Otherwise limited evaluation on this noncontrast study. 7.  Aortic Atherosclerosis (ICD10-I70.0).    She was admitted to the hospital for hypernatremia, Coumadin  coagulopathy with supratherapeutic INR, sepsis secondary to acute UTI.       Assessment/Plan:   Principal Problem:   Sepsis secondary to UTI [facility]) Active Problems:   Acute urinary tract infection   MSSA bacteremia   AKI (acute kidney injury)   Hypernatremia   Acute metabolic encephalopathy   Benign essential HTN   Paroxysmal A-fib (HCC)   Hypercoagulable state   Coronary artery disease   Iron deficiency anemia   Hyperlipidemia   Urge urinary incontinence   Altered mental status   AMS (altered mental status)   Hypokalemia   Nutrition Problem: Increased nutrient needs Etiology: wound  healing  Signs/Symptoms: estimated needs   Body mass index is 24.07 kg/m.   Sepsis secondary to acute UTI, MSSA bacteremia: Urine culture showed pansensitive E. coli.  Blood culture showed MSSA.  No growth on repeat blood cultures from 12/11/2022. 2D echo showed preserved EF. S/p TEE on 12/14/2023 did not show any vegetations. Continue IV Ancef .  Follow-up with ID for further recommendations.   AKI: Improved Hypernatremia: Improved Hypokalemia: Replete potassium and monitor levels   Acute metabolic encephalopathy: Improved   Paroxysmal atrial fibrillation, history of stroke: Warfarin on hold Warfarin coagulopathy: Improved.  INR went up as high as 10.1.   Comorbidities include CAD, hyperlipidemia, urge incontinence, chronic indwelling Foley catheter   General Weakness: PT recommended discharge to SNF.  However, Cassandra Weiss, son, prefers that patient be discharged home.  Consult TOC to set up home health therapy.  Diet Order             Diet NPO time specified  Diet effective midnight                                  Consultants: Cardiologist ID specialist  Procedures: TEE on 12/14/2023    Medications:    amLODipine   5 mg Oral Daily   vitamin C   500 mg Oral BID   atorvastatin   80 mg Oral Daily   carvedilol   25 mg Oral BID   Chlorhexidine  Gluconate Cloth  6 each Topical Daily   feeding supplement  237 mL Oral TID BM   gabapentin   300 mg Oral  TID   hydrALAZINE   50 mg Oral TID   lidocaine   1 patch Transdermal Q24H   multivitamin with minerals  1 tablet Oral Daily   sodium chloride  flush  10-40 mL Intracatheter Q12H   Warfarin - Pharmacist Dosing Inpatient   Does not apply q1600   zinc  sulfate (50mg  elemental zinc )  220 mg Oral Daily   Continuous Infusions:   ceFAZolin  (ANCEF ) IV 2 g (12/14/23 0607)     Anti-infectives (From admission, onward)    Start     Dose/Rate Route Frequency Ordered Stop   12/09/23 2200  levofloxacin  (LEVAQUIN ) IVPB  500 mg  Status:  Discontinued        500 mg 100 mL/hr over 60 Minutes Intravenous Every 24 hours 12/09/23 2123 12/11/23 1329   12/09/23 1800  cefTRIAXone  (ROCEPHIN ) 2 g in sodium chloride  0.9 % 100 mL IVPB  Status:  Discontinued        2 g 200 mL/hr over 30 Minutes Intravenous Every 24 hours 12/08/23 1907 12/09/23 1626   12/09/23 1800  ceFAZolin  (ANCEF ) IVPB 2g/100 mL premix        2 g 200 mL/hr over 30 Minutes Intravenous Every 8 hours 12/09/23 1626     12/08/23 1745  cefTRIAXone  (ROCEPHIN ) 2 g in sodium chloride  0.9 % 100 mL IVPB        2 g 200 mL/hr over 30 Minutes Intravenous Once 12/08/23 1730 12/08/23 1821              Family Communication/Anticipated D/C date and plan/Code Status   DVT prophylaxis:      Code Status: Do not attempt resuscitation (DNR) PRE-ARREST INTERVENTIONS DESIRED  Family Communication: Plan discussed with Cassandra Weiss, son, over the phone Disposition Plan: Plan to discharge home   Status is: Inpatient Remains inpatient appropriate because: MSSA bacteremia       Subjective:   Interval events noted.  She has no complaints.  Objective:    Vitals:   12/13/23 2041 12/13/23 2100 12/14/23 0435 12/14/23 0814  BP: (!) 135/35 136/63 (!) 127/111 128/60  Pulse: 64 81 79 71  Resp: 16 18 16 18   Temp: 98 F (36.7 C) 98.2 F (36.8 C) 98.3 F (36.8 C) 98.2 F (36.8 C)  TempSrc: Oral Oral Oral Oral  SpO2: 95% 100% 100% 96%  Weight:       No data found.   Intake/Output Summary (Last 24 hours) at 12/14/2023 1129 Last data filed at 12/14/2023 9081 Gross per 24 hour  Intake 1465.41 ml  Output 650 ml  Net 815.41 ml   Filed Weights   12/11/23 1101  Weight: 59.7 kg    Exam:  GEN: NAD SKIN: Warm and dry EYES: No pallor or icterus ENT: MMM CV: RRR PULM: CTA B ABD: soft, ND, NT, +BS CNS: AAO x 1 (person), slurred speech at baseline, non focal EXT: No edema or tenderness    Wound 12/09/23 1700 Pressure Injury Coccyx Medial  Unstageable - Full thickness tissue loss in which the base of the injury is covered by slough (yellow, tan, gray, green or brown) and/or eschar (tan, brown or black) in the wound bed. (Active)     Wound 12/09/23 1700 Pressure Injury Heel Left Unstageable - Full thickness tissue loss in which the base of the injury is covered by slough (yellow, tan, gray, green or brown) and/or eschar (tan, brown or black) in the wound bed. (Active)     Wound 12/13/23 0718 Pressure Injury Sacrum Medial Stage 2 -  Partial thickness loss of dermis presenting as a shallow open injury with a red, pink wound bed without slough. (Active)     Data Reviewed:   I have personally reviewed following labs and imaging studies:  Labs: Labs show the following:   Basic Metabolic Panel: Recent Labs  Lab 12/09/23 0520 12/10/23 1023 12/11/23 0638 12/13/23 0522 12/14/23 0632  NA 155* 148* 144 142 143  K 3.9 3.1* 3.5 3.6 3.3*  CL 120* 111 110 108 107  CO2 25 26 26 24 24   GLUCOSE 99 94 114* 81 99  BUN 43* 27* 20 12 11   CREATININE 1.40* 1.15* 1.17* 1.03* 1.05*  CALCIUM  8.9 8.5* 8.7* 8.4* 8.6*  MG  --  2.1  --   --   --   PHOS  --   --  2.3*  --   --    GFR Estimated Creatinine Clearance: 28.7 mL/min (A) (by C-G formula based on SCr of 1.05 mg/dL (H)). Liver Function Tests: Recent Labs  Lab 12/08/23 1515 12/09/23 0520 12/11/23 0638  AST 24 21  --   ALT 14 13  --   ALKPHOS 48 49  --   BILITOT 0.8 1.0  --   PROT 6.5 6.2*  --   ALBUMIN 2.7* 2.6* 2.2*   Recent Labs  Lab 12/08/23 1515  LIPASE 49   No results for input(s): AMMONIA in the last 168 hours. Coagulation profile Recent Labs  Lab 12/10/23 0639 12/11/23 9361 12/12/23 0504 12/13/23 0522 12/14/23 0632  INR 1.7* 2.8* 3.3* 3.5* 3.2*    CBC: Recent Labs  Lab 12/08/23 1515 12/09/23 0520 12/10/23 1023 12/11/23 0638 12/13/23 0522 12/14/23 0632  WBC 8.5 8.8 7.8 8.0 8.5 8.3  NEUTROABS 6.5  --   --   --   --   --   HGB 9.3* 7.9* 7.2*  9.2* 9.9* 9.1*  HCT 31.1* 27.0* 23.7* 28.0* 31.6* 28.8*  MCV 89.1 90.0 88.8 86.7 88.0 87.8  PLT 334 296 242 249 248 217   Cardiac Enzymes: No results for input(s): CKTOTAL, CKMB, CKMBINDEX, TROPONINI in the last 168 hours. BNP (last 3 results) No results for input(s): PROBNP in the last 8760 hours. CBG: Recent Labs  Lab 12/10/23 1425  GLUCAP 100*   D-Dimer: No results for input(s): DDIMER in the last 72 hours. Hgb A1c: No results for input(s): HGBA1C in the last 72 hours. Lipid Profile: No results for input(s): CHOL, HDL, LDLCALC, TRIG, CHOLHDL, LDLDIRECT in the last 72 hours. Thyroid  function studies: No results for input(s): TSH, T4TOTAL, T3FREE, THYROIDAB in the last 72 hours.  Invalid input(s): FREET3 Anemia work up: No results for input(s): VITAMINB12, FOLATE, FERRITIN, TIBC, IRON, RETICCTPCT in the last 72 hours. Sepsis Labs: Recent Labs  Lab 12/08/23 1717 12/09/23 0520 12/10/23 1023 12/11/23 9361 12/13/23 0522 12/14/23 0632  WBC  --    < > 7.8 8.0 8.5 8.3  LATICACIDVEN 1.7  --   --   --   --   --    < > = values in this interval not displayed.    Microbiology Recent Results (from the past 240 hours)  Resp panel by RT-PCR (RSV, Flu A&B, Covid) Anterior Nasal Swab     Status: None   Collection Time: 12/08/23  2:56 PM   Specimen: Anterior Nasal Swab  Result Value Ref Range Status   SARS Coronavirus 2 by RT PCR NEGATIVE NEGATIVE Final    Comment: (NOTE) SARS-CoV-2 target nucleic acids are NOT DETECTED.  The  SARS-CoV-2 RNA is generally detectable in upper respiratory specimens during the acute phase of infection. The lowest concentration of SARS-CoV-2 viral copies this assay can detect is 138 copies/mL. A negative result does not preclude SARS-Cov-2 infection and should not be used as the sole basis for treatment or other patient management decisions. A negative result may occur with  improper specimen  collection/handling, submission of specimen other than nasopharyngeal swab, presence of viral mutation(s) within the areas targeted by this assay, and inadequate number of viral copies(<138 copies/mL). A negative result must be combined with clinical observations, patient history, and epidemiological information. The expected result is Negative.  Fact Sheet for Patients:  BloggerCourse.com  Fact Sheet for Healthcare Providers:  SeriousBroker.it  This test is no t yet approved or cleared by the [place]  FDA and  has been authorized for detection and/or diagnosis of SARS-CoV-2 by FDA under an Emergency Use Authorization (EUA). This EUA will remain  in effect (meaning this test can be used) for the duration of the COVID-19 declaration under Section 564(b)(1) of the Act, 21 U.S.C.section 360bbb-3(b)(1), unless the authorization is terminated  or revoked sooner.       Influenza A by PCR NEGATIVE NEGATIVE Final   Influenza B by PCR NEGATIVE NEGATIVE Final    Comment: (NOTE) The Xpert Xpress SARS-CoV-2/FLU/RSV plus assay is intended as an aid in the diagnosis of influenza from Nasopharyngeal swab specimens and should not be used as a sole basis for treatment. Nasal washings and aspirates are unacceptable for Xpert Xpress SARS-CoV-2/FLU/RSV testing.  Fact Sheet for Patients: BloggerCourse.com  Fact Sheet for Healthcare Providers: SeriousBroker.it  This test is not yet approved or cleared by the [place]  FDA and has been authorized for detection and/or diagnosis of SARS-CoV-2 by FDA under an Emergency Use Authorization (EUA). This EUA will remain in effect (meaning this test can be used) for the duration of the COVID-19 declaration under Section 564(b)(1) of the Act, 21 U.S.C. section 360bbb-3(b)(1), unless the authorization is terminated or revoked.     Resp Syncytial  Virus by PCR NEGATIVE NEGATIVE Final    Comment: (NOTE) Fact Sheet for Patients: BloggerCourse.com  Fact Sheet for Healthcare Providers: SeriousBroker.it  This test is not yet approved or cleared by the [place]  FDA and has been authorized for detection and/or diagnosis of SARS-CoV-2 by FDA under an Emergency Use Authorization (EUA). This EUA will remain in effect (meaning this test can be used) for the duration of the COVID-19 declaration under Section 564(b)(1) of the Act, 21 U.S.C. section 360bbb-3(b)(1), unless the authorization is terminated or revoked.  Performed at [facility], [address] [place], [place] [address]   Urine Culture (for pregnant, neutropenic or urologic patients or patients with an indwelling urinary catheter)     Status: Abnormal   Collection Time: 12/08/23  4:33 PM   Specimen: Urine, Catheterized  Result Value Ref Range Status   Specimen Description   Final    URINE, CATHETERIZED Performed at [facility], [address] [place], [place] [address]    Special Requests   Final    NONE Performed at [facility], [address] [place], [place] [address]    Culture >=100,000 COLONIES/mL ESCHERICHIA COLI (A)  Final   Report Status 12/11/2023 FINAL  Final   Organism ID, Bacteria ESCHERICHIA COLI (A)  Final      Susceptibility   Escherichia coli - MIC*    AMPICILLIN 4 SENSITIVE Sensitive     CEFAZOLIN  (URINE)  Value in next row Sensitive      <=1 SENSITIVEThis is a modified FDA-approved test that has been validated and its performance characteristics determined by the reporting laboratory.  This laboratory is certified under the Clinical Laboratory Improvement Amendments CLIA as qualified to perform high complexity clinical laboratory testing.    CEFEPIME Value in next row Sensitive      <=1 SENSITIVEThis is a modified FDA-approved test that has been validated and its  performance characteristics determined by the reporting laboratory.  This laboratory is certified under the Clinical Laboratory Improvement Amendments CLIA as qualified to perform high complexity clinical laboratory testing.    ERTAPENEM Value in next row Sensitive      <=1 SENSITIVEThis is a modified FDA-approved test that has been validated and its performance characteristics determined by the reporting laboratory.  This laboratory is certified under the Clinical Laboratory Improvement Amendments CLIA as qualified to perform high complexity clinical laboratory testing.    CEFTRIAXONE  Value in next row Sensitive      <=1 SENSITIVEThis is a modified FDA-approved test that has been validated and its performance characteristics determined by the reporting laboratory.  This laboratory is certified under the Clinical Laboratory Improvement Amendments CLIA as qualified to perform high complexity clinical laboratory testing.    CIPROFLOXACIN Value in next row Sensitive      <=1 SENSITIVEThis is a modified FDA-approved test that has been validated and its performance characteristics determined by the reporting laboratory.  This laboratory is certified under the Clinical Laboratory Improvement Amendments CLIA as qualified to perform high complexity clinical laboratory testing.    GENTAMICIN Value in next row Sensitive      <=1 SENSITIVEThis is a modified FDA-approved test that has been validated and its performance characteristics determined by the reporting laboratory.  This laboratory is certified under the Clinical Laboratory Improvement Amendments CLIA as qualified to perform high complexity clinical laboratory testing.    NITROFURANTOIN Value in next row Sensitive      <=1 SENSITIVEThis is a modified FDA-approved test that has been validated and its performance characteristics determined by the reporting laboratory.  This laboratory is certified under the Clinical Laboratory Improvement Amendments CLIA as  qualified to perform high complexity clinical laboratory testing.    TRIMETH/SULFA Value in next row Sensitive      <=1 SENSITIVEThis is a modified FDA-approved test that has been validated and its performance characteristics determined by the reporting laboratory.  This laboratory is certified under the Clinical Laboratory Improvement Amendments CLIA as qualified to perform high complexity clinical laboratory testing.    AMPICILLIN/SULBACTAM Value in next row Sensitive      <=1 SENSITIVEThis is a modified FDA-approved test that has been validated and its performance characteristics determined by the reporting laboratory.  This laboratory is certified under the Clinical Laboratory Improvement Amendments CLIA as qualified to perform high complexity clinical laboratory testing.    PIP/TAZO Value in next row Sensitive      <=4 SENSITIVEThis is a modified FDA-approved test that has been validated and its performance characteristics determined by the reporting laboratory.  This laboratory is certified under the Clinical Laboratory Improvement Amendments CLIA as qualified to perform high complexity clinical laboratory testing.    MEROPENEM Value in next row Sensitive      <=4 SENSITIVEThis is a modified FDA-approved test that has been validated and its performance characteristics determined by the reporting laboratory.  This laboratory is certified under the Clinical Laboratory Improvement Amendments CLIA as qualified to perform high complexity  clinical laboratory testing.    * >=100,000 COLONIES/mL ESCHERICHIA COLI  Blood culture (routine x 2)     Status: Abnormal   Collection Time: 12/08/23  5:17 PM   Specimen: BLOOD  Result Value Ref Range Status   Specimen Description   Final    BLOOD BLOOD RIGHT FOREARM Performed at [facility], [address] [place], [place] [address]    Special Requests   Final    BOTTLES DRAWN AEROBIC AND ANAEROBIC Blood Culture results may not be optimal due to an  inadequate volume of blood received in culture bottles Performed at [facility], [address] [place], [place] [address]    Culture  Setup Time   Final    GRAM POSITIVE COCCI ANAEROBIC BOTTLE ONLY Organism ID to follow CRITICAL RESULT CALLED TO, READ BACK BY AND VERIFIED WITHBETHA MAFFUCCI [facility] AT 1042 12/09/23 RAM Performed at [facility], [address], [place], [place] [address]    Culture STAPHYLOCOCCUS AUREUS (A)  Final   Report Status 12/11/2023 FINAL  Final   Organism ID, Bacteria STAPHYLOCOCCUS AUREUS  Final      Susceptibility   Staphylococcus aureus - MIC*    CIPROFLOXACIN <=0.5 SENSITIVE Sensitive     ERYTHROMYCIN <=0.25 SENSITIVE Sensitive     GENTAMICIN <=0.5 SENSITIVE Sensitive     OXACILLIN <=0.25 SENSITIVE Sensitive     TETRACYCLINE <=1 SENSITIVE Sensitive     VANCOMYCIN 1 SENSITIVE Sensitive     TRIMETH/SULFA <=10 SENSITIVE Sensitive     CLINDAMYCIN <=0.25 SENSITIVE Sensitive     RIFAMPIN <=0.5 SENSITIVE Sensitive     Inducible Clindamycin NEGATIVE Sensitive     LINEZOLID 2 SENSITIVE Sensitive     * STAPHYLOCOCCUS AUREUS  Blood Culture ID Panel (Reflexed)     Status: Abnormal   Collection Time: 12/08/23  5:17 PM  Result Value Ref Range Status   Enterococcus faecalis NOT DETECTED NOT DETECTED Final   Enterococcus Faecium NOT DETECTED NOT DETECTED Final   Listeria monocytogenes NOT DETECTED NOT DETECTED Final   Staphylococcus species DETECTED (A) NOT DETECTED Final    Comment: CRITICAL RESULT CALLED TO, READ BACK BY AND VERIFIED WITH: MAFFUCCI CLOSE AT 1042 12/09/23 RAM    Staphylococcus aureus (BCID) DETECTED (A) NOT DETECTED Final    Comment: CRITICAL RESULT CALLED TO, READ BACK BY AND VERIFIED WITH: MAFFUCCI CLOSE AT 1042 12/09/23 RAM    Staphylococcus epidermidis NOT DETECTED NOT DETECTED Final   Staphylococcus lugdunensis NOT DETECTED NOT DETECTED Final   Streptococcus species NOT DETECTED NOT DETECTED Final   Streptococcus  agalactiae NOT DETECTED NOT DETECTED Final   Streptococcus pneumoniae NOT DETECTED NOT DETECTED Final   Streptococcus pyogenes NOT DETECTED NOT DETECTED Final   A.calcoaceticus-baumannii NOT DETECTED NOT DETECTED Final   Bacteroides fragilis NOT DETECTED NOT DETECTED Final   Enterobacterales NOT DETECTED NOT DETECTED Final   Enterobacter cloacae complex NOT DETECTED NOT DETECTED Final   Escherichia coli NOT DETECTED NOT DETECTED Final   Klebsiella aerogenes NOT DETECTED NOT DETECTED Final   Klebsiella oxytoca NOT DETECTED NOT DETECTED Final   Klebsiella pneumoniae NOT DETECTED NOT DETECTED Final   Proteus species NOT DETECTED NOT DETECTED Final   Salmonella species NOT DETECTED NOT DETECTED Final   Serratia marcescens NOT DETECTED NOT DETECTED Final   Haemophilus influenzae NOT DETECTED NOT DETECTED Final   Neisseria meningitidis NOT DETECTED NOT DETECTED Final   Pseudomonas aeruginosa NOT DETECTED NOT DETECTED Final   Stenotrophomonas maltophilia NOT DETECTED NOT DETECTED Final  Candida albicans NOT DETECTED NOT DETECTED Final   Candida auris NOT DETECTED NOT DETECTED Final   Candida glabrata NOT DETECTED NOT DETECTED Final   Candida krusei NOT DETECTED NOT DETECTED Final   Candida parapsilosis NOT DETECTED NOT DETECTED Final   Candida tropicalis NOT DETECTED NOT DETECTED Final   Cryptococcus neoformans/gattii NOT DETECTED NOT DETECTED Final   Meth resistant mecA/C and MREJ NOT DETECTED NOT DETECTED Final    Comment: Performed at [facility], [address] [place], [place] [address]  Blood culture (routine x 2)     Status: None   Collection Time: 12/08/23  7:08 PM   Specimen: BLOOD  Result Value Ref Range Status   Specimen Description BLOOD BLOOD LEFT ARM  Final   Special Requests   Final    BOTTLES DRAWN AEROBIC AND ANAEROBIC Blood Culture adequate volume   Culture   Final    NO GROWTH 5 DAYS Performed at [facility], [address] [place], [place]  [address]    Report Status 12/13/2023 FINAL  Final  Culture, blood (Routine X 2) w Reflex to ID Panel     Status: None (Preliminary result)   Collection Time: 12/11/23  6:38 AM   Specimen: BLOOD  Result Value Ref Range Status   Specimen Description BLOOD LEFT ANTECUBITAL  Final   Special Requests   Final    BOTTLES DRAWN AEROBIC AND ANAEROBIC Blood Culture results may not be optimal due to an inadequate volume of blood received in culture bottles   Culture   Final    NO GROWTH 3 DAYS Performed at [facility], [address] [place], [place] [address]    Report Status PENDING  Incomplete  Culture, blood (Routine X 2) w Reflex to ID Panel     Status: None (Preliminary result)   Collection Time: 12/11/23  6:38 AM   Specimen: BLOOD LEFT HAND  Result Value Ref Range Status   Specimen Description BLOOD LEFT HAND  Final   Special Requests   Final    BOTTLES DRAWN AEROBIC AND ANAEROBIC Blood Culture adequate volume   Culture   Final    NO GROWTH 3 DAYS Performed at [facility], [address], [place], [place] [address]    Report Status PENDING  Incomplete    Procedures and diagnostic studies:  ECHOCARDIOGRAM COMPLETE Result Date: 12/12/2023    ECHOCARDIOGRAM REPORT   Patient Name:   Cassandra Weiss Date of Exam: 12/12/2023 Medical Rec #:  [id number]     Height:       62.0 in Accession #:    [id number]    Weight:       131.6 lb Date of Birth:  [birth year]     BSA:          1.600 m Patient Age:    89 years      BP:           161/71 mmHg Patient Gender: F             HR:           56 bpm. Exam Location:  [facility] Procedure: 2D Echo, Cardiac Doppler and Color Doppler (Both Spectral and Color            Flow Doppler were utilized during procedure). Indications:     Bacteremia R78.81  History:         Patient has no prior history of Echocardiogram examinations.  Arrythmias:Atrial Fibrillation; Risk Factors:Hypertension.  Sonographer:     Christopher Furnace Referring Phys:   [id number] DONALD BERLIN Diagnosing Phys: Keller Paterson  Sonographer Comments: Technically challenging study due to limited acoustic windows and no apical window. IMPRESSIONS  1. Technically difficult study with no apical views.  2. Left ventricular ejection fraction, by estimation, is 60 to 65%. The left ventricle has normal function. Left ventricular endocardial border not optimally defined to evaluate regional wall motion. 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 Left ventricular diastolic parameters are indeterminate.  3. Right ventricular systolic function was not well visualized. The right ventricular size is not well visualized.  4. The mitral valve is normal in structure. Trivial mitral valve regurgitation.  5. Echogenicity (0.5 cm X 0.5 cm) noted on ventricular aspect of aortic valve which can suggest vegetation vs degenerative change. Recommend TEE for further evaluation as clinically indicated. The aortic valve is tricuspid. Aortic valve regurgitation is  not visualized. Aortic valve sclerosis/calcification is present, without any evidence of aortic stenosis.  6. The inferior vena cava is normal in size with greater than 50% respiratory variability, suggesting right atrial pressure of 3 mmHg. FINDINGS  Left Ventricle: Left ventricular ejection fraction, by estimation, is 60 to 65%. The left ventricle has normal function. Left ventricular endocardial border not optimally defined to evaluate regional wall motion. The left ventricular internal cavity size was normal in size. 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 Left ventricular diastolic parameters are indeterminate. Right Ventricle: The right ventricular size is not well visualized. Right vetricular wall thickness was not well visualized. Right ventricular systolic function was not well visualized. Left Atrium: Left atrial size was not well visualized. Right Atrium: Right  atrial size was not well visualized. Pericardium: There is no evidence of pericardial effusion. Mitral Valve: The mitral valve is normal in structure. Mild mitral annular calcification. Trivial mitral valve regurgitation. Tricuspid Valve: The tricuspid valve is normal in structure. Tricuspid valve regurgitation is mild. Aortic Valve: Echogenicity (0.5 cm X 0.5 cm) noted on ventricular aspect of aortic valve which can suggest vegetation vs degenerative change. Recommend TEE for further evaluation as clinically indicated. The aortic valve is tricuspid. Aortic valve regurgitation is not visualized. Aortic valve sclerosis/calcification is present, without any evidence of aortic stenosis. Pulmonic Valve: The pulmonic valve was not well visualized. Pulmonic valve regurgitation is trivial. Aorta: The aortic root is normal in size and structure. Venous: The inferior vena cava is normal in size with greater than 50% respiratory variability, suggesting right atrial pressure of 3 mmHg. IAS/Shunts: The interatrial septum was not well visualized.  LEFT VENTRICLE PLAX 2D LVIDd:         3.00 cm LVIDs:         2.00 cm LV PW:         1.30 cm LV IVS:        1.80 cm LVOT diam:     2.00 cm LVOT Area:     3.14 cm  LEFT ATRIUM         Index LA diam:    4.80 cm 3.00 cm/m   AORTA Ao Root diam: 3.30 cm TRICUSPID VALVE TR Peak grad:   28.1 mmHg TR Vmax:        265.00 cm/s  SHUNTS Systemic Diam: 2.00 cm Keller Paterson Electronically signed by Keller Paterson Signature Date/Time: 12/12/2023/12:47:21 PM    Final  LOS: 6 days   Neyah Ellerman  [facility]   Pager on [email or web address]. If 7PM-7AM, please contact night-coverage at [email or web address]     12/14/2023, 11:29 AM

## 2023-12-14 NOTE — Progress Notes (Signed)
 Date of Admission:  12/08/2023      ID: ELVERIA LAUDERBAUGH is a 89 y.o. female Principal Problem:   Sepsis secondary to UTI [facility]) Active Problems:   Altered mental status   AKI (acute kidney injury)   AMS (altered mental status)   Acute urinary tract infection   Benign essential HTN   Coronary artery disease   Hyperlipidemia   Paroxysmal A-fib (HCC)   Iron deficiency anemia   Hypernatremia   MSSA bacteremia   Acute metabolic encephalopathy   Hypercoagulable state   Urge urinary incontinence   Hypokalemia  TEE negative  Subjective: Sleeping after TEE No family at bed side  Medications:   amLODipine   5 mg Oral Daily   vitamin C   500 mg Oral BID   atorvastatin   80 mg Oral Daily   carvedilol   25 mg Oral BID   Chlorhexidine  Gluconate Cloth  6 each Topical Daily   feeding supplement  237 mL Oral TID BM   gabapentin   300 mg Oral TID   hydrALAZINE   50 mg Oral TID   lidocaine   1 patch Transdermal Q24H   multivitamin with minerals  1 tablet Oral Daily   sodium chloride  flush  10-40 mL Intracatheter Q12H   Warfarin - Pharmacist Dosing Inpatient   Does not apply q1600   zinc  sulfate (50mg  elemental zinc )  220 mg Oral Daily    Objective: Vital signs in last 24 hours: Patient Vitals for the past 24 hrs:  BP Temp Temp src Pulse Resp SpO2  12/14/23 1532 124/68 (!) 97.5 F (36.4 C) Oral 76 18 100 %  12/14/23 1400 (!) 126/97 (!) 96.8 F (36 C) Temporal 74 18 96 %  12/14/23 1345 (!) 119/50 -- -- 75 12 99 %  12/14/23 1236 (!) 124/56 98.3 F (36.8 C) Oral 81 20 98 %  12/14/23 0814 128/60 98.2 F (36.8 C) Oral 71 18 96 %  12/14/23 0435 (!) 127/111 98.3 F (36.8 C) Oral 79 16 100 %  12/13/23 2100 136/63 98.2 F (36.8 C) Oral 81 18 100 %  12/13/23 2041 (!) 135/35 98 F (36.7 C) Oral 64 16 95 %     PHYSICAL EXAM:  General: sleeping Lungs: b/l air entry Heart: Regular rate and rhythm, no murmur, rub or gallop. Abdomen: Soft, non-tender,not distended. Bowel sounds normal. No  masses  Skin: No rashes or lesions. Or bruising  Neurologic: not examined  Lab Results    Latest Ref Rng & Units 12/14/2023    6:32 AM 12/13/2023    5:22 AM 12/11/2023    6:38 AM  CBC  WBC 4.0 - 10.5 K/uL 8.3  8.5  8.0   Hemoglobin 12.0 - 15.0 g/dL 9.1  9.9  9.2   Hematocrit 36.0 - 46.0 % 28.8  31.6  28.0   Platelets 150 - 400 K/uL 217  248  249        Latest Ref Rng & Units 12/14/2023    6:32 AM 12/13/2023    5:22 AM 12/11/2023    6:38 AM  CMP  Glucose 70 - 99 mg/dL 99  81  885   BUN 8 - 23 mg/dL 11  12  20    Creatinine 0.44 - 1.00 mg/dL 8.94  8.96  8.82   Sodium 135 - 145 mmol/L 143  142  144   Potassium 3.5 - 5.1 mmol/L 3.3  3.6  3.5   Chloride 98 - 111 mmol/L 107  108  110   CO2  22 - 32 mmol/L 24  24  26    Calcium  8.9 - 10.3 mg/dL 8.6  8.4  8.7       Microbiology: 9/18 1 set  MSSA UC Ecoli Studies/Results: ECHO TEE Result Date: 12/14/2023    TRANSESOPHOGEAL ECHO REPORT   Patient Name:   DAYANE HILLENBURG Date of Exam: 12/14/2023 Medical Rec #:  [id number]     Height:       62.0 in Accession #:    [id number]    Weight:       131.6 lb Date of Birth:  1932/07/23     BSA:          1.600 m Patient Age:    89 years      BP:           123/68 mmHg Patient Gender: F             HR:           88 bpm. Exam Location:  [facility] Procedure: Transesophageal Echo and Color Doppler (Both Spectral and Color Flow            Doppler were utilized during procedure). Indications:     Endocarditis  History:         Patient has prior history of Echocardiogram examinations, most                  recent 12/12/2023. Endocarditis.  Sonographer:     Ashley McNeely-Sloane Referring Phys:  [id number] CARALYN HUDSON Diagnosing Phys: Keller Paterson PROCEDURE: After discussion of the risks and benefits of a TEE, an informed consent was obtained from a family member. The transesophogeal probe was passed without difficulty through the esophogus of the patient. Sedation performed by different physician. The patient was monitored  while under deep sedation. Image quality was excellent. The patient's vital signs; including heart rate, blood pressure, and oxygen saturation; remained stable throughout the procedure. The patient developed no complications during the procedure.  IMPRESSIONS  1. Left ventricular ejection fraction, by estimation, is 55 to 60%. The left ventricle has normal function.  2. Right ventricular systolic function is normal. The right ventricular size is normal.  3. Left atrial size was dilated. No left atrial/left atrial appendage thrombus was detected.  4. The mitral valve is normal in structure. Trivial mitral valve regurgitation.  5. The aortic valve is tricuspid. Aortic valve regurgitation is not visualized. Aortic valve sclerosis/calcification is present, without any evidence of aortic stenosis. Conclusion(s)/Recommendation(s): No evidence of vegetation/infective endocarditis on this transesophageael echocardiogram. FINDINGS  Left Ventricle: Left ventricular ejection fraction, by estimation, is 55 to 60%. The left ventricle has normal function. The left ventricular internal cavity size was normal in size. Right Ventricle: The right ventricular size is normal. No increase in right ventricular wall thickness. Right ventricular systolic function is normal. Left Atrium: Left atrial size was dilated. No left atrial/left atrial appendage thrombus was detected. Right Atrium: Right atrial size was normal in size. Pericardium: There is no evidence of pericardial effusion. Mitral Valve: The mitral valve is normal in structure. Trivial mitral valve regurgitation. Tricuspid Valve: The tricuspid valve is normal in structure. Tricuspid valve regurgitation is mild. Aortic Valve: The aortic valve is tricuspid. Aortic valve regurgitation is not visualized. Aortic valve sclerosis/calcification is present, without any evidence of aortic stenosis. Pulmonic Valve: The pulmonic valve was normal in structure. Pulmonic valve regurgitation is  trivial. Aorta: The aortic root is normal in size and structure. IAS/Shunts: No atrial level shunt detected  by color flow Doppler. Keller Alluri Electronically signed by Keller Paterson Signature Date/Time: 12/14/2023/5:41:34 PM    Final       Assessment/Plan: Acute metabolic encephalopathy due to dehydration, hypernatremia and infection- improved but not back to baseline ? Hypotension- resolved  sepsis /dehydration      MSSA bacteremia 1/4- s low bioburden she has some chronic skin wounds- left heel eschar- dry  And superficial sacral decub  Severe degenerative rt shoulder arthritis Xray shoulder repeated on 9/20 which shows worsening degeneration- discuss with ortho to see whether aspiration is  needed to r/o septic arthritis  Though clinically less likely   On cefazolin  2d echo questions aortic valve vegetation TEEno vegetation We may be able to do a long acting glycopeptide IV    Complicated UTI due to foley foley changed in ED Ecoli being treated with Iv cefazolin     Hypernatremia- secondary to dehydration/poor intake- resolved   AKI - improving   Anemia ( r/o bleed)    On coumadin  high INR Received Vitamin K  improved    Discussed with ID pharmacist Tried to call son -

## 2023-12-14 NOTE — Progress Notes (Signed)
 PT Cancellation Note  Patient Details Name: Cassandra Weiss MRN: [id number] DOB: [birth year]   Cancelled Treatment:    Reason Eval/Treat Not Completed: Fatigue/lethargy limiting ability to participate Patient states  I just don't feel like it despite encouragement, continues to decline PT. Will re-attempt at later time/date.     Shilee Biggs 12/14/2023, 10:36 AM

## 2023-12-14 NOTE — Transfer of Care (Signed)
 Immediate Anesthesia Transfer of Care Note  Patient: Cassandra Weiss  Procedure(s) Performed: ECHOCARDIOGRAM, TRANSESOPHAGEAL  Patient Location: Cardiac step down  Anesthesia Type:General  Level of Consciousness: drowsy and patient cooperative  Airway & Oxygen Therapy: Patient Spontanous Breathing and Patient connected to nasal cannula oxygen  Post-op Assessment: Report given to RN and Post -op Vital signs reviewed and stable  Post vital signs: stable  Last Vitals:  Vitals Value Taken Time  BP    Temp    Pulse    Resp    SpO2      Last Pain:  Vitals:   12/14/23 1236  TempSrc: Oral  PainSc: 0-No pain         Complications: No notable events documented.

## 2023-12-14 NOTE — Progress Notes (Signed)
 [facility] CLINIC CARDIOLOGY PROGRESS NOTE       Patient ID: Cassandra Weiss MRN: [id number] DOB/AGE: 1932-04-30 89 y.o.  Admit date: 12/08/2023 Referring Physician Dr. Amaryllis Dare Primary Physician Sadie Manna, MD  Primary Cardiologist Dr. Bosie (last seen 2019) Reason for Consultation possible endocarditis  HPI: Cassandra Weiss is a 89 y.o. female  with a past medical history of persistent atrial fibrillation, hx CVA, hypertension, IDA, cognitive impairment who presented to the ED on 12/08/2023 for worsening altered mental status and hypotension. Cardiology was consulted for further evaluation.   Interval history: -Patient seen and examined this AM, resting comfortably in hospital bed with no family at bedside. -No complaints this AM. BP and HR stable.  -Plan for TEE later today. INR improved today at 3.2.  Review of systems complete and found to be negative unless listed above    Past Medical History:  Diagnosis Date   Arthritis    Atrial fibrillation (HCC)    Hypertension    Urge urinary incontinence     History reviewed. No pertinent surgical history.  Medications Prior to Admission  Medication Sig Dispense Refill Last Dose/Taking   acetaminophen  (TYLENOL ) 325 MG tablet Take 2 tablets (650 mg total) by mouth every 6 (six) hours as needed for mild pain (pain score 1-3) or fever (or Fever >/= 101). 20 tablet 0 Unknown   amLODipine  (NORVASC ) 5 MG tablet Take 5 mg by mouth daily.   12/08/2023 Morning   atorvastatin  (LIPITOR) 80 MG tablet Take 80 mg by mouth daily.   12/08/2023 Morning   Calcium  Alginate-Silver  (RESTORE SILVER  DRESSING) 2X2 PADS Apply 1 Application topically daily.   12/08/2023 Morning   carvedilol  (COREG ) 25 MG tablet Take 25 mg by mouth 2 (two) times daily.   12/08/2023 Morning   furosemide (LASIX) 40 MG tablet Take 40 mg by mouth daily.   12/08/2023 Morning   gabapentin  (NEURONTIN ) 300 MG capsule Take by mouth.  1 (one) Capsule Capsule three times a day    12/08/2023 Morning   Multiple Vitamin (MULTI-VITAMIN) tablet Take 1 tablet by mouth daily.   12/08/2023 Morning   warfarin (COUMADIN ) 1 MG tablet Take 1 mg by mouth daily.   12/08/2023 Morning   ALPRAZolam  (XANAX ) 0.5 MG tablet Take 0.5 mg by mouth at bedtime as needed for sleep.   Unknown   hydrALAZINE  (APRESOLINE ) 50 MG tablet Take 50 mg by mouth 3 (three) times daily.   Unknown   linezolid (ZYVOX) 600 MG tablet Take 600 mg by mouth 2 (two) times daily. (Patient not taking: Reported on 12/08/2023)   Not Taking   Social History   Socioeconomic History   Marital status: Married    Spouse name: Not on file   Number of children: Not on file   Years of education: Not on file   Highest education level: Not on file  Occupational History   Not on file  Tobacco Use   Smoking status: Never   Smokeless tobacco: Never  Vaping Use   Vaping status: Never Used  Substance and Sexual Activity   Alcohol  use: No   Drug use: Never   Sexual activity: Not on file  Other Topics Concern   Not on file  Social History Narrative   Lives with husband at home   Social Drivers of Health   Financial Resource Strain: Low Risk  (08/17/2023)   Received from [facility]   Overall Financial Resource Strain (CARDIA)    Difficulty of Paying Living  Expenses: Not hard at all  Food Insecurity: No Food Insecurity (12/09/2023)   Hunger Vital Sign    Worried About Running Out of Food in the Last Year: Never true    Ran Out of Food in the Last Year: Never true  Transportation Needs: No Transportation Needs (12/09/2023)   PRAPARE - Administrator, Civil Service (Medical): No    Lack of Transportation (Non-Medical): No  Physical Activity: Insufficiently Active (01/13/2018)   Exercise Vital Sign    Days of Exercise per Week: 2 days    Minutes of Exercise per Session: 10 min  Stress: No Stress Concern Present (01/13/2018)   [employer] of Occupational Health - Occupational Stress  Questionnaire    Feeling of Stress : Not at all  Social Connections: Moderately Integrated (12/09/2023)   Social Connection and Isolation Panel    Frequency of Communication with Friends and Family: Once a week    Frequency of Social Gatherings with Friends and Family: Once a week    Attends Religious Services: More than 4 times per year    Active Member of [employer] or Organizations: Yes    Attends Banker Meetings: More than 4 times per year    Marital Status: Married  Catering manager Violence: Patient Unable To Answer (12/09/2023)   Humiliation, Afraid, Rape, and Kick questionnaire    Fear of Current or Ex-Partner: Patient unable to answer    Emotionally Abused: Patient unable to answer    Physically Abused: Patient unable to answer    Sexually Abused: Patient unable to answer    Family History  Problem Relation Age of Onset   Heart disease Mother    Stroke Mother    Heart disease Father      Vitals:   12/13/23 2041 12/13/23 2100 12/14/23 0435 12/14/23 0814  BP: (!) 135/35 136/63 (!) 127/111 128/60  Pulse: 64 81 79 71  Resp: 16 18 16 18   Temp: 98 F (36.7 C) 98.2 F (36.8 C) 98.3 F (36.8 C) 98.2 F (36.8 C)  TempSrc: Oral Oral Oral Oral  SpO2: 95% 100% 100% 96%  Weight:        PHYSICAL EXAM General: Chronically ill-appearing elderly female, well nourished, in no acute distress. HEENT: Normocephalic and atraumatic. Neck: No JVD.  Lungs: Normal respiratory effort on room air. Clear bilaterally to auscultation. No wheezes, crackles, rhonchi.  Heart: Irregularly irregular, controlled rate. Normal S1 and S2 without gallops or murmurs.  Abdomen: Non-distended appearing.  Msk: Normal strength and tone for age. Extremities: Warm and well perfused. No clubbing, cyanosis.  No edema.  Neuro: Alert and oriented X 1. Psych: Answers questions appropriately.   Labs: Basic Metabolic Panel: Recent Labs    12/13/23 0522 12/14/23 0632  NA 142 143  K 3.6 3.3*  CL  108 107  CO2 24 24  GLUCOSE 81 99  BUN 12 11  CREATININE 1.03* 1.05*  CALCIUM  8.4* 8.6*   Liver Function Tests: No results for input(s): AST, ALT, ALKPHOS, BILITOT, PROT, ALBUMIN in the last 72 hours.  No results for input(s): LIPASE, AMYLASE in the last 72 hours. CBC: Recent Labs    12/13/23 0522 12/14/23 0632  WBC 8.5 8.3  HGB 9.9* 9.1*  HCT 31.6* 28.8*  MCV 88.0 87.8  PLT 248 217   Cardiac Enzymes: No results for input(s): CKTOTAL, CKMB, CKMBINDEX, TROPONINIHS in the last 72 hours. BNP: No results for input(s): BNP in the last 72 hours. D-Dimer: No results for input(s):  DDIMER in the last 72 hours. Hemoglobin A1C: No results for input(s): HGBA1C in the last 72 hours. Fasting Lipid Panel: No results for input(s): CHOL, HDL, LDLCALC, TRIG, CHOLHDL, LDLDIRECT in the last 72 hours. Thyroid  Function Tests: No results for input(s): TSH, T4TOTAL, T3FREE, THYROIDAB in the last 72 hours.  Invalid input(s): FREET3 Anemia Panel: No results for input(s): VITAMINB12, FOLATE, FERRITIN, TIBC, IRON, RETICCTPCT in the last 72 hours.    Radiology: ECHOCARDIOGRAM COMPLETE Result Date: 12/12/2023    ECHOCARDIOGRAM REPORT   Patient Name:   Cassandra Weiss Date of Exam: 12/12/2023 Medical Rec #:  [id number]     Height:       62.0 in Accession #:    [id number]    Weight:       131.6 lb Date of Birth:  1933/01/06     BSA:          1.600 m Patient Age:    89 years      BP:           161/71 mmHg Patient Gender: F             HR:           56 bpm. Exam Location:  [facility] Procedure: 2D Echo, Cardiac Doppler and Color Doppler (Both Spectral and Color            Flow Doppler were utilized during procedure). Indications:     Bacteremia R78.81  History:         Patient has no prior history of Echocardiogram examinations.                  Arrythmias:Atrial Fibrillation; Risk Factors:Hypertension.  Sonographer:     Christopher Furnace Referring Phys:  [id number]  DONALD BERLIN Diagnosing Phys: Keller Paterson  Sonographer Comments: Technically challenging study due to limited acoustic windows and no apical window. IMPRESSIONS  1. Technically difficult study with no apical views.  2. Left ventricular ejection fraction, by estimation, is 60 to 65%. The left ventricle has normal function. Left ventricular endocardial border not optimally defined to evaluate regional wall motion. 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 Left ventricular diastolic parameters are indeterminate.  3. Right ventricular systolic function was not well visualized. The right ventricular size is not well visualized.  4. The mitral valve is normal in structure. Trivial mitral valve regurgitation.  5. Echogenicity (0.5 cm X 0.5 cm) noted on ventricular aspect of aortic valve which can suggest vegetation vs degenerative change. Recommend TEE for further evaluation as clinically indicated. The aortic valve is tricuspid. Aortic valve regurgitation is  not visualized. Aortic valve sclerosis/calcification is present, without any evidence of aortic stenosis.  6. The inferior vena cava is normal in size with greater than 50% respiratory variability, suggesting right atrial pressure of 3 mmHg. FINDINGS  Left Ventricle: Left ventricular ejection fraction, by estimation, is 60 to 65%. The left ventricle has normal function. Left ventricular endocardial border not optimally defined to evaluate regional wall motion. The left ventricular internal cavity size was normal in size. 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 Left ventricular diastolic parameters are indeterminate. Right Ventricle: The right ventricular size is not well visualized. Right vetricular wall thickness was not well visualized. Right ventricular systolic function was not well visualized. Left Atrium: Left atrial size was not well visualized. Right Atrium: Right atrial  size was not well visualized. Pericardium: There is no evidence of pericardial effusion. Mitral  Valve: The mitral valve is normal in structure. Mild mitral annular calcification. Trivial mitral valve regurgitation. Tricuspid Valve: The tricuspid valve is normal in structure. Tricuspid valve regurgitation is mild. Aortic Valve: Echogenicity (0.5 cm X 0.5 cm) noted on ventricular aspect of aortic valve which can suggest vegetation vs degenerative change. Recommend TEE for further evaluation as clinically indicated. The aortic valve is tricuspid. Aortic valve regurgitation is not visualized. Aortic valve sclerosis/calcification is present, without any evidence of aortic stenosis. Pulmonic Valve: The pulmonic valve was not well visualized. Pulmonic valve regurgitation is trivial. Aorta: The aortic root is normal in size and structure. Venous: The inferior vena cava is normal in size with greater than 50% respiratory variability, suggesting right atrial pressure of 3 mmHg. IAS/Shunts: The interatrial septum was not well visualized.  LEFT VENTRICLE PLAX 2D LVIDd:         3.00 cm LVIDs:         2.00 cm LV PW:         1.30 cm LV IVS:        1.80 cm LVOT diam:     2.00 cm LVOT Area:     3.14 cm  LEFT ATRIUM         Index LA diam:    4.80 cm 3.00 cm/m   AORTA Ao Root diam: 3.30 cm TRICUSPID VALVE TR Peak grad:   28.1 mmHg TR Vmax:        265.00 cm/s  SHUNTS Systemic Diam: 2.00 cm Keller Paterson Electronically signed by Keller Paterson Signature Date/Time: 12/12/2023/12:47:21 PM    Final    DG Chest Port 1 View Result Date: 12/11/2023 CLINICAL DATA:  PICC line placement EXAM: PORTABLE CHEST 1 VIEW COMPARISON:  12/11/2023 FINDINGS: Left PICC line in place with the tip in the SVC. Cardiomegaly, vascular congestion. Interstitial prominence may reflect interstitial edema. No effusions. IMPRESSION: Left PICC line tip in the SVC. Cardiomegaly with vascular congestion and suspected early interstitial edema. Electronically Signed    By: Franky Crease M.D.   On: 12/11/2023 17:10   DG Chest Port 1 View Result Date: 12/11/2023 CLINICAL DATA:  PICC line placement. EXAM: PORTABLE CHEST 1 VIEW COMPARISON:  12/08/2023 FINDINGS: The cardio pericardial silhouette is enlarged. There is pulmonary vascular congestion without overt pulmonary edema. No pleural effusion or pneumothorax. Left PICC line tip overlies the region of the innominate vein confluence. Bones are diffusely demineralized with degenerative changes noted in both shoulders. IMPRESSION: Left PICC line tip overlies the region of the innominate vein confluence. Electronically Signed   By: Camellia Candle M.D.   On: 12/11/2023 08:56   DG Shoulder Right Result Date: 12/10/2023 CLINICAL DATA:  Pain EXAM: RIGHT SHOULDER - 2+ VIEW COMPARISON:  Right shoulder x-ray 10/04/2023. FINDINGS: The bones are osteopenic. There is no acute fracture or dislocation identified. There severe degenerative changes of the glenohumeral joint with marked joint space narrowing and exuberant osteophyte formation. Findings have progressed compared to prior study. Moderate degenerative changes of the acromioclavicular joint appear similar to the prior study. Soft tissues are within normal limits. IMPRESSION: 1. Progression of severe degenerative changes of the glenohumeral joint. 2. No acute fracture. Electronically Signed   By: Greig Pique M.D.   On: 12/10/2023 18:24   US  EKG SITE RITE Result Date: 12/10/2023 If Site Rite image not attached, placement could not be confirmed due to current cardiac rhythm.  CT Renal Stone Study Result Date: 12/08/2023 CLINICAL DATA:  Abdominal/flank pain, stone suspected EXAM: CT ABDOMEN AND  PELVIS WITHOUT CONTRAST TECHNIQUE: Multidetector CT imaging of the abdomen and pelvis was performed following the standard protocol without IV contrast. RADIATION DOSE REDUCTION: This exam was performed according to the departmental dose-optimization program which includes automated exposure  control, adjustment of the mA and/or kV according to patient size and/or use of iterative reconstruction technique. COMPARISON:  None Available. FINDINGS: Lower chest: Cardiomegaly. Severe atherosclerotic plaque. Trace to small left and trace right pleural effusions. Small hiatal hernia. Hepatobiliary: No focal liver abnormality. No gallstones, gallbladder wall thickening, or pericholecystic fluid. No biliary dilatation. Pancreas: Diffusely atrophic. No focal lesion. Otherwise normal pancreatic contour. No surrounding inflammatory changes. No main pancreatic ductal dilatation. Spleen: Normal in size without focal abnormality. Adrenals/Urinary Tract: No adrenal nodule bilaterally. Fluid density lesion of the right kidney likely represents simple renal cyst. Bilateral high density lesions within the kidneys likely represent proteinaceous or hemorrhagic cysts. There is 4 mm left nephrolithiasis. The urinary bladder is decompressed with Foley catheter tip and balloon terminating within its lumen. Stomach/Bowel: Stomach is within normal limits. No evidence of bowel wall thickening or dilatation. The appendix is not definitely identified with no inflammatory changes in the right lower quadrant to suggest acute appendicitis. Vascular/Lymphatic: No abdominal aorta or iliac aneurysm. Severe atherosclerotic plaque of the aorta and its branches. No abdominal, pelvic, or inguinal lymphadenopathy. Reproductive: Status post hysterectomy. No adnexal masses. Other: No intraperitoneal free fluid. No intraperitoneal free gas. No organized fluid collection. Musculoskeletal: Tiny fat containing umbilical hernia. No suspicious lytic or blastic osseous lesions. No acute displaced fracture. Marked L5-S1 disc bulge. IMPRESSION: 1. Trace to small left and trace right pleural effusions. 2. Cardiomegaly. 3. Small hiatal hernia. 4. Nonobstructive 4 mm left nephrolithiasis. 5. Marked L5-S1 disc bulge. 6. Otherwise limited evaluation on this  noncontrast study. 7.  Aortic Atherosclerosis (ICD10-I70.0). Electronically Signed   By: Morgane  Naveau M.D.   On: 12/08/2023 17:56   CT HEAD WO CONTRAST ( ) Result Date: 12/08/2023 CLINICAL DATA:  Altered level of consciousness, hypotension EXAM: CT HEAD WITHOUT CONTRAST TECHNIQUE: Contiguous axial images were obtained from the base of the skull through the vertex without intravenous contrast. RADIATION DOSE REDUCTION: This exam was performed according to the departmental dose-optimization program which includes automated exposure control, adjustment of the mA and/or kV according to patient size and/or use of iterative reconstruction technique. COMPARISON:  10/04/2023 FINDINGS: Brain: Stable hypodensities within the periventricular white matter and basal ganglia, compatible with chronic small vessel ischemic changes. No evidence of acute infarct or hemorrhage. Lateral ventricles and midline structures are unremarkable. No acute extra-axial fluid collections. No mass effect. Vascular: No hyperdense vessel or unexpected calcification. Skull: Normal. Negative for fracture or focal lesion. Sinuses/Orbits: Opacification of the left sphenoid sinus. Polypoid mucosal thickening right maxillary sinus. No gas fluid levels. Other: None. IMPRESSION: 1. Stable chronic small vessel ischemic changes. No acute intracranial process. 2. Paranasal sinus disease as above. Electronically Signed   By: Ozell Daring M.D.   On: 12/08/2023 17:47   DG Chest Portable 1 View Result Date: 12/08/2023 CLINICAL DATA:  AMS EXAM: PORTABLE CHEST - 1 VIEW COMPARISON:  10/04/2023 FINDINGS: No focal airspace consolidation, pleural effusion, or pneumothorax. Moderate cardiomegaly. Tortuous aorta with aortic atherosclerosis. No acute fracture or destructive lesions. Multilevel thoracic osteophytosis. Advanced degenerative changes of both shoulders. IMPRESSION: No acute cardiopulmonary abnormality. Electronically Signed   By: Rogelia Myers  M.D.   On: 12/08/2023 15:34    ECHO as above  TELEMETRY reviewed by me 12/14/2023: not on tele  EKG reviewed by me: atrial fibrillation rate 92 bpm  Data reviewed by me 12/14/2023: last 24h vitals tele labs imaging I/O ED provider note, admission H&P, hospitalist progress note, ID notes, palliative notes  Principal Problem:   Sepsis secondary to UTI [facility]) Active Problems:   Altered mental status   AKI (acute kidney injury)   AMS (altered mental status)   Acute urinary tract infection   Benign essential HTN   Coronary artery disease   Hyperlipidemia   Paroxysmal A-fib (HCC)   Iron deficiency anemia   Hypernatremia   MSSA bacteremia   Acute metabolic encephalopathy   Hypercoagulable state   Urge urinary incontinence   Hypokalemia    ASSESSMENT AND PLAN:  SHEALEIGH DUNSTAN is a 89 y.o. female  with a past medical history of persistent atrial fibrillation, hx CVA, hypertension, IDA, cognitive impairment who presented to the ED on 12/08/2023 for worsening altered mental status and hypotension. Cardiology was consulted for further evaluation.   # MSSA bacteremia # Urosepsis, AKI # Abnormal echocardiogram # Persistent atrial fibrillation # Hypertension # Hyperlipidemia Patient was brought to the ED due to concerns for altered mental status and hypotension.  Has a chronic indwelling Foley catheter and was found to have UTI and sepsis.  Blood cultures positive for MSSA.  Echo today with EF 60-65%, severe asymmetric LVH, echogenicity 0.5 cm x 0.5 cm noted on ventricular aspect of aortic valve (vegetation versus degenerative change). -Plan for TEE today for further evaluation of possible endocarditis. NPO at midnight.  Consent will need to be signed by patient's husband due to her baseline cognitive impairment. Procedure was postponed yesterday as patient ate chicken despite being NPO. NPO status discussed with patient's RN this AM. -Warfarin as per pharmacy.  INR supratherapeutic today at  3.2.  Will monitor closely, would not proceed with TEE if INR greater than 3.8. -Further management of UTI, sepsis, bacteremia as per primary team. -Continue amlodipine  10 mg daily, carvedilol  25 mg twice daily, hydralazine  50 mg 3 times daily. -Continue atorvastatin  80 mg daily.   This patient's plan of care was discussed and created with Dr. Wilburn and he is in agreement.  Signed: Danita Bloch, PA-C  12/14/2023, 8:19 AM [facility]

## 2023-12-14 NOTE — Consult Note (Addendum)
 PHARMACY - ANTICOAGULATION CONSULT NOTE  Pharmacy Consult for warfarin  Indication: Afib   Allergies  Allergen Reactions   Penicillins Rash    Has patient had a PCN reaction causing immediate rash, facial/tongue/throat swelling, SOB or lightheadedness with hypotension: Unknown Has patient had a PCN reaction causing severe rash involving mucus membranes or skin necrosis: Unknown Has patient had a PCN reaction that required hospitalization: Unknown Has patient had a PCN reaction occurring within the last 10 years: Unknown If all of the above answers are NO, then may proceed with Cephalosporin use.     Patient Measurements: Weight: 59.7 kg (131 lb 9.8 oz)  Vital Signs: Temp: 98.3 F (36.8 C) (09/24 0435) Temp Source: Oral (09/24 0435) BP: 127/111 (09/24 0435) Pulse Rate: 79 (09/24 0435)  Labs: Recent Labs    12/12/23 0504 12/13/23 0522 12/14/23 0632  HGB  --  9.9* 9.1*  HCT  --  31.6* 28.8*  PLT  --  248 217  LABPROT 35.4* 36.7* 33.8*  INR 3.3* 3.5* 3.2*  CREATININE  --  1.03* 1.05*    Estimated Creatinine Clearance: 28.7 mL/min (A) (by C-G formula based on SCr of 1.05 mg/dL (H)).   Medical History: Past Medical History:  Diagnosis Date   Arthritis    Atrial fibrillation (HCC)    Hypertension    Urge urinary incontinence     Medications:  Reported home regimen is Warfarin 1 mg po daily with last dose 12/08/23  Assessment: 89 yo female with a PMH of A.fib, HTN, and CAD presented to ED due to altered mental status. In ED patient found to have supratherapeutic INR.  Pharmacy consulted to manage warfarin dosing.  DDI: Ancef  (increase INR)  Goal of Therapy:  INR 2-3 Monitor platelets by anticoagulation protocol: Yes   Date INR Warfarin Dose  9/18 8.8 1 mg (PTA)  9/19 10.1 Hold. IV vitamin K  given.   9/20 1.7 1 mg  9/21 2.8 Hold  9/22 3.3 Hold  9/23 3.5 Hold  9/24 3.2 Hold      Plan:  INR is supratherapeutic with a 0.3 decrease from yesterday after  holding home dose x3 days INR trend potentially due to poor diet, (albumin 2.2 on 9/21), and acute illness. Patient placed on NPO status on 9/23 for planned TEE pushed to 9/24. Will continue to HOLD warfarin dose today Daily INR while inpatient and CBC at least every 72 hours  Annabella LOISE Banks, PharmD Clinical Pharmacist 12/14/2023 7:39 AM

## 2023-12-14 NOTE — Anesthesia Preprocedure Evaluation (Signed)
 Anesthesia Evaluation  Patient identified by MRN, date of birth, ID band Patient confused    Reviewed: Allergy & Precautions, H&P , NPO status , Patient's Chart, lab work & pertinent test results, reviewed documented beta blocker date and time   Airway Mallampati: II   Neck ROM: full    Dental  (+) Poor Dentition   Pulmonary neg pulmonary ROS   Pulmonary exam normal        Cardiovascular Exercise Tolerance: Poor hypertension, On Medications + CAD  Normal cardiovascular exam Rhythm:regular Rate:Normal     Neuro/Psych negative neurological ROS  negative psych ROS   GI/Hepatic negative GI ROS, Neg liver ROS,,,  Endo/Other  negative endocrine ROS    Renal/GU Renal disease  negative genitourinary   Musculoskeletal   Abdominal   Peds  Hematology  (+) Blood dyscrasia, anemia   Anesthesia Other Findings Past Medical History: No date: Arthritis No date: Atrial fibrillation (HCC) No date: Hypertension No date: Urge urinary incontinence History reviewed. No pertinent surgical history. BMI    Body Mass Index: 24.07 kg/m     Reproductive/Obstetrics negative OB ROS                              Anesthesia Physical Anesthesia Plan  ASA: 4  Anesthesia Plan: General   Post-op Pain Management:    Induction:   PONV Risk Score and Plan:   Airway Management Planned:   Additional Equipment:   Intra-op Plan:   Post-operative Plan:   Informed Consent: I have reviewed the patients History and Physical, chart, labs and discussed the procedure including the risks, benefits and alternatives for the proposed anesthesia with the patient or authorized representative who has indicated his/her understanding and acceptance.       Plan Discussed with: CRNA  Anesthesia Plan Comments: (Pt unable to give consent.  I reached out to the husband with multiple calls but he has no voice mail and there  was no answer.  Dr. Wilburn spoke with [name] yesterday and the need for anesthesia for the procedure was communicated and voiced to be acceptable.  I feel that it is acceptable to proceed with GA for the above. JA)        Anesthesia Quick Evaluation

## 2023-12-14 NOTE — Plan of Care (Signed)
                                                     Palliative Care Progress Note   Patient Name: Cassandra Weiss       Date: 12/14/2023 DOB: 08/03/1932  Age: 89 y.o. MRN#: [id number] Attending Physician: Jens Durand, MD Primary Care Physician: Sadie Manna, MD Admit Date: 12/08/2023  Extensive chart review completed including labs, vital signs, imaging, progress notes, orders, and available advanced directive documents from current and previous encounters.   Plan for TEE today.  Family shared in previous visit that they are awaiting results of this test in order to the look to discuss next steps.  PMT will continue to follow and support.  No acute palliative needs today.  Thank you for allowing the Palliative Medicine Team to assist in the care of [name].  Lamarr L. Arvid, DNP, FNP-BC Palliative Medicine Team  No charge

## 2023-12-14 NOTE — Progress Notes (Signed)
 Date of Admission:  12/08/2023      ID: Cassandra Weiss is a 89 y.o. female Principal Problem:   Sepsis secondary to UTI [facility]) Active Problems:   Altered mental status   AKI (acute kidney injury)   AMS (altered mental status)   Acute urinary tract infection   Benign essential HTN   Coronary artery disease   Hyperlipidemia   Paroxysmal A-fib (HCC)   Iron deficiency anemia   Hypernatremia   MSSA bacteremia   Acute metabolic encephalopathy   Hypercoagulable state   Urge urinary incontinence   Hypokalemia  TEE negative  Subjective: Doing better Husband and daughter in law at bed side  Medications:   amLODipine   5 mg Oral Daily   vitamin C   500 mg Oral BID   atorvastatin   80 mg Oral Daily   carvedilol   25 mg Oral BID   Chlorhexidine  Gluconate Cloth  6 each Topical Daily   feeding supplement  237 mL Oral TID BM   gabapentin   300 mg Oral TID   hydrALAZINE   50 mg Oral TID   lidocaine   1 patch Transdermal Q24H   multivitamin with minerals  1 tablet Oral Daily   sodium chloride  flush  10-40 mL Intracatheter Q12H   Warfarin - Pharmacist Dosing Inpatient   Does not apply q1600   zinc  sulfate (50mg  elemental zinc )  220 mg Oral Daily    Objective: Vital signs in last 24 hours: Patient Vitals for the past 24 hrs:  BP Temp Temp src Pulse Resp SpO2  12/14/23 1400 (!) 126/97 (!) 96.8 F (36 C) Temporal 74 18 96 %  12/14/23 1345 (!) 119/50 -- -- 75 12 99 %  12/14/23 1236 (!) 124/56 98.3 F (36.8 C) Oral 81 20 98 %  12/14/23 0814 128/60 98.2 F (36.8 C) Oral 71 18 96 %  12/14/23 0435 (!) 127/111 98.3 F (36.8 C) Oral 79 16 100 %  12/13/23 2100 136/63 98.2 F (36.8 C) Oral 81 18 100 %  12/13/23 2041 (!) 135/35 98 F (36.7 C) Oral 64 16 95 %  12/13/23 1557 (!) 113/56 97.9 F (36.6 C) Oral 65 17 97 %     PHYSICAL EXAM:  General: Alert, cooperative, no distress, some confusion Head: Normocephalic, without obvious abnormality, atraumatic. Eyes: Conjunctivae clear,  anicteric sclerae. Pupils are equal ENT Nares normal. No drainage or sinus tenderness. Lips, mucosa, and tongue normal. No Thrush Neck: Supple, symmetrical, no adenopathy, thyroid : non tender no carotid bruit and no JVD. Back: No CVA tenderness. Lungs: Clear to auscultation bilaterally. No Wheezing or Rhonchi. No rales. Heart: Regular rate and rhythm, no murmur, rub or gallop. Abdomen: Soft, non-tender,not distended. Bowel sounds normal. No masses Extremities: atraumatic, no cyanosis. No edema. No clubbing Skin: No rashes or lesions. Or bruising Lymph: Cervical, supraclavicular normal. Neurologic: Grossly non-focal  Lab Results    Latest Ref Rng & Units 12/14/2023    6:32 AM 12/13/2023    5:22 AM 12/11/2023    6:38 AM  CBC  WBC 4.0 - 10.5 K/uL 8.3  8.5  8.0   Hemoglobin 12.0 - 15.0 g/dL 9.1  9.9  9.2   Hematocrit 36.0 - 46.0 % 28.8  31.6  28.0   Platelets 150 - 400 K/uL 217  248  249        Latest Ref Rng & Units 12/14/2023    6:32 AM 12/13/2023    5:22 AM 12/11/2023    6:38 AM  CMP  Glucose 70 -  99 mg/dL 99  81  885   BUN 8 - 23 mg/dL 11  12  20    Creatinine 0.44 - 1.00 mg/dL 8.94  8.96  8.82   Sodium 135 - 145 mmol/L 143  142  144   Potassium 3.5 - 5.1 mmol/L 3.3  3.6  3.5   Chloride 98 - 111 mmol/L 107  108  110   CO2 22 - 32 mmol/L 24  24  26    Calcium  8.9 - 10.3 mg/dL 8.6  8.4  8.7       Microbiology: 9/18 1 set  MSSA UC Ecoli Studies/Results: No results found.    Assessment/Plan: Acute metabolic encephalopathy due to dehydration, hypernatremia and infection- improved but not back to baseline ? Hypotension- resolved  sepsis /dehydration   Sepsis due to MSSA bacteremia and possible  UTI due to foley   MSSA bacteremia 1/4- she has some chronic skin wounds- left heel eschar- dry  And superficial sacral decub These could be source Observe for rt shoulder septic arthritis On cefazolin  2d echo questions aortic valve vegetation TEEno vegetation We may be able  to do a long acting glycopeptide tomorrow?   Complicated UTI due to foley Has foley changed in ED B/l renal proteinaceous cysts VS hemorrhage Will add levaquin  till urine culture results   Hypernatremia- secondary to dehydration/poor intake- resolved   AKI - improving   Anemia ( r/o bleed)    On coumadin  high INR Received Vitamin K  improved    Discussed with family and hospitalist

## 2023-12-15 ENCOUNTER — Encounter: Payer: Self-pay | Admitting: Cardiology

## 2023-12-15 ENCOUNTER — Inpatient Hospital Stay

## 2023-12-15 DIAGNOSIS — N179 Acute kidney failure, unspecified: Secondary | ICD-10-CM | POA: Diagnosis not present

## 2023-12-15 DIAGNOSIS — R4182 Altered mental status, unspecified: Secondary | ICD-10-CM | POA: Diagnosis not present

## 2023-12-15 DIAGNOSIS — N390 Urinary tract infection, site not specified: Secondary | ICD-10-CM | POA: Diagnosis not present

## 2023-12-15 DIAGNOSIS — Z515 Encounter for palliative care: Secondary | ICD-10-CM | POA: Diagnosis not present

## 2023-12-15 DIAGNOSIS — A419 Sepsis, unspecified organism: Secondary | ICD-10-CM | POA: Diagnosis not present

## 2023-12-15 DIAGNOSIS — E870 Hyperosmolality and hypernatremia: Secondary | ICD-10-CM | POA: Diagnosis not present

## 2023-12-15 LAB — PROTIME-INR
INR: 3.5 — ABNORMAL HIGH (ref 0.8–1.2)
Prothrombin Time: 37 s — ABNORMAL HIGH (ref 11.4–15.2)

## 2023-12-15 LAB — BASIC METABOLIC PANEL WITH GFR
Anion gap: 11 (ref 5–15)
BUN: 13 mg/dL (ref 8–23)
CO2: 24 mmol/L (ref 22–32)
Calcium: 8.2 mg/dL — ABNORMAL LOW (ref 8.9–10.3)
Chloride: 106 mmol/L (ref 98–111)
Creatinine, Ser: 1.14 mg/dL — ABNORMAL HIGH (ref 0.44–1.00)
GFR, Estimated: 46 mL/min — ABNORMAL LOW (ref >60)
Glucose, Bld: 72 mg/dL (ref 70–99)
Potassium: 3.4 mmol/L — ABNORMAL LOW (ref 3.5–5.1)
Sodium: 141 mmol/L (ref 135–145)

## 2023-12-15 MED ORDER — POTASSIUM CHLORIDE 20 MEQ PO PACK
40.0000 meq | PACK | Freq: Once | ORAL | Status: AC
  Administered 2023-12-15: 40 meq via ORAL
  Filled 2023-12-15: qty 2

## 2023-12-15 MED ORDER — CARMEX CLASSIC LIP BALM EX OINT: TOPICAL_OINTMENT | CUTANEOUS | Status: DC | PRN

## 2023-12-15 MED ORDER — CEFAZOLIN SODIUM-DEXTROSE 2-4 GM/100ML-% IV SOLN
2.0000 g | Freq: Two times a day (BID) | INTRAVENOUS | Status: AC
Start: 2023-12-15 — End: 2023-12-19
  Administered 2023-12-15 – 2023-12-19 (×9): 2 g via INTRAVENOUS
  Filled 2023-12-15 (×11): qty 100

## 2023-12-15 NOTE — Consult Note (Addendum)
 PHARMACY - ANTICOAGULATION CONSULT NOTE  Pharmacy Consult for warfarin  Indication: Afib   Allergies  Allergen Reactions   Penicillins Rash    Has patient had a PCN reaction causing immediate rash, facial/tongue/throat swelling, SOB or lightheadedness with hypotension: Unknown Has patient had a PCN reaction causing severe rash involving mucus membranes or skin necrosis: Unknown Has patient had a PCN reaction that required hospitalization: Unknown Has patient had a PCN reaction occurring within the last 10 years: Unknown If all of the above answers are NO, then may proceed with Cephalosporin use.     Patient Measurements: Weight: 59.7 kg (131 lb 9.8 oz)  Vital Signs: Temp: 98.2 F (36.8 C) (09/25 0354) Temp Source: Oral (09/25 0354) BP: 103/46 (09/25 0354) Pulse Rate: 81 (09/25 0354)  Labs: Recent Labs    12/13/23 0522 12/14/23 9367 12/15/23 0518  HGB 9.9* 9.1*  --   HCT 31.6* 28.8*  --   PLT 248 217  --   LABPROT 36.7* 33.8* 37.0*  INR 3.5* 3.2* 3.5*  CREATININE 1.03* 1.05* 1.14*    Estimated Creatinine Clearance: 26.5 mL/min (A) (by C-G formula based on SCr of 1.14 mg/dL (H)).   Medical History: Past Medical History:  Diagnosis Date   Arthritis    Atrial fibrillation (HCC)    Hypertension    Urge urinary incontinence     Medications:  Reported home regimen is Warfarin 1 mg po daily with last dose 12/08/23  Assessment: 89 yo female with a PMH of A.fib, HTN, and CAD presented to ED due to altered mental status. In ED patient found to have supratherapeutic INR.  Pharmacy consulted to manage warfarin dosing.  DDI: Ancef  (increase INR)  Goal of Therapy:  INR 2-3 Monitor platelets by anticoagulation protocol: Yes   Date INR Warfarin Dose  9/18 8.8 1 mg (PTA)  9/19 10.1 Hold. IV vitamin K  given.   9/20 1.7 1 mg  9/21 2.8 Hold  9/22 3.3 Hold  9/23 3.5 Hold  9/24 3.2 Hold  9/25 3.5 Hold     Plan:  INR is supratherapeutic with a 0.3 increase from  yesterday after holding home dose x4 days INR trend potentially due to poor diet, (albumin 2.2 on 9/21), and acute illness. Patient placed on NPO status on 9/23-9/24 for TEE. Will continue to HOLD warfarin dose today Daily INR while inpatient and CBC at least every 72 hours  Annabella LOISE Banks, PharmD Clinical Pharmacist 12/15/2023 7:39 AM

## 2023-12-15 NOTE — Progress Notes (Signed)
 Nutrition Follow-up  DOCUMENTATION CODES:   Not applicable  INTERVENTION:   -Continue Ensure Plus High Protein po TID, each supplement provides 350 kcal and 20 grams of protein  -Continue MVI with minerals daily -Liberalize diet to regular for widest variety of meal selections -Continue 500 mg vitamin C  BID -Continue 220 mg zinc  sulfate daily x 14 days -RD will draw labs to assess for potential micronutrient deficiencies which may impede wound healing: vitamin A  -Magic cup TID with meals, each supplement provides 290 kcal and 9 grams of protein   NUTRITION DIAGNOSIS:   Increased nutrient needs related to wound healing as evidenced by estimated needs.  Ongoing  GOAL:   Patient will meet greater than or equal to 90% of their needs  Progressing   MONITOR:   PO intake, Supplement acceptance  REASON FOR ASSESSMENT:   Consult Assessment of nutrition requirement/status  ASSESSMENT:   Pt with medical history significant of atrial fibrillation, hypertension, urge incontinence, hyperlipidemia coronary artery disease, iron deficiency anemia who was brought in with altered mental status and hypotension.  Patient apparently has been less responsive for about 2 days PTA.   Patient has chronic indwelling catheter.  9/21- s/p BSE- NPO  9/23- TEE cancelled due to pt eating 9/24- s/p TEE  Reviewed I/O's: -150 ml x 24 hours and +4 L since admission  UOP: 250 ml x 24 hours   Per RN, pt with stage 2 wound on 12/13/23; unstageable wound incorrectly documented.   Pt sleeping soundly at time of visit. Pt did not arouse to voice or touch. No family present at time of visit.   No new wt since last visit.   Palliative care following for goals of care; plan to await TEE result to help guide next steps.   Medications reviewed and include vitamin C , neurontin , MVI,and zinc  sulfate.   Labs reviewed: K: 3.4.    Diet Order:   Diet Order             Diet regular Fluid consistency: Thin   Diet effective now                   EDUCATION NEEDS:   Education needs have been addressed  Skin:  Skin Assessment: Skin Integrity Issues: Skin Integrity Issues:: Unstageable, Stage II Stage II: sacrum Unstageable: lt heel, unstageable wound inccorectly documented per RN on 9/23  Last BM:  12/15/23 (type 6)  Height:   Ht Readings from Last 1 Encounters:  10/28/23 5' 2 (1.575 m)    Weight:   Wt Readings from Last 1 Encounters:  12/11/23 59.7 kg    Ideal Body Weight:  50 kg  BMI:  Body mass index is 24.07 kg/m.  Estimated Nutritional Needs:   Kcal:  1550-1750  Protein:  85-100 grams  Fluid:  1.5-1.7 L    Margery ORN, RD, LDN, CDCES Registered Dietitian III Certified Diabetes Care and Education Specialist If unable to reach this RD, please use RD Inpatient group chat on secure chat between hours of 8am-4 pm daily

## 2023-12-15 NOTE — Progress Notes (Signed)
 PHARMACY NOTE:  ANTIMICROBIAL RENAL DOSAGE ADJUSTMENT  Current antimicrobial regimen includes a mismatch between antimicrobial dosage and estimated renal function.  As per policy approved by the Pharmacy & Therapeutics and Medical Executive Committees, the antimicrobial dosage will be adjusted accordingly.  Current antimicrobial dosage:  Cefazolin  2gm IV q8h  Indication: MSSA bacteremia   Renal Function:  Estimated Creatinine Clearance: 26.5 mL/min (A) (by C-G formula based on SCr of 1.14 mg/dL (H)). []      On intermittent HD, scheduled: []      On CRRT    Antimicrobial dosage has been changed to:  Cefazolin  2gm IV q12h  Additional comments:   Thank you for allowing pharmacy to be a part of this patient's care.  Judas Mohammad, PharmD, BCPS, BCIDP Work Cell: [phone] 12/15/2023 8:46 AM

## 2023-12-15 NOTE — Plan of Care (Signed)

## 2023-12-15 NOTE — Consult Note (Signed)
 ORTHOPAEDIC CONSULTATION  REQUESTING PHYSICIAN: Cassandra Durand, MD  Chief Complaint:   Right shoulder pain.  History of Present Illness: Cassandra Weiss is an 89 y.o. 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  The patient was admitted 1 week ago for altered mental status and hypotension.  Subsequent workup demonstrated the presence of a UTI secondary to E. coli, as well as positive blood cultures secondary to MRSA.  Subsequently, the patient has been admitted for IV antibiotics and further workup/management.  Because of the positive blood cultures, orthopedic consultation has been requested to evaluate her right shoulder and to rule out a septic joint.  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  In addition, at a previous admission 2 months ago, an MRI scan demonstrated the presence of a large shoulder effusion with synovitis.  The patient has been afebrile over the past few days.  In addition, her white blood cell count has remained reasonably stable since admission, ranging from 7.8-8.5 K, and the differential on admission demonstrated 76% neutrophils, 15% lymphocytes, and 7% monocytes.  Past Medical History:  Diagnosis Date   Arthritis    Atrial fibrillation (HCC)    Hypertension    Urge urinary incontinence    History reviewed. No pertinent surgical history. Social History   Socioeconomic History   Marital status: Married    Spouse name: Not on file   Number of children: Not on file   Years of education: Not on file   Highest education level: Not on file  Occupational History   Not on file  Tobacco Use   Smoking status: Never   Smokeless tobacco: Never  Vaping Use   Vaping status: Never Used  Substance and Sexual Activity    Alcohol  use: No   Drug use: Never   Sexual activity: Not on file  Other Topics Concern   Not on file  Social History Narrative   Lives with husband at home   Social Drivers of Health   Financial Resource Strain: Low Risk  (08/17/2023)   Received from [facility]   Overall Financial Resource Strain (CARDIA)    Difficulty of Paying Living Expenses: Not hard at all  Food Insecurity: No Food Insecurity (12/09/2023)   Hunger Vital Sign    Worried About Running Out of Food in the Last Year: Never true    Ran Out of Food in the Last Year: Never true  Transportation Needs: No Transportation Needs (12/09/2023)   PRAPARE - Administrator, Civil Service (Medical): No    Lack of Transportation (Non-Medical): No  Physical Activity: Insufficiently Active (01/13/2018)   Exercise Vital Sign    Days of Exercise per Week: 2 days    Minutes of Exercise per Session: 10 min  Stress: No Stress Concern Present (01/13/2018)   [employer] of Occupational Health - Occupational Stress Questionnaire    Feeling of Stress : Not at all  Social Connections: Moderately Integrated (12/09/2023)   Social Connection and Isolation Panel    Frequency of Communication with Friends and Family: Once a week    Frequency of Social Gatherings with Friends and Family: Once a week    Attends Religious Services: More than 4 times per year    Active Member of [employer] or Organizations: Yes    Attends Banker Meetings: More than 4 times per year  Marital Status: Married   Family History  Problem Relation Age of Onset   Heart disease Mother    Stroke Mother    Heart disease Father    Allergies  Allergen Reactions   Penicillins Rash    Has patient had a PCN reaction causing immediate rash, facial/tongue/throat swelling, SOB or lightheadedness with hypotension: Unknown Has patient had a PCN reaction causing severe rash involving mucus membranes or skin necrosis: Unknown Has  patient had a PCN reaction that required hospitalization: Unknown Has patient had a PCN reaction occurring within the last 10 years: Unknown If all of the above answers are NO, then may proceed with Cephalosporin use.    Prior to Admission medications   Medication Sig Start Date End Date Taking? Authorizing Provider  acetaminophen  (TYLENOL ) 325 MG tablet Take 2 tablets (650 mg total) by mouth every 6 (six) hours as needed for mild pain (pain score 1-3) or fever (or Fever >/= 101). 10/05/23  Yes Djan, Drue DASEN, MD  amLODipine  (NORVASC ) 5 MG tablet Take 5 mg by mouth daily. 11/07/23  Yes [provider]  atorvastatin  (LIPITOR) 80 MG tablet Take 80 mg by mouth daily. 12/14/17  Yes [provider]  Calcium  Alginate-Silver  (RESTORE SILVER  DRESSING) 2X2 PADS Apply 1 Application topically daily. 12/06/23  Yes [provider]  carvedilol  (COREG ) 25 MG tablet Take 25 mg by mouth 2 (two) times daily. 01/05/18  Yes [provider]  furosemide (LASIX) 40 MG tablet Take 40 mg by mouth daily. 11/04/23  Yes [provider]  gabapentin  (NEURONTIN ) 300 MG capsule Take by mouth.  1 (one) Capsule Capsule three times a day 01/14/14  Yes [provider]  Multiple Vitamin (MULTI-VITAMIN) tablet Take 1 tablet by mouth daily.   Yes [provider]  warfarin (COUMADIN ) 1 MG tablet Take 1 mg by mouth daily. 11/17/23  Yes [provider]  ALPRAZolam  (XANAX ) 0.5 MG tablet Take 0.5 mg by mouth at bedtime as needed for sleep. 12/26/17   [provider]  hydrALAZINE  (APRESOLINE ) 50 MG tablet Take 50 mg by mouth 3 (three) times daily.    [provider]  linezolid (ZYVOX) 600 MG tablet Take 600 mg by mouth 2 (two) times daily. Patient not taking: Reported on 12/08/2023 11/07/23   [provider]   ECHO TEE Result Date: 12/14/2023    TRANSESOPHOGEAL ECHO REPORT   Patient Name:   Cassandra Weiss Date of Exam: 12/14/2023 Medical Rec #:   [id number]     Height:       62.0 in Accession #:    [id number]    Weight:       131.6 lb Date of Birth:  February 03, 1933     BSA:          1.600 m Patient Age:    89 years      BP:           123/68 mmHg Patient Gender: F             HR:           88 bpm. Exam Location:  [facility] Procedure: Transesophageal Echo and Color Doppler (Both Spectral and Color Flow            Doppler were utilized during procedure). Indications:     Endocarditis  History:         Patient has prior history of Echocardiogram examinations, most  recent 12/12/2023. Endocarditis.  Sonographer:     Ashley McNeely-Sloane Referring Phys:  [id number] CARALYN HUDSON Diagnosing Phys: Keller Paterson PROCEDURE: After discussion of the risks and benefits of a TEE, an informed consent was obtained from a family member. The transesophogeal probe was passed without difficulty through the esophogus of the patient. Sedation performed by different physician. The patient was monitored while under deep sedation. Image quality was excellent. The patient's vital signs; including heart rate, blood pressure, and oxygen saturation; remained stable throughout the procedure. The patient developed no complications during the procedure.  IMPRESSIONS  1. Left ventricular ejection fraction, by estimation, is 55 to 60%. The left ventricle has normal function.  2. Right ventricular systolic function is normal. The right ventricular size is normal.  3. Left atrial size was dilated. No left atrial/left atrial appendage thrombus was detected.  4. The mitral valve is normal in structure. Trivial mitral valve regurgitation.  5. The aortic valve is tricuspid. Aortic valve regurgitation is not visualized. Aortic valve sclerosis/calcification is present, without any evidence of aortic stenosis. Conclusion(s)/Recommendation(s): No evidence of vegetation/infective endocarditis on this transesophageael echocardiogram. FINDINGS  Left Ventricle: Left ventricular ejection fraction, by  estimation, is 55 to 60%. The left ventricle has normal function. The left ventricular internal cavity size was normal in size. Right Ventricle: The right ventricular size is normal. No increase in right ventricular wall thickness. Right ventricular systolic function is normal. Left Atrium: Left atrial size was dilated. No left atrial/left atrial appendage thrombus was detected. Right Atrium: Right atrial size was normal in size. Pericardium: There is no evidence of pericardial effusion. Mitral Valve: The mitral valve is normal in structure. Trivial mitral valve regurgitation. Tricuspid Valve: The tricuspid valve is normal in structure. Tricuspid valve regurgitation is mild. Aortic Valve: The aortic valve is tricuspid. Aortic valve regurgitation is not visualized. Aortic valve sclerosis/calcification is present, without any evidence of aortic stenosis. Pulmonic Valve: The pulmonic valve was normal in structure. Pulmonic valve regurgitation is trivial. Aorta: The aortic root is normal in size and structure. IAS/Shunts: No atrial level shunt detected by color flow Doppler. Keller Alluri Electronically signed by Keller Paterson Signature Date/Time: 12/14/2023/5:41:34 PM    Final     Positive ROS: All other systems have been reviewed and were otherwise negative with the exception of those mentioned in the HPI and as above.  Physical Exam: General:  Alert, no acute distress Psychiatric:  Patient is appropriately responsive and exhibits normal mood and affect   Cardiovascular:  No pedal edema Respiratory:  No wheezing, non-labored breathing GI:  Abdomen is soft and non-tender Skin:  No lesions in the area of chief complaint Neurologic:  Sensation intact distally Lymphatic:  No axillary or cervical lymphadenopathy  Orthopedic Exam:  Orthopedic examination is limited to the right shoulder and upper extremity.  Skin inspection of the right shoulder is unremarkable.  No swelling, erythema, ecchymosis,  abrasions, or other skin abnormalities are identified.  There does not appear to be a shoulder effusion.  She has no tenderness to palpation over the anterior lateral aspects of the shoulder.  Her shoulder appears to be quite stiff with attempted passive range of motion.  Passively, she can tolerate forward flexion to 20 degrees and extension to 5 degrees.  She is able to tolerate external rotation to 0 degrees and internal rotation to 65 degrees (hand resting on abdomen).  She has no active range of motion due to her prior stroke resulting in a right hemiparesis.  She has  good capillary refill to her hand.  X-rays:  Recent x-rays of the right shoulder are available for review and have been reviewed by myself.  These films demonstrate severe degenerative joint disease of the glenohumeral joint as manifest by complete loss of the glenohumeral joint space and a large inferior humeral osteophyte.  No lytic lesions or fractures are identified.  Both the films and report were reviewed by myself and discussed with the patient.  Assessment: Advanced degenerative joint disease of right shoulder with right upper extremity hemiparesis secondary to prior stroke.  Plan: The treatment options have been discussed with the patient.  At this point, I do not see any evidence for infection of the glenohumeral joint based on her physical examination.  However, for completeness sake, I have ordered a CT scan of the shoulder to look for any evidence of an effusion.  If there is no effusion, then the likelihood of an infection of the joint is further unlikely.  Thank you for asking me to participate in the care of this most pleasant unfortunate woman.  I will be happy to follow her with you.   DOROTHA Reyes Maltos, MD  Beeper #:  [phone]  12/15/2023 10:31 AM

## 2023-12-15 NOTE — Progress Notes (Signed)
 Physical Therapy Treatment Patient Details Name: Cassandra Weiss MRN: [id number] DOB: [birth year] Today's Date: 12/15/2023   History of Present Illness Pt is an 89 y.o. female who was brought to the ER with altered mental status and hypotension. She had been less responsive for about 2 days. Pt admitted for management of sepsis secondary to UTI. PMH of atrial fibrillation, hypertension, urge incontinence,  chronic indwelling catheter, hyperlipidemia coronary artery disease, iron deficiency anemia    PT Comments  Patient received in bed with vomit on chest. RN notified. She appears to have vomited up medications. She is slow to respond and lethargic appearing. She agrees to PT session. Patient requires total assist with supine><sit and repositioning in bed. She demonstrates poor sitting balance requiring assist to prevent posterior leaning/right leaning. Patient is much weaker than she was a few days ago. She will continue to benefit from skilled PT to improve strength and mobility as able.      If plan is discharge home, recommend the following: Two people to help with walking and/or transfers;Two people to help with bathing/dressing/bathroom   Can travel by private vehicle     No  Equipment Recommendations  None recommended by PT    Recommendations for Other Services       Precautions / Restrictions Precautions Precautions: Fall Recall of Precautions/Restrictions: Impaired Restrictions Weight Bearing Restrictions Per Provider Order: No     Mobility  Bed Mobility Overal bed mobility: Needs Assistance Bed Mobility: Supine to Sit, Sit to Supine     Supine to sit: Total assist, HOB elevated Sit to supine: Total assist   General bed mobility comments: Requires hand over hand to place on bed rail to assist with balance. Total assist needed to get to edge of bed. Poor sitting balance once there, requiring assistance to maintain.    Transfers                   General  transfer comment: unable/unsafe to attempt    Ambulation/Gait               General Gait Details: unable   Stairs             Wheelchair Mobility     Tilt Bed    Modified Rankin (Stroke Patients Only)       Balance Overall balance assessment: Needs assistance Sitting-balance support: Feet supported, Single extremity supported Sitting balance-Leahy Scale: Poor Sitting balance - Comments: Unable to sit edge of bed without assistance Postural control: Posterior lean, Right lateral lean                                  Communication Communication Communication: Impaired Factors Affecting Communication: Difficulty expressing self;Reduced clarity of speech  Cognition Arousal: (P) Alert Behavior During Therapy: (P) Flat affect   PT - Cognitive impairments: (P) History of cognitive impairments                         Following commands: Impaired Following commands impaired: Follows one step commands inconsistently, Follows one step commands with increased time    Cueing Cueing Techniques: Verbal cues, Gestural cues  Exercises      General Comments        Pertinent Vitals/Pain Pain Assessment Pain Assessment: PAINAD Breathing: normal Negative Vocalization: none Facial Expression: smiling or inexpressive Body Language: relaxed Consolability: no need to console PAINAD Score: 0 Pain  Location: Seems to have pain in B shoulders Pain Descriptors / Indicators: Guarding Pain Intervention(s): Monitored during session    Home Living                          Prior Function            PT Goals (current goals can now be found in the care plan section) Acute Rehab PT Goals Patient Stated Goal: pt unable to participate in goal setting PT Goal Formulation: Patient unable to participate in goal setting Time For Goal Achievement: 12/26/23 Potential to Achieve Goals: Poor Progress towards PT goals: Not progressing toward  goals - comment (patient requiring more assistance)    Frequency    Min 2X/week      PT Plan      Co-evaluation PT/OT/SLP Co-Evaluation/Treatment: Yes Reason for Co-Treatment: For patient/therapist safety;To address functional/ADL transfers PT goals addressed during session: Mobility/safety with mobility;Balance        AM-PAC PT 6 Clicks Mobility   Outcome Measure  Help needed turning from your back to your side while in a flat bed without using bedrails?: Total Help needed moving from lying on your back to sitting on the side of a flat bed without using bedrails?: Total Help needed moving to and from a bed to a chair (including a wheelchair)?: Total Help needed standing up from a chair using your arms (e.g., wheelchair or bedside chair)?: Total Help needed to walk in hospital room?: Total Help needed climbing 3-5 steps with a railing? : Total 6 Click Score: 6    End of Session   Activity Tolerance: Patient limited by fatigue;Patient limited by lethargy Patient left: in bed;with call bell/phone within reach;with bed alarm set Nurse Communication: Mobility status PT Visit Diagnosis: Muscle weakness (generalized) (M62.81);Other abnormalities of gait and mobility (R26.89)     Time: 8990-8974 PT Time Calculation (min) (ACUTE ONLY): 16 min  Charges:    $Therapeutic Activity: 8-22 mins PT General Charges $$ ACUTE PT VISIT: 1 Visit                     Maryjayne Kleven, PT, GCS 12/15/23,10:54 AM

## 2023-12-15 NOTE — TOC Progression Note (Signed)
 Transition of Care [facility]) - Progression Note    Patient Details  Name: Cassandra Weiss MRN: [id number] Date of Birth: 1932/11/04  Transition of Care [facility] CM/SW Contact  [name], RN Phone Number: 12/15/2023, 2:36 PM  Clinical Narrative:      CT results pending. To be determined if patient will need IV antibiotics at discharge  Reviewed discission with son [name], who discussed with patient spouse.  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  In patient's condition they would like to pursue SNF.  They are aware that she would be in her copay days.   Their preference is [facility] sent for signature. Bed search initiated                    Expected Discharge Plan and Services                                               Social Drivers of Health (SDOH) Interventions SDOH Screenings   Food Insecurity: No Food Insecurity (12/09/2023)  Housing: Low Risk  (12/09/2023)  Transportation Needs: No Transportation Needs (12/09/2023)  Utilities: Not At Risk (12/09/2023)  Financial Resource Strain: Low Risk  (08/17/2023)   Received from [facility]  Physical Activity: Insufficiently Active (01/13/2018)  Social Connections: Moderately Integrated (12/09/2023)  Stress: No Stress Concern Present (01/13/2018)  Tobacco Use: Low Risk  (12/13/2023)    Readmission Risk Interventions    12/13/2023    1:01 PM  Readmission Risk Prevention Plan  Transportation Screening Complete  HRI or Home Care Consult Complete  Palliative Care Screening Complete  Medication Review (RN Care Manager) Complete

## 2023-12-15 NOTE — Progress Notes (Signed)
 Palliative Care Progress Note, Assessment & Plan   Patient Name: Cassandra Weiss       Date: 12/15/2023 DOB: February 24, 1933  Age: 89 y.o. MRN#: [id number] Attending Physician: Jens Durand, MD Primary Care Physician: Sadie Manna, MD Admit Date: 12/08/2023  Subjective: Patient is lying in bed, asleep.  She does not awaken to my light touch or verbal stimuli.  Respirations are even and unlabored.  Patient's son Cassandra Weiss is at bedside during my visit.  HPI: Cassandra Weiss is a 89 y.o. female with medical history significant of atrial fibrillation, hypertension, urge incontinence, hyperlipidemia coronary artery disease, iron deficiency anemia who was brought to the ER with altered mental status and hypotension.  Patient apparently has been less responsive for about 2 days.  Was also found to be hypotensive at home was blood pressure 88/40.  Patient has chronic indwelling catheter.     She was found to have met sepsis criteria with hypothermia temperature 95.6, blood pressure is 93/42 here she has a sodium of 156 hemoglobin 9.3 chloride 115, BUN is 50 creatinine 1.71.  Patient is on warfarin and INR is 8.8 glucose 104.  Acute viral screen is negative.  Analysis positive for UTI.  Patient also had CT renal stone that showed nonobstructive stone otherwise.  Patient is being admitted for sepsis due to UTI.   PMT was consulted to support patient and family with goals of care discussions  Summary of counseling/coordination of care: Extensive chart review completed prior to meeting patient including labs, vital signs, imaging, progress notes, orders, and available advanced directive documents from current and previous encounters.   After reviewing the patient's chart and assessing the patient at bedside, patient remains  unable to participate in goals of care or medical decision making at this time.  Entirety of discussion was held with patient's son Cassandra Weiss. I spoke with Cassandra Weiss in regards to symptom management and goals of care.   He shares he is grateful that the test from yesterday (TEE) turned out good.  Education provided on TEE, potential for vegetation, and that test was within normal limits with no signs of infection.  Additionally, discussed potential that patient has a septic right arm.  Education provided on septic joints, effusions, and potential for aspiration or further intervention.  Discussed potential patient may need extended IV antibiotic.  If she does have a septic.  Awaiting results of CT scan and orthopedic recommendations.  A lot continues to endorse that he would like to await the results of medical test before making decisions as far as next steps for his mother.  He shares that the goal is to take her home but if she requires medical attention that cannot be provided at home then he is open to SNF for rehab.  I assured him that [facility] is following closely for discharge planning.  No change to plan of care at this time.  Awaiting CT results and Ortho recommendations.  Ongoing support and discussions to continue with PMT.  Physical Exam Vitals reviewed.  Constitutional:      General: She is not in acute distress.    Appearance: She is normal weight.  HENT:     Head: Normocephalic.  Mouth/Throat:     Mouth: Mucous membranes are moist.  Pulmonary:     Effort: Pulmonary effort is normal.  Abdominal:     Palpations: Abdomen is soft.  Musculoskeletal:     Comments: Generalized weakness  Skin:    General: Skin is warm and dry.  Psychiatric:        Behavior: Behavior normal.             Visit includes: Detailed review of medical records (labs, imaging, vital signs), medically appropriate exam (mental status, respiratory, cardiac, skin), discussed with treatment team, counseling and  educating patient, family and staff, documenting clinical information, medication management and coordination of care.  Lamarr L. Arvid, DNP, FNP-BC Palliative Medicine Team

## 2023-12-15 NOTE — Plan of Care (Signed)
  Problem: Fluid Volume: Goal: Hemodynamic stability will improve Outcome: Progressing   Problem: Clinical Measurements: Goal: Diagnostic test results will improve Outcome: Progressing Goal: Signs and symptoms of infection will decrease Outcome: Progressing   Problem: Respiratory: Goal: Ability to maintain adequate ventilation will improve Outcome: Progressing   Problem: Education: Goal: Knowledge of General Education information will improve Description: Including pain rating scale, medication(s)/side effects and non-pharmacologic comfort measures Outcome: Not Progressing   Problem: Health Behavior/Discharge Planning: Goal: Ability to manage health-related needs will improve Outcome: Not Progressing   Problem: Clinical Measurements: Goal: Ability to maintain clinical measurements within normal limits will improve Outcome: Progressing Goal: Will remain free from infection Outcome: Progressing Goal: Diagnostic test results will improve Outcome: Progressing Goal: Respiratory complications will improve Outcome: Progressing Goal: Cardiovascular complication will be avoided Outcome: Progressing   Problem: Activity: Goal: Risk for activity intolerance will decrease Outcome: Not Progressing   Problem: Nutrition: Goal: Adequate nutrition will be maintained Outcome: Progressing   Problem: Coping: Goal: Level of anxiety will decrease Outcome: Progressing   Problem: Elimination: Goal: Will not experience complications related to bowel motility Outcome: Progressing Goal: Will not experience complications related to urinary retention Outcome: Progressing   Problem: Pain Managment: Goal: General experience of comfort will improve and/or be controlled Outcome: Progressing   Problem: Safety: Goal: Ability to remain free from injury will improve Outcome: Progressing   Problem: Skin Integrity: Goal: Risk for impaired skin integrity will decrease Outcome: Progressing    Problem: Safety: Goal: Non-violent Restraint(s) Outcome: Progressing

## 2023-12-15 NOTE — Progress Notes (Signed)
 Progress Note    Cassandra Weiss  FMW:[id number] DOB: 07/11/1932  DOA: 12/08/2023 PCP: Cassandra Manna, MD      Brief Narrative:    Medical records reviewed and are as summarized below:  Cassandra Weiss is a 89 y.o. female  with medical history significant of atrial fibrillation, hypertension, urge incontinence, chronic indwelling Foley catheter, hyperlipidemia coronary artery disease, history of stroke, iron deficiency anemia who was brought to the ER with altered mental status (less responsive) and hypotension.   Vital signs in the ED: Temperature 96.3 F and 95.6 F (per rectum), respiratory rate 24, pulse 85, BP 116/64 and dropped to 93/42, O2 sat 98% on room air.  Diagnostic data significant for sodium 156, BUN 50, creatinine 1.71, albumin 2.7, WBC 8.5, hemoglobin 9.3, platelet count 334, INR 8.8, urinalysis positive for UTI.  Chest x-ray and CT head did not show any acute abnormality.  CT renal stone: IMPRESSION: 1. Trace to small left and trace right pleural effusions. 2. Cardiomegaly. 3. Small hiatal hernia. 4. Nonobstructive 4 mm left nephrolithiasis. 5. Marked L5-S1 disc bulge. 6. Otherwise limited evaluation on this noncontrast study. 7.  Aortic Atherosclerosis (ICD10-I70.0).    She was admitted to the hospital for hypernatremia, Coumadin  coagulopathy with supratherapeutic INR, sepsis secondary to acute UTI.       Assessment/Plan:   Principal Problem:   Sepsis secondary to UTI [facility] [place]) Active Problems:   Acute urinary tract infection   MSSA bacteremia   AKI (acute kidney injury)   Hypernatremia   Acute metabolic encephalopathy   Benign essential HTN   Paroxysmal A-fib (HCC)   Hypercoagulable state   Coronary artery disease   Iron deficiency anemia   Hyperlipidemia   Urge urinary incontinence   Altered mental status   AMS (altered mental status)   Hypokalemia   Nutrition Problem: Increased nutrient needs Etiology: wound  healing  Signs/Symptoms: estimated needs   Body mass index is 24.07 kg/m.   Sepsis secondary to acute UTI, MSSA bacteremia: Urine culture showed pansensitive E. coli.  Blood culture showed MSSA.  No growth on repeat blood cultures from 12/11/2022. 2D echo showed preserved EF. S/p TEE on 12/14/2023 did not show any vegetations. Continue IV Ancef .   Dr. Fayette, ID specialist, recommended orthopedic consult for evaluation of severe right shoulder arthritis to exclude septic arthritis.  Consulted Dr. Edie, orthopedic surgeon, to assist with management. Follow-up with ID for further recommendations.   AKI: Improved Hypernatremia: Improved Hypokalemia: Continue potassium repletion.   Vomiting: Antiemetics as needed. She is having bowel movements.   Acute metabolic encephalopathy: Improved but suspect patient has baseline cognitive impairment.   Paroxysmal atrial fibrillation, history of stroke: Warfarin on hold Warfarin coagulopathy: Improved.  INR is 3.5.  Continue to monitor INR and adjust Coumadin  accordingly.  INR went up as high as 10.1.   Comorbidities include CAD, hyperlipidemia, urge incontinence, chronic indwelling Foley catheter (replaced on day of admission, 12/08/2023)   General Weakness: PT recommended discharge to SNF.  However, Cassandra Weiss, son, prefers that patient be discharged home.     Diet Order             Diet regular Fluid consistency: Thin  Diet effective now                                  Consultants: Cardiologist ID specialist  Procedures: TEE on 12/14/2023    Medications:  amLODipine   5 mg Oral Daily   vitamin C   500 mg Oral BID   atorvastatin   80 mg Oral Daily   carvedilol   25 mg Oral BID   Chlorhexidine  Gluconate Cloth  6 each Topical Daily   feeding supplement  237 mL Oral TID BM   gabapentin   300 mg Oral TID   hydrALAZINE   50 mg Oral TID   lidocaine   1 patch Transdermal Q24H   multivitamin with minerals  1  tablet Oral Daily   sodium chloride  flush  10-40 mL Intracatheter Q12H   Warfarin - Pharmacist Dosing Inpatient   Does not apply q1600   zinc  sulfate (50mg  elemental zinc )  220 mg Oral Daily   Continuous Infusions:  sodium chloride  10 mL/hr at 12/14/23 1323    ceFAZolin  (ANCEF ) IV       Anti-infectives (From admission, onward)    Start     Dose/Rate Route Frequency Ordered Stop   12/15/23 2200  ceFAZolin  (ANCEF ) IVPB 2g/100 mL premix        2 g 200 mL/hr over 30 Minutes Intravenous Every 12 hours 12/15/23 0845     12/09/23 2200  levofloxacin  (LEVAQUIN ) IVPB 500 mg  Status:  Discontinued        500 mg 100 mL/hr over 60 Minutes Intravenous Every 24 hours 12/09/23 2123 12/11/23 1329   12/09/23 1800  cefTRIAXone  (ROCEPHIN ) 2 g in sodium chloride  0.9 % 100 mL IVPB  Status:  Discontinued        2 g 200 mL/hr over 30 Minutes Intravenous Every 24 hours 12/08/23 1907 12/09/23 1626   12/09/23 1800  ceFAZolin  (ANCEF ) IVPB 2g/100 mL premix  Status:  Discontinued        2 g 200 mL/hr over 30 Minutes Intravenous Every 8 hours 12/09/23 1626 12/15/23 0845   12/08/23 1745  cefTRIAXone  (ROCEPHIN ) 2 g in sodium chloride  0.9 % 100 mL IVPB        2 g 200 mL/hr over 30 Minutes Intravenous Once 12/08/23 1730 12/08/23 1821              Family Communication/Anticipated D/C date and plan/Code Status   DVT prophylaxis:      Code Status: Do not attempt resuscitation (DNR) PRE-ARREST INTERVENTIONS DESIRED  Family Communication: Plan discussed with Mr. Cassandra Weiss, son, over the phone Disposition Plan: Plan to discharge home   Status is: Inpatient Remains inpatient appropriate because: MSSA bacteremia       Subjective:   Interval events noted.  It appears patient had vomited this morning as she was found to have vomit on her chest.  She does not report any complaints.  Objective:    Vitals:   12/14/23 1532 12/14/23 2045 12/15/23 0354 12/15/23 0745  BP: 124/68 (!) 126/59 (!)  103/46 (!) 116/48  Pulse: 76 82 81 76  Resp: 18 20 16 15   Temp: (!) 97.5 F (36.4 C) 98.2 F (36.8 C) 98.2 F (36.8 C) 98.2 F (36.8 C)  TempSrc: Oral Oral Oral   SpO2: 100% 95% 96% 100%  Weight:       No data found.   Intake/Output Summary (Last 24 hours) at 12/15/2023 1314 Last data filed at 12/14/2023 1334 Gross per 24 hour  Intake 100 ml  Output --  Net 100 ml   Filed Weights   12/11/23 1101  Weight: 59.7 kg    Exam:  GEN: NAD SKIN: Warm and dry EYES: No pallor or icterus ENT: MMM CV: RRR PULM: CTA B  ABD: soft, ND, NT, +BS CNS: AAO x 1 (person) slurred speech at baseline EXT: No edema or tenderness   Data Reviewed:   I have personally reviewed following labs and imaging studies:  Labs: Labs show the following:   Basic Metabolic Panel: Recent Labs  Lab 12/10/23 1023 12/11/23 0638 12/13/23 0522 12/14/23 0632 12/15/23 0518  NA 148* 144 142 143 141  K 3.1* 3.5 3.6 3.3* 3.4*  CL 111 110 108 107 106  CO2 26 26 24 24 24   GLUCOSE 94 114* 81 99 72  BUN 27* 20 12 11 13   CREATININE 1.15* 1.17* 1.03* 1.05* 1.14*  CALCIUM  8.5* 8.7* 8.4* 8.6* 8.2*  MG 2.1  --   --   --   --   PHOS  --  2.3*  --   --   --    GFR Estimated Creatinine Clearance: 26.5 mL/min (A) (by C-G formula based on SCr of 1.14 mg/dL (H)). Liver Function Tests: Recent Labs  Lab 12/08/23 1515 12/09/23 0520 12/11/23 0638  AST 24 21  --   ALT 14 13  --   ALKPHOS 48 49  --   BILITOT 0.8 1.0  --   PROT 6.5 6.2*  --   ALBUMIN 2.7* 2.6* 2.2*   Recent Labs  Lab 12/08/23 1515  LIPASE 49   No results for input(s): AMMONIA in the last 168 hours. Coagulation profile Recent Labs  Lab 12/11/23 0638 12/12/23 0504 12/13/23 0522 12/14/23 0632 12/15/23 0518  INR 2.8* 3.3* 3.5* 3.2* 3.5*    CBC: Recent Labs  Lab 12/08/23 1515 12/09/23 0520 12/10/23 1023 12/11/23 0638 12/13/23 0522 12/14/23 0632  WBC 8.5 8.8 7.8 8.0 8.5 8.3  NEUTROABS 6.5  --   --   --   --   --   HGB  9.3* 7.9* 7.2* 9.2* 9.9* 9.1*  HCT 31.1* 27.0* 23.7* 28.0* 31.6* 28.8*  MCV 89.1 90.0 88.8 86.7 88.0 87.8  PLT 334 296 242 249 248 217   Cardiac Enzymes: No results for input(s): CKTOTAL, CKMB, CKMBINDEX, TROPONINI in the last 168 hours. BNP (last 3 results) No results for input(s): PROBNP in the last 8760 hours. CBG: Recent Labs  Lab 12/10/23 1425  GLUCAP 100*   D-Dimer: No results for input(s): DDIMER in the last 72 hours. Hgb A1c: No results for input(s): HGBA1C in the last 72 hours. Lipid Profile: No results for input(s): CHOL, HDL, LDLCALC, TRIG, CHOLHDL, LDLDIRECT in the last 72 hours. Thyroid  function studies: No results for input(s): TSH, T4TOTAL, T3FREE, THYROIDAB in the last 72 hours.  Invalid input(s): FREET3 Anemia work up: No results for input(s): VITAMINB12, FOLATE, FERRITIN, TIBC, IRON, RETICCTPCT in the last 72 hours. Sepsis Labs: Recent Labs  Lab 12/08/23 1717 12/09/23 0520 12/10/23 1023 12/11/23 9361 12/13/23 0522 12/14/23 0632  WBC  --    < > 7.8 8.0 8.5 8.3  LATICACIDVEN 1.7  --   --   --   --   --    < > = values in this interval not displayed.    Microbiology Recent Results (from the past 240 hours)  Resp panel by RT-PCR (RSV, Flu A&B, Covid) Anterior Nasal Swab     Status: None   Collection Time: 12/08/23  2:56 PM   Specimen: Anterior Nasal Swab  Result Value Ref Range Status   SARS Coronavirus 2 by RT PCR NEGATIVE NEGATIVE Final    Comment: (NOTE) SARS-CoV-2 target nucleic acids are NOT DETECTED.  The SARS-CoV-2 RNA is  generally detectable in upper respiratory specimens during the acute phase of infection. The lowest concentration of SARS-CoV-2 viral copies this assay can detect is 138 copies/mL. A negative result does not preclude SARS-Cov-2 infection and should not be used as the sole basis for treatment or other patient management decisions. A negative result may occur with  improper  specimen collection/handling, submission of specimen other than nasopharyngeal swab, presence of viral mutation(s) within the areas targeted by this assay, and inadequate number of viral copies(<138 copies/mL). A negative result must be combined with clinical observations, patient history, and epidemiological information. The expected result is Negative.  Fact Sheet for Patients:  BloggerCourse.com  Fact Sheet for Healthcare Providers:  SeriousBroker.it  This test is no t yet approved or cleared by the [place]  FDA and  has been authorized for detection and/or diagnosis of SARS-CoV-2 by FDA under an Emergency Use Authorization (EUA). This EUA will remain  in effect (meaning this test can be used) for the duration of the COVID-19 declaration under Section 564(b)(1) of the Act, 21 U.S.C.section 360bbb-3(b)(1), unless the authorization is terminated  or revoked sooner.       Influenza A by PCR NEGATIVE NEGATIVE Final   Influenza B by PCR NEGATIVE NEGATIVE Final    Comment: (NOTE) The Xpert Xpress SARS-CoV-2/FLU/RSV plus assay is intended as an aid in the diagnosis of influenza from Nasopharyngeal swab specimens and should not be used as a sole basis for treatment. Nasal washings and aspirates are unacceptable for Xpert Xpress SARS-CoV-2/FLU/RSV testing.  Fact Sheet for Patients: BloggerCourse.com  Fact Sheet for Healthcare Providers: SeriousBroker.it  This test is not yet approved or cleared by the [place]  FDA and has been authorized for detection and/or diagnosis of SARS-CoV-2 by FDA under an Emergency Use Authorization (EUA). This EUA will remain in effect (meaning this test can be used) for the duration of the COVID-19 declaration under Section 564(b)(1) of the Act, 21 U.S.C. section 360bbb-3(b)(1), unless the authorization is terminated or revoked.     Resp  Syncytial Virus by PCR NEGATIVE NEGATIVE Final    Comment: (NOTE) Fact Sheet for Patients: BloggerCourse.com  Fact Sheet for Healthcare Providers: SeriousBroker.it  This test is not yet approved or cleared by the [place]  FDA and has been authorized for detection and/or diagnosis of SARS-CoV-2 by FDA under an Emergency Use Authorization (EUA). This EUA will remain in effect (meaning this test can be used) for the duration of the COVID-19 declaration under Section 564(b)(1) of the Act, 21 U.S.C. section 360bbb-3(b)(1), unless the authorization is terminated or revoked.  Performed at [facility], [address] [place], [place] [address]   Urine Culture (for pregnant, neutropenic or urologic patients or patients with an indwelling urinary catheter)     Status: Abnormal   Collection Time: 12/08/23  4:33 PM   Specimen: Urine, Catheterized  Result Value Ref Range Status   Specimen Description   Final    URINE, CATHETERIZED Performed at [facility], [address] [place], [place] [address]    Special Requests   Final    NONE Performed at [facility], [address] [place], [place] [address]    Culture >=100,000 COLONIES/mL ESCHERICHIA COLI (A)  Final   Report Status 12/11/2023 FINAL  Final   Organism ID, Bacteria ESCHERICHIA COLI (A)  Final      Susceptibility   Escherichia coli - MIC*    AMPICILLIN 4 SENSITIVE Sensitive     CEFAZOLIN  (URINE) Value in next  row Sensitive      <=1 SENSITIVEThis is a modified FDA-approved test that has been validated and its performance characteristics determined by the reporting laboratory.  This laboratory is certified under the Clinical Laboratory Improvement Amendments CLIA as qualified to perform high complexity clinical laboratory testing.    CEFEPIME Value in next row Sensitive      <=1 SENSITIVEThis is a modified FDA-approved test that has been validated  and its performance characteristics determined by the reporting laboratory.  This laboratory is certified under the Clinical Laboratory Improvement Amendments CLIA as qualified to perform high complexity clinical laboratory testing.    ERTAPENEM Value in next row Sensitive      <=1 SENSITIVEThis is a modified FDA-approved test that has been validated and its performance characteristics determined by the reporting laboratory.  This laboratory is certified under the Clinical Laboratory Improvement Amendments CLIA as qualified to perform high complexity clinical laboratory testing.    CEFTRIAXONE  Value in next row Sensitive      <=1 SENSITIVEThis is a modified FDA-approved test that has been validated and its performance characteristics determined by the reporting laboratory.  This laboratory is certified under the Clinical Laboratory Improvement Amendments CLIA as qualified to perform high complexity clinical laboratory testing.    CIPROFLOXACIN Value in next row Sensitive      <=1 SENSITIVEThis is a modified FDA-approved test that has been validated and its performance characteristics determined by the reporting laboratory.  This laboratory is certified under the Clinical Laboratory Improvement Amendments CLIA as qualified to perform high complexity clinical laboratory testing.    GENTAMICIN Value in next row Sensitive      <=1 SENSITIVEThis is a modified FDA-approved test that has been validated and its performance characteristics determined by the reporting laboratory.  This laboratory is certified under the Clinical Laboratory Improvement Amendments CLIA as qualified to perform high complexity clinical laboratory testing.    NITROFURANTOIN Value in next row Sensitive      <=1 SENSITIVEThis is a modified FDA-approved test that has been validated and its performance characteristics determined by the reporting laboratory.  This laboratory is certified under the Clinical Laboratory Improvement Amendments  CLIA as qualified to perform high complexity clinical laboratory testing.    TRIMETH/SULFA Value in next row Sensitive      <=1 SENSITIVEThis is a modified FDA-approved test that has been validated and its performance characteristics determined by the reporting laboratory.  This laboratory is certified under the Clinical Laboratory Improvement Amendments CLIA as qualified to perform high complexity clinical laboratory testing.    AMPICILLIN/SULBACTAM Value in next row Sensitive      <=1 SENSITIVEThis is a modified FDA-approved test that has been validated and its performance characteristics determined by the reporting laboratory.  This laboratory is certified under the Clinical Laboratory Improvement Amendments CLIA as qualified to perform high complexity clinical laboratory testing.    PIP/TAZO Value in next row Sensitive      <=4 SENSITIVEThis is a modified FDA-approved test that has been validated and its performance characteristics determined by the reporting laboratory.  This laboratory is certified under the Clinical Laboratory Improvement Amendments CLIA as qualified to perform high complexity clinical laboratory testing.    MEROPENEM Value in next row Sensitive      <=4 SENSITIVEThis is a modified FDA-approved test that has been validated and its performance characteristics determined by the reporting laboratory.  This laboratory is certified under the Clinical Laboratory Improvement Amendments CLIA as qualified to perform high complexity clinical laboratory testing.    * >=  100,000 COLONIES/mL ESCHERICHIA COLI  Blood culture (routine x 2)     Status: Abnormal   Collection Time: 12/08/23  5:17 PM   Specimen: BLOOD  Result Value Ref Range Status   Specimen Description   Final    BLOOD BLOOD RIGHT FOREARM Performed at [facility], [address] [place], [place] [address]    Special Requests   Final    BOTTLES DRAWN AEROBIC AND ANAEROBIC Blood Culture results may not be optimal  due to an inadequate volume of blood received in culture bottles Performed at [facility], [address] [place], [place] [address]    Culture  Setup Time   Final    GRAM POSITIVE COCCI ANAEROBIC BOTTLE ONLY Organism ID to follow CRITICAL RESULT CALLED TO, READ BACK BY AND VERIFIED WITHBETHA MAFFUCCI [facility] AT 1042 12/09/23 RAM Performed at [facility], [address], [place], [place] [address]    Culture STAPHYLOCOCCUS AUREUS (A)  Final   Report Status 12/11/2023 FINAL  Final   Organism ID, Bacteria STAPHYLOCOCCUS AUREUS  Final      Susceptibility   Staphylococcus aureus - MIC*    CIPROFLOXACIN <=0.5 SENSITIVE Sensitive     ERYTHROMYCIN <=0.25 SENSITIVE Sensitive     GENTAMICIN <=0.5 SENSITIVE Sensitive     OXACILLIN <=0.25 SENSITIVE Sensitive     TETRACYCLINE <=1 SENSITIVE Sensitive     VANCOMYCIN 1 SENSITIVE Sensitive     TRIMETH/SULFA <=10 SENSITIVE Sensitive     CLINDAMYCIN <=0.25 SENSITIVE Sensitive     RIFAMPIN <=0.5 SENSITIVE Sensitive     Inducible Clindamycin NEGATIVE Sensitive     LINEZOLID 2 SENSITIVE Sensitive     * STAPHYLOCOCCUS AUREUS  Blood Culture ID Panel (Reflexed)     Status: Abnormal   Collection Time: 12/08/23  5:17 PM  Result Value Ref Range Status   Enterococcus faecalis NOT DETECTED NOT DETECTED Final   Enterococcus Faecium NOT DETECTED NOT DETECTED Final   Listeria monocytogenes NOT DETECTED NOT DETECTED Final   Staphylococcus species DETECTED (A) NOT DETECTED Final    Comment: CRITICAL RESULT CALLED TO, READ BACK BY AND VERIFIED WITH: MAFFUCCI CLOSE AT 1042 12/09/23 RAM    Staphylococcus aureus (BCID) DETECTED (A) NOT DETECTED Final    Comment: CRITICAL RESULT CALLED TO, READ BACK BY AND VERIFIED WITH: MAFFUCCI CLOSE AT 1042 12/09/23 RAM    Staphylococcus epidermidis NOT DETECTED NOT DETECTED Final   Staphylococcus lugdunensis NOT DETECTED NOT DETECTED Final   Streptococcus species NOT DETECTED NOT DETECTED Final    Streptococcus agalactiae NOT DETECTED NOT DETECTED Final   Streptococcus pneumoniae NOT DETECTED NOT DETECTED Final   Streptococcus pyogenes NOT DETECTED NOT DETECTED Final   A.calcoaceticus-baumannii NOT DETECTED NOT DETECTED Final   Bacteroides fragilis NOT DETECTED NOT DETECTED Final   Enterobacterales NOT DETECTED NOT DETECTED Final   Enterobacter cloacae complex NOT DETECTED NOT DETECTED Final   Escherichia coli NOT DETECTED NOT DETECTED Final   Klebsiella aerogenes NOT DETECTED NOT DETECTED Final   Klebsiella oxytoca NOT DETECTED NOT DETECTED Final   Klebsiella pneumoniae NOT DETECTED NOT DETECTED Final   Proteus species NOT DETECTED NOT DETECTED Final   Salmonella species NOT DETECTED NOT DETECTED Final   Serratia marcescens NOT DETECTED NOT DETECTED Final   Haemophilus influenzae NOT DETECTED NOT DETECTED Final   Neisseria meningitidis NOT DETECTED NOT DETECTED Final   Pseudomonas aeruginosa NOT DETECTED NOT DETECTED Final   Stenotrophomonas maltophilia NOT DETECTED NOT DETECTED Final   Candida albicans NOT DETECTED NOT  DETECTED Final   Candida auris NOT DETECTED NOT DETECTED Final   Candida glabrata NOT DETECTED NOT DETECTED Final   Candida krusei NOT DETECTED NOT DETECTED Final   Candida parapsilosis NOT DETECTED NOT DETECTED Final   Candida tropicalis NOT DETECTED NOT DETECTED Final   Cryptococcus neoformans/gattii NOT DETECTED NOT DETECTED Final   Meth resistant mecA/C and MREJ NOT DETECTED NOT DETECTED Final    Comment: Performed at [facility], [address] [place], [place] [address]  Blood culture (routine x 2)     Status: None   Collection Time: 12/08/23  7:08 PM   Specimen: BLOOD  Result Value Ref Range Status   Specimen Description BLOOD BLOOD LEFT ARM  Final   Special Requests   Final    BOTTLES DRAWN AEROBIC AND ANAEROBIC Blood Culture adequate volume   Culture   Final    NO GROWTH 5 DAYS Performed at [facility], [address]  [place], [place] [address]    Report Status 12/13/2023 FINAL  Final  Culture, blood (Routine X 2) w Reflex to ID Panel     Status: None (Preliminary result)   Collection Time: 12/11/23  6:38 AM   Specimen: BLOOD  Result Value Ref Range Status   Specimen Description BLOOD LEFT ANTECUBITAL  Final   Special Requests   Final    BOTTLES DRAWN AEROBIC AND ANAEROBIC Blood Culture results may not be optimal due to an inadequate volume of blood received in culture bottles   Culture   Final    NO GROWTH 4 DAYS Performed at [facility], [address] [place], [place] [address]    Report Status PENDING  Incomplete  Culture, blood (Routine X 2) w Reflex to ID Panel     Status: None (Preliminary result)   Collection Time: 12/11/23  6:38 AM   Specimen: BLOOD LEFT HAND  Result Value Ref Range Status   Specimen Description BLOOD LEFT HAND  Final   Special Requests   Final    BOTTLES DRAWN AEROBIC AND ANAEROBIC Blood Culture adequate volume   Culture   Final    NO GROWTH 4 DAYS Performed at [facility], [address] [place], [place] [address]    Report Status PENDING  Incomplete    Procedures and diagnostic studies:  ECHO TEE Result Date: 12/14/2023    TRANSESOPHOGEAL ECHO REPORT   Patient Name:   KELCEE BJORN Date of Exam: 12/14/2023 Medical Rec #:  [id number]     Height:       62.0 in Accession #:    [id number]    Weight:       131.6 lb Date of Birth:  September 05, 1932     BSA:          1.600 m Patient Age:    89 years      BP:           123/68 mmHg Patient Gender: F             HR:           88 bpm. Exam Location:  [facility] Procedure: Transesophageal Echo and Color Doppler (Both Spectral and Color Flow            Doppler were utilized during procedure). Indications:     Endocarditis  History:         Patient has prior history of Echocardiogram examinations, most  recent 12/12/2023. Endocarditis.  Sonographer:     Ashley McNeely-Sloane Referring Phys:  [id number] CARALYN HUDSON  Diagnosing Phys: Keller Paterson PROCEDURE: After discussion of the risks and benefits of a TEE, an informed consent was obtained from a family member. The transesophogeal probe was passed without difficulty through the esophogus of the patient. Sedation performed by different physician. The patient was monitored while under deep sedation. Image quality was excellent. The patient's vital signs; including heart rate, blood pressure, and oxygen saturation; remained stable throughout the procedure. The patient developed no complications during the procedure.  IMPRESSIONS  1. Left ventricular ejection fraction, by estimation, is 55 to 60%. The left ventricle has normal function.  2. Right ventricular systolic function is normal. The right ventricular size is normal.  3. Left atrial size was dilated. No left atrial/left atrial appendage thrombus was detected.  4. The mitral valve is normal in structure. Trivial mitral valve regurgitation.  5. The aortic valve is tricuspid. Aortic valve regurgitation is not visualized. Aortic valve sclerosis/calcification is present, without any evidence of aortic stenosis. Conclusion(s)/Recommendation(s): No evidence of vegetation/infective endocarditis on this transesophageael echocardiogram. FINDINGS  Left Ventricle: Left ventricular ejection fraction, by estimation, is 55 to 60%. The left ventricle has normal function. The left ventricular internal cavity size was normal in size. Right Ventricle: The right ventricular size is normal. No increase in right ventricular wall thickness. Right ventricular systolic function is normal. Left Atrium: Left atrial size was dilated. No left atrial/left atrial appendage thrombus was detected. Right Atrium: Right atrial size was normal in size. Pericardium: There is no evidence of pericardial effusion. Mitral Valve: The mitral valve is normal in structure. Trivial mitral valve regurgitation. Tricuspid Valve: The tricuspid valve is normal in  structure. Tricuspid valve regurgitation is mild. Aortic Valve: The aortic valve is tricuspid. Aortic valve regurgitation is not visualized. Aortic valve sclerosis/calcification is present, without any evidence of aortic stenosis. Pulmonic Valve: The pulmonic valve was normal in structure. Pulmonic valve regurgitation is trivial. Aorta: The aortic root is normal in size and structure. IAS/Shunts: No atrial level shunt detected by color flow Doppler. Keller Paterson Electronically signed by Keller Paterson Signature Date/Time: 12/14/2023/5:41:34 PM    Final                LOS: 7 days   Serge Main  [facility]   Pager on [email or web address]. If 7PM-7AM, please contact night-coverage at [email or web address]     12/15/2023, 1:14 PM

## 2023-12-15 NOTE — Progress Notes (Signed)
 Occupational Therapy Treatment Patient Details Name: Cassandra Weiss MRN: [id number] DOB: [birth year] Today's Date: 12/15/2023   History of present illness Pt is an 89 y.o. female who was brought to the ER with altered mental status and hypotension. She had been less responsive for about 2 days. Pt admitted for management of sepsis secondary to UTI. PMH of atrial fibrillation, hypertension, urge incontinence,  chronic indwelling catheter, hyperlipidemia coronary artery disease, iron deficiency anemia   OT comments  Pt is supine in bed on arrival with husband and nurse at bedside. Lethagic, but arousable with verbal cues and agreeable to OT session. She continues to be limited by R shoulder pain. Increased assist needed this date likely d/t lethargy. Max A x1-2 required for all bed mobility with pt only able to tolerate ~10 mins seated EOB with Mod/Max A to maintain seated balance with R lateral lean noted and pt unable to correct despite cues. She demo ability to drink from a cup with her LUE with supervision and cues. Noted with incont BM required pads to be changed and cleanup of pt. Rolled to bil sides with Max A and total A for bed level peri-care. Pt left in bed with all needs in place and will cont to require skilled acute OT services to maximize her safety and IND to return to PLOF.       If plan is discharge home, recommend the following:  A lot of help with bathing/dressing/bathroom;Help with stairs or ramp for entrance;Assist for transportation;Assistance with cooking/housework;Two people to help with walking and/or transfers   Equipment Recommendations  Other (comment) (defer)    Recommendations for Other Services      Precautions / Restrictions Precautions Precautions: Fall Recall of Precautions/Restrictions: Impaired Restrictions Weight Bearing Restrictions Per Provider Order: No       Mobility Bed Mobility Overal bed mobility: Needs Assistance Bed Mobility: Supine to Sit,  Sit to Supine     Supine to sit: Total assist, HOB elevated, Max assist Sit to supine: Max assist, +2 for physical assistance   General bed mobility comments: pt attempted initiation of BLEs, however ultimately needed Max A to reach EOB with cues for bed rail use; noted with R lateral lean, but able to sit up ~10 mins with Mod/Max A before needing Max A x2 to return to supine    Transfers                   General transfer comment: deferred d/t poor seated balance     Balance Overall balance assessment: Needs assistance Sitting-balance support: Feet supported, Single extremity supported Sitting balance-Leahy Scale: Poor Sitting balance - Comments: Mod/Max A with R lateral lean while sitting EOB this date Postural control: Posterior lean, Right lateral lean                                 ADL either performed or assessed with clinical judgement   ADL Overall ADL's : Needs assistance/impaired Eating/Feeding: Supervision/ safety;Cueing for safety;Sitting Eating/Feeding Details (indicate cue type and reason): able to drink from cup seated EOB with LUE and cues for initiation                         Toileting- Clothing Manipulation and Hygiene: Bed level;Total assistance;Maximal assistance Toileting - Clothing Manipulation Details (indicate cue type and reason): incont BM in bed requiring total/Max A for cleanup at bed level  Extremity/Trunk Assessment              Occupational psychologist Communication: Impaired Factors Affecting Communication: Difficulty expressing self;Reduced clarity of speech   Cognition Arousal: Lethargic Behavior During Therapy: Flat affect Cognition: Cognition impaired   Orientation impairments: Place, Time, Situation                           Following commands: Impaired Following commands impaired: Follows one step commands inconsistently,  Follows one step commands with increased time      Cueing   Cueing Techniques: Verbal cues, Gestural cues  Exercises      Shoulder Instructions       General Comments pt lethargic and minimally verbal throughout session    Pertinent Vitals/ Pain       Pain Assessment Pain Assessment: Faces Faces Pain Scale: No hurt Pain Intervention(s): Monitored during session  Home Living                                          Prior Functioning/Environment              Frequency  Min 2X/week        Progress Toward Goals  OT Goals(current goals can now be found in the care plan section)  Progress towards OT goals: Progressing toward goals  Acute Rehab OT Goals OT Goal Formulation: Patient unable to participate in goal setting Time For Goal Achievement: 12/26/23 Potential to Achieve Goals: Fair  Plan      Co-evaluation                 AM-PAC OT 6 Clicks Daily Activity     Outcome Measure   Help from another person eating meals?: A Little Help from another person taking care of personal grooming?: A Little Help from another person toileting, which includes using toliet, bedpan, or urinal?: A Lot Help from another person bathing (including washing, rinsing, drying)?: A Lot Help from another person to put on and taking off regular upper body clothing?: A Lot Help from another person to put on and taking off regular lower body clothing?: A Lot 6 Click Score: 14    End of Session    OT Visit Diagnosis: Other abnormalities of gait and mobility (R26.89);Unsteadiness on feet (R26.81);Muscle weakness (generalized) (M62.81)   Activity Tolerance Patient limited by lethargy;Patient limited by fatigue   Patient Left in bed;with call bell/phone within reach;with bed alarm set;with family/visitor present   Nurse Communication Mobility status        Time: 8487-8463 OT Time Calculation (min): 24 min  Charges: OT General Charges $OT Visit: 1  Visit OT Treatments $Self Care/Home Management : 8-22 mins $Therapeutic Activity: 8-22 mins  Babs Dabbs, OTR/L  12/15/23, 4:34 PM   Marquette Blodgett E Lennon Boutwell 12/15/2023, 4:31 PM

## 2023-12-16 ENCOUNTER — Inpatient Hospital Stay: Admitting: Radiology

## 2023-12-16 DIAGNOSIS — A419 Sepsis, unspecified organism: Secondary | ICD-10-CM | POA: Diagnosis not present

## 2023-12-16 DIAGNOSIS — B9620 Unspecified Escherichia coli [E. coli] as the cause of diseases classified elsewhere: Secondary | ICD-10-CM

## 2023-12-16 DIAGNOSIS — M13811 Other specified arthritis, right shoulder: Secondary | ICD-10-CM

## 2023-12-16 DIAGNOSIS — N390 Urinary tract infection, site not specified: Secondary | ICD-10-CM | POA: Diagnosis not present

## 2023-12-16 LAB — CBC
HCT: 26 % — ABNORMAL LOW (ref 36.0–46.0)
Hemoglobin: 8.1 g/dL — ABNORMAL LOW (ref 12.0–15.0)
MCH: 27.6 pg (ref 26.0–34.0)
MCHC: 31.2 g/dL (ref 30.0–36.0)
MCV: 88.7 fL (ref 80.0–100.0)
Platelets: 241 K/uL (ref 150–400)
RBC: 2.93 MIL/uL — ABNORMAL LOW (ref 3.87–5.11)
RDW: 22 % — ABNORMAL HIGH (ref 11.5–15.5)
WBC: 9 K/uL (ref 4.0–10.5)
nRBC: 0 % (ref 0.0–0.2)

## 2023-12-16 LAB — RENAL FUNCTION PANEL
Albumin: 1.9 g/dL — ABNORMAL LOW (ref 3.5–5.0)
Anion gap: 10 (ref 5–15)
BUN: 16 mg/dL (ref 8–23)
CO2: 24 mmol/L (ref 22–32)
Calcium: 8.4 mg/dL — ABNORMAL LOW (ref 8.9–10.3)
Chloride: 108 mmol/L (ref 98–111)
Creatinine, Ser: 1.29 mg/dL — ABNORMAL HIGH (ref 0.44–1.00)
GFR, Estimated: 40 mL/min — ABNORMAL LOW (ref >60)
Glucose, Bld: 82 mg/dL (ref 70–99)
Phosphorus: 2.7 mg/dL (ref 2.5–4.6)
Potassium: 3.7 mmol/L (ref 3.5–5.1)
Sodium: 142 mmol/L (ref 135–145)

## 2023-12-16 LAB — CULTURE, BLOOD (ROUTINE X 2)
Culture: NO GROWTH
Culture: NO GROWTH
Special Requests: ADEQUATE

## 2023-12-16 LAB — MAGNESIUM: Magnesium: 2.1 mg/dL (ref 1.7–2.4)

## 2023-12-16 LAB — PROTIME-INR
INR: 3 — ABNORMAL HIGH (ref 0.8–1.2)
Prothrombin Time: 32.5 s — ABNORMAL HIGH (ref 11.4–15.2)

## 2023-12-16 LAB — BRAIN NATRIURETIC PEPTIDE: B Natriuretic Peptide: 433.4 pg/mL — ABNORMAL HIGH (ref 0.0–100.0)

## 2023-12-16 LAB — VITAMIN A: Vitamin A (Retinoic Acid): 22.2 ug/dL (ref 22.0–69.5)

## 2023-12-16 MED ORDER — LIDOCAINE 1 % OPTIME INJ - NO CHARGE
2.0000 mL | Freq: Once | INTRAMUSCULAR | Status: AC
  Administered 2023-12-16: 2 mL

## 2023-12-16 NOTE — Consult Note (Addendum)
 PHARMACY - ANTICOAGULATION CONSULT NOTE  Pharmacy Consult for warfarin  Indication: Afib   Allergies  Allergen Reactions   Penicillins Rash    Has patient had a PCN reaction causing immediate rash, facial/tongue/throat swelling, SOB or lightheadedness with hypotension: Unknown Has patient had a PCN reaction causing severe rash involving mucus membranes or skin necrosis: Unknown Has patient had a PCN reaction that required hospitalization: Unknown Has patient had a PCN reaction occurring within the last 10 years: Unknown If all of the above answers are NO, then may proceed with Cephalosporin use.     Patient Measurements: Weight: 59.7 kg (131 lb 9.8 oz)  Vital Signs: Temp: 97.8 F (36.6 C) (09/26 0746) Temp Source: Axillary (09/26 0746) BP: 135/67 (09/26 0746) Pulse Rate: 78 (09/26 0746)  Labs: Recent Labs    12/14/23 0632 12/15/23 0518 12/16/23 0630  HGB 9.1*  --  8.1*  HCT 28.8*  --  26.0*  PLT 217  --  241  LABPROT 33.8* 37.0* 32.5*  INR 3.2* 3.5* 3.0*  CREATININE 1.05* 1.14* 1.29*    Estimated Creatinine Clearance: 23.4 mL/min (A) (by C-G formula based on SCr of 1.29 mg/dL (H)).   Medical History: Past Medical History:  Diagnosis Date   Arthritis    Atrial fibrillation (HCC)    Hypertension    Urge urinary incontinence     Medications:  Reported home regimen is Warfarin 1 mg po daily with last dose 12/08/23  Assessment: 89 yo female with a PMH of A.fib, HTN, and CAD presented to ED due to altered mental status. In ED patient found to have supratherapeutic INR.  Pharmacy consulted to manage warfarin dosing.  DDI: Ancef  (increase INR) Diet: NPO, last Ensure given on 9/23  Goal of Therapy:  INR 2-3 Monitor platelets by anticoagulation protocol: Yes   Date INR Warfarin Dose  9/18 8.8 1 mg (PTA)  9/19 10.1 Hold. IV vitamin K  given.   9/20 1.7 1 mg  9/21 2.8 Hold  9/22 3.3 Hold  9/23 3.5 Hold  9/24 3.2 Hold  9/25 3.5 Hold  9/26 3.0 Hold      Plan:  INR is therapeutic with a 0.5 decrease from yesterday after holding home dose x5 days INR trend potentially due to poor diet, (albumin 1.9 on 9/26), and acute illness. Patient placed on NPO status on 9/23-9/24 for TEE. Given INR at highest end of goal and previous INR trend after 1 mg given on 9/20, will continue to HOLD warfarin dose today Daily INR while inpatient and CBC at least every 72 hours  Annabella LOISE Banks, PharmD Clinical Pharmacist 12/16/2023 7:52 AM

## 2023-12-16 NOTE — Progress Notes (Addendum)
 Progress Note    Cassandra Weiss  FMW:[id number] DOB: Jul 31, 1932  DOA: 12/08/2023 PCP: Sadie Manna, MD      Brief Narrative:    Medical records reviewed and are as summarized below:  Cassandra Weiss is a 89 y.o. female  with medical history significant of atrial fibrillation, hypertension, urge incontinence, chronic indwelling Foley catheter, hyperlipidemia coronary artery disease, history of stroke, iron deficiency anemia who was brought to the ER with altered mental status (less responsive) and hypotension.   Vital signs in the ED: Temperature 96.3 F and 95.6 F (per rectum), respiratory rate 24, pulse 85, BP 116/64 and dropped to 93/42, O2 sat 98% on room air.  Diagnostic data significant for sodium 156, BUN 50, creatinine 1.71, albumin 2.7, WBC 8.5, hemoglobin 9.3, platelet count 334, INR 8.8, urinalysis positive for UTI.  Chest x-ray and CT head did not show any acute abnormality.  CT renal stone: IMPRESSION: 1. Trace to small left and trace right pleural effusions. 2. Cardiomegaly. 3. Small hiatal hernia. 4. Nonobstructive 4 mm left nephrolithiasis. 5. Marked L5-S1 disc bulge. 6. Otherwise limited evaluation on this noncontrast study. 7.  Aortic Atherosclerosis (ICD10-I70.0).    She was admitted to the hospital for hypernatremia, Coumadin  coagulopathy with supratherapeutic INR, sepsis secondary to acute UTI.       Assessment/Plan:   Principal Problem:   Sepsis secondary to UTI [facility]) Active Problems:   Acute urinary tract infection   MSSA bacteremia   AKI (acute kidney injury)   Hypernatremia   Acute metabolic encephalopathy   Benign essential HTN   Paroxysmal A-fib (HCC)   Hypercoagulable state   Coronary artery disease   Iron deficiency anemia   Hyperlipidemia   Urge urinary incontinence   Altered mental status   AMS (altered mental status)   Hypokalemia   Nutrition Problem: Increased nutrient needs Etiology: wound  healing  Signs/Symptoms: estimated needs   Body mass index is 24.07 kg/m.   Sepsis secondary to acute UTI, MSSA bacteremia: Urine culture showed pansensitive E. coli.  Blood culture showed MSSA.  No growth on repeat blood cultures from 12/11/2022. 2D echo showed preserved EF. S/p TEE on 12/14/2023 did not show any vegetations. Continue IV Ancef .   She was evaluated by Dr. Edie, orthopedic surgeon. CT right shoulder showed severe glenohumeral degenerative changes with joint effusion and probable small intra-articular loose bodies. Case discussed with Dr. Fayette, ankle specialist.  She recommended aspiration of right joint effusion by IR for culture. Order has been placed for right shoulder joint aspiration.   AKI: Improved Hypernatremia: Improved Hypokalemia: Improved   Vomiting: Antiemetics as needed. She is having bowel movements.   Acute metabolic encephalopathy: Improved but suspect patient has baseline cognitive impairment.   Paroxysmal atrial fibrillation, history of stroke: Warfarin on hold Warfarin coagulopathy: Improved.  INR down to 3.0.  Continue to monitor INR and adjust Coumadin  accordingly.   INR went up as high as 10.1.   Black stools: No overt rectal bleeding or hematemesis.  Check stool for occult blood.   Comorbidities include CAD, severe asymmetric LVH of septal segment on 2D echo, severe hypoalbuminemia, right pleural effusion, hyperlipidemia, urge incontinence, chronic indwelling Foley catheter (replaced on day of admission, 12/08/2023)   General Weakness: PT recommended discharge to SNF.  Her son is contemplating discharge to SNF.   Plan discussed with [name], son, at the bedside.  All his questions were answered.  Diet Order  DIET - DYS 1 Fluid consistency: Thin  Diet effective now                                  Consultants: Cardiologist ID specialist  Procedures: TEE on 12/14/2023    Medications:     amLODipine   5 mg Oral Daily   vitamin C   500 mg Oral BID   atorvastatin   80 mg Oral Daily   carvedilol   25 mg Oral BID   Chlorhexidine  Gluconate Cloth  6 each Topical Daily   feeding supplement  237 mL Oral TID BM   gabapentin   300 mg Oral TID   hydrALAZINE   50 mg Oral TID   lidocaine   1 patch Transdermal Q24H   multivitamin with minerals  1 tablet Oral Daily   sodium chloride  flush  10-40 mL Intracatheter Q12H   Warfarin - Pharmacist Dosing Inpatient   Does not apply q1600   zinc  sulfate (50mg  elemental zinc )  220 mg Oral Daily   Continuous Infusions:   ceFAZolin  (ANCEF ) IV 2 g (12/16/23 0854)     Anti-infectives (From admission, onward)    Start     Dose/Rate Route Frequency Ordered Stop   12/15/23 2200  ceFAZolin  (ANCEF ) IVPB 2g/100 mL premix        2 g 200 mL/hr over 30 Minutes Intravenous Every 12 hours 12/15/23 0845     12/09/23 2200  levofloxacin  (LEVAQUIN ) IVPB 500 mg  Status:  Discontinued        500 mg 100 mL/hr over 60 Minutes Intravenous Every 24 hours 12/09/23 2123 12/11/23 1329   12/09/23 1800  cefTRIAXone  (ROCEPHIN ) 2 g in sodium chloride  0.9 % 100 mL IVPB  Status:  Discontinued        2 g 200 mL/hr over 30 Minutes Intravenous Every 24 hours 12/08/23 1907 12/09/23 1626   12/09/23 1800  ceFAZolin  (ANCEF ) IVPB 2g/100 mL premix  Status:  Discontinued        2 g 200 mL/hr over 30 Minutes Intravenous Every 8 hours 12/09/23 1626 12/15/23 0845   12/08/23 1745  cefTRIAXone  (ROCEPHIN ) 2 g in sodium chloride  0.9 % 100 mL IVPB        2 g 200 mL/hr over 30 Minutes Intravenous Once 12/08/23 1730 12/08/23 1821              Family Communication/Anticipated D/C date and plan/Code Status   DVT prophylaxis:      Code Status: Do not attempt resuscitation (DNR) PRE-ARREST INTERVENTIONS DESIRED  Family Communication: None  Disposition Plan: Plan to discharge to SNF   Status is: Inpatient Remains inpatient appropriate because: MSSA  bacteremia       Subjective:   Interval events noted.  She is confused and cannot provide any history.  Objective:    Vitals:   12/15/23 1959 12/16/23 0032 12/16/23 0352 12/16/23 0746  BP: (!) 112/51 (!) 121/57 106/82 135/67  Pulse: 75 74 79 78  Resp: 16 17 18 16   Temp: 98 F (36.7 C)   97.8 F (36.6 C)  TempSrc:    Axillary  SpO2: 100% 100% 98% 99%  Weight:       No data found.   Intake/Output Summary (Last 24 hours) at 12/16/2023 1315 Last data filed at 12/15/2023 2137 Gross per 24 hour  Intake 364.33 ml  Output 350 ml  Net 14.33 ml   Filed Weights   12/11/23 1101  Weight: 59.7 kg  Exam:   GEN: NAD SKIN: Warm and dry EYES: No pallor or icterus ENT: MMM CV: RRR PULM: CTA B ABD: soft, ND, NT, +BS CNS: Drowsy but arousable EXT: No edema or tenderness    Data Reviewed:   I have personally reviewed following labs and imaging studies:  Labs: Labs show the following:   Basic Metabolic Panel: Recent Labs  Lab 12/10/23 1023 12/11/23 0638 12/13/23 0522 12/14/23 0632 12/15/23 0518 12/16/23 0630  NA 148* 144 142 143 141 142  K 3.1* 3.5 3.6 3.3* 3.4* 3.7  CL 111 110 108 107 106 108  CO2 26 26 24 24 24 24   GLUCOSE 94 114* 81 99 72 82  BUN 27* 20 12 11 13 16   CREATININE 1.15* 1.17* 1.03* 1.05* 1.14* 1.29*  CALCIUM  8.5* 8.7* 8.4* 8.6* 8.2* 8.4*  MG 2.1  --   --   --   --  2.1  PHOS  --  2.3*  --   --   --  2.7   GFR Estimated Creatinine Clearance: 23.4 mL/min (A) (by C-G formula based on SCr of 1.29 mg/dL (H)). Liver Function Tests: Recent Labs  Lab 12/11/23 9361 12/16/23 0630  ALBUMIN 2.2* 1.9*   No results for input(s): LIPASE, AMYLASE in the last 168 hours.  No results for input(s): AMMONIA in the last 168 hours. Coagulation profile Recent Labs  Lab 12/12/23 0504 12/13/23 0522 12/14/23 0632 12/15/23 0518 12/16/23 0630  INR 3.3* 3.5* 3.2* 3.5* 3.0*    CBC: Recent Labs  Lab 12/10/23 1023 12/11/23 9361  12/13/23 0522 12/14/23 0632 12/16/23 0630  WBC 7.8 8.0 8.5 8.3 9.0  HGB 7.2* 9.2* 9.9* 9.1* 8.1*  HCT 23.7* 28.0* 31.6* 28.8* 26.0*  MCV 88.8 86.7 88.0 87.8 88.7  PLT 242 249 248 217 241   Cardiac Enzymes: No results for input(s): CKTOTAL, CKMB, CKMBINDEX, TROPONINI in the last 168 hours. BNP (last 3 results) No results for input(s): PROBNP in the last 8760 hours. CBG: Recent Labs  Lab 12/10/23 1425  GLUCAP 100*   D-Dimer: No results for input(s): DDIMER in the last 72 hours. Hgb A1c: No results for input(s): HGBA1C in the last 72 hours. Lipid Profile: No results for input(s): CHOL, HDL, LDLCALC, TRIG, CHOLHDL, LDLDIRECT in the last 72 hours. Thyroid  function studies: No results for input(s): TSH, T4TOTAL, T3FREE, THYROIDAB in the last 72 hours.  Invalid input(s): FREET3 Anemia work up: No results for input(s): VITAMINB12, FOLATE, FERRITIN, TIBC, IRON, RETICCTPCT in the last 72 hours. Sepsis Labs: Recent Labs  Lab 12/11/23 9361 12/13/23 0522 12/14/23 0632 12/16/23 0630  WBC 8.0 8.5 8.3 9.0    Microbiology Recent Results (from the past 240 hours)  Resp panel by RT-PCR (RSV, Flu A&B, Covid) Anterior Nasal Swab     Status: None   Collection Time: 12/08/23  2:56 PM   Specimen: Anterior Nasal Swab  Result Value Ref Range Status   SARS Coronavirus 2 by RT PCR NEGATIVE NEGATIVE Final    Comment: (NOTE) SARS-CoV-2 target nucleic acids are NOT DETECTED.  The SARS-CoV-2 RNA is generally detectable in upper respiratory specimens during the acute phase of infection. The lowest concentration of SARS-CoV-2 viral copies this assay can detect is 138 copies/mL. A negative result does not preclude SARS-Cov-2 infection and should not be used as the sole basis for treatment or other patient management decisions. A negative result may occur with  improper specimen collection/handling, submission of specimen other than  nasopharyngeal swab, presence of viral mutation(s) within  the areas targeted by this assay, and inadequate number of viral copies(<138 copies/mL). A negative result must be combined with clinical observations, patient history, and epidemiological information. The expected result is Negative.  Fact Sheet for Patients:  BloggerCourse.com  Fact Sheet for Healthcare Providers:  SeriousBroker.it  This test is no t yet approved or cleared by the [place]  FDA and  has been authorized for detection and/or diagnosis of SARS-CoV-2 by FDA under an Emergency Use Authorization (EUA). This EUA will remain  in effect (meaning this test can be used) for the duration of the COVID-19 declaration under Section 564(b)(1) of the Act, 21 U.S.C.section 360bbb-3(b)(1), unless the authorization is terminated  or revoked sooner.       Influenza A by PCR NEGATIVE NEGATIVE Final   Influenza B by PCR NEGATIVE NEGATIVE Final    Comment: (NOTE) The Xpert Xpress SARS-CoV-2/FLU/RSV plus assay is intended as an aid in the diagnosis of influenza from Nasopharyngeal swab specimens and should not be used as a sole basis for treatment. Nasal washings and aspirates are unacceptable for Xpert Xpress SARS-CoV-2/FLU/RSV testing.  Fact Sheet for Patients: BloggerCourse.com  Fact Sheet for Healthcare Providers: SeriousBroker.it  This test is not yet approved or cleared by the [place]  FDA and has been authorized for detection and/or diagnosis of SARS-CoV-2 by FDA under an Emergency Use Authorization (EUA). This EUA will remain in effect (meaning this test can be used) for the duration of the COVID-19 declaration under Section 564(b)(1) of the Act, 21 U.S.C. section 360bbb-3(b)(1), unless the authorization is terminated or revoked.     Resp Syncytial Virus by PCR NEGATIVE NEGATIVE Final    Comment:  (NOTE) Fact Sheet for Patients: BloggerCourse.com  Fact Sheet for Healthcare Providers: SeriousBroker.it  This test is not yet approved or cleared by the [place]  FDA and has been authorized for detection and/or diagnosis of SARS-CoV-2 by FDA under an Emergency Use Authorization (EUA). This EUA will remain in effect (meaning this test can be used) for the duration of the COVID-19 declaration under Section 564(b)(1) of the Act, 21 U.S.C. section 360bbb-3(b)(1), unless the authorization is terminated or revoked.  Performed at [facility], [address] [place], [place] [address]   Urine Culture (for pregnant, neutropenic or urologic patients or patients with an indwelling urinary catheter)     Status: Abnormal   Collection Time: 12/08/23  4:33 PM   Specimen: Urine, Catheterized  Result Value Ref Range Status   Specimen Description   Final    URINE, CATHETERIZED Performed at [facility], [address] [place], [place] [address]    Special Requests   Final    NONE Performed at [facility], [address] [place], [place] [address]    Culture >=100,000 COLONIES/mL ESCHERICHIA COLI (A)  Final   Report Status 12/11/2023 FINAL  Final   Organism ID, Bacteria ESCHERICHIA COLI (A)  Final      Susceptibility   Escherichia coli - MIC*    AMPICILLIN 4 SENSITIVE Sensitive     CEFAZOLIN  (URINE) Value in next row Sensitive      <=1 SENSITIVEThis is a modified FDA-approved test that has been validated and its performance characteristics determined by the reporting laboratory.  This laboratory is certified under the Clinical Laboratory Improvement Amendments CLIA as qualified to perform high complexity clinical laboratory testing.    CEFEPIME Value in next row Sensitive      <=1 SENSITIVEThis is a modified FDA-approved test that has been validated  and its performance characteristics determined by the  reporting laboratory.  This laboratory is certified under the Clinical Laboratory Improvement Amendments CLIA as qualified to perform high complexity clinical laboratory testing.    ERTAPENEM Value in next row Sensitive      <=1 SENSITIVEThis is a modified FDA-approved test that has been validated and its performance characteristics determined by the reporting laboratory.  This laboratory is certified under the Clinical Laboratory Improvement Amendments CLIA as qualified to perform high complexity clinical laboratory testing.    CEFTRIAXONE  Value in next row Sensitive      <=1 SENSITIVEThis is a modified FDA-approved test that has been validated and its performance characteristics determined by the reporting laboratory.  This laboratory is certified under the Clinical Laboratory Improvement Amendments CLIA as qualified to perform high complexity clinical laboratory testing.    CIPROFLOXACIN Value in next row Sensitive      <=1 SENSITIVEThis is a modified FDA-approved test that has been validated and its performance characteristics determined by the reporting laboratory.  This laboratory is certified under the Clinical Laboratory Improvement Amendments CLIA as qualified to perform high complexity clinical laboratory testing.    GENTAMICIN Value in next row Sensitive      <=1 SENSITIVEThis is a modified FDA-approved test that has been validated and its performance characteristics determined by the reporting laboratory.  This laboratory is certified under the Clinical Laboratory Improvement Amendments CLIA as qualified to perform high complexity clinical laboratory testing.    NITROFURANTOIN Value in next row Sensitive      <=1 SENSITIVEThis is a modified FDA-approved test that has been validated and its performance characteristics determined by the reporting laboratory.  This laboratory is certified under the Clinical Laboratory Improvement Amendments CLIA as qualified to perform high complexity clinical  laboratory testing.    TRIMETH/SULFA Value in next row Sensitive      <=1 SENSITIVEThis is a modified FDA-approved test that has been validated and its performance characteristics determined by the reporting laboratory.  This laboratory is certified under the Clinical Laboratory Improvement Amendments CLIA as qualified to perform high complexity clinical laboratory testing.    AMPICILLIN/SULBACTAM Value in next row Sensitive      <=1 SENSITIVEThis is a modified FDA-approved test that has been validated and its performance characteristics determined by the reporting laboratory.  This laboratory is certified under the Clinical Laboratory Improvement Amendments CLIA as qualified to perform high complexity clinical laboratory testing.    PIP/TAZO Value in next row Sensitive      <=4 SENSITIVEThis is a modified FDA-approved test that has been validated and its performance characteristics determined by the reporting laboratory.  This laboratory is certified under the Clinical Laboratory Improvement Amendments CLIA as qualified to perform high complexity clinical laboratory testing.    MEROPENEM Value in next row Sensitive      <=4 SENSITIVEThis is a modified FDA-approved test that has been validated and its performance characteristics determined by the reporting laboratory.  This laboratory is certified under the Clinical Laboratory Improvement Amendments CLIA as qualified to perform high complexity clinical laboratory testing.    * >=100,000 COLONIES/mL ESCHERICHIA COLI  Blood culture (routine x 2)     Status: Abnormal   Collection Time: 12/08/23  5:17 PM   Specimen: BLOOD  Result Value Ref Range Status   Specimen Description   Final    BLOOD BLOOD RIGHT FOREARM Performed at [facility], [address], [place], [place] [address]    Special Requests   Final  BOTTLES DRAWN AEROBIC AND ANAEROBIC Blood Culture results may not be optimal due to an inadequate volume of blood received in culture  bottles Performed at [facility], [address] [place], [place] [address]    Culture  Setup Time   Final    GRAM POSITIVE COCCI ANAEROBIC BOTTLE ONLY Organism ID to follow CRITICAL RESULT CALLED TO, READ BACK BY AND VERIFIED WITHBETHA MAFFUCCI [facility] AT 1042 12/09/23 RAM Performed at [facility], [address] [place], [place] [address]    Culture STAPHYLOCOCCUS AUREUS (A)  Final   Report Status 12/11/2023 FINAL  Final   Organism ID, Bacteria STAPHYLOCOCCUS AUREUS  Final      Susceptibility   Staphylococcus aureus - MIC*    CIPROFLOXACIN <=0.5 SENSITIVE Sensitive     ERYTHROMYCIN <=0.25 SENSITIVE Sensitive     GENTAMICIN <=0.5 SENSITIVE Sensitive     OXACILLIN <=0.25 SENSITIVE Sensitive     TETRACYCLINE <=1 SENSITIVE Sensitive     VANCOMYCIN 1 SENSITIVE Sensitive     TRIMETH/SULFA <=10 SENSITIVE Sensitive     CLINDAMYCIN <=0.25 SENSITIVE Sensitive     RIFAMPIN <=0.5 SENSITIVE Sensitive     Inducible Clindamycin NEGATIVE Sensitive     LINEZOLID 2 SENSITIVE Sensitive     * STAPHYLOCOCCUS AUREUS  Blood Culture ID Panel (Reflexed)     Status: Abnormal   Collection Time: 12/08/23  5:17 PM  Result Value Ref Range Status   Enterococcus faecalis NOT DETECTED NOT DETECTED Final   Enterococcus Faecium NOT DETECTED NOT DETECTED Final   Listeria monocytogenes NOT DETECTED NOT DETECTED Final   Staphylococcus species DETECTED (A) NOT DETECTED Final    Comment: CRITICAL RESULT CALLED TO, READ BACK BY AND VERIFIED WITH: MAFFUCCI CLOSE AT 1042 12/09/23 RAM    Staphylococcus aureus (BCID) DETECTED (A) NOT DETECTED Final    Comment: CRITICAL RESULT CALLED TO, READ BACK BY AND VERIFIED WITH: MAFFUCCI CLOSE AT 1042 12/09/23 RAM    Staphylococcus epidermidis NOT DETECTED NOT DETECTED Final   Staphylococcus lugdunensis NOT DETECTED NOT DETECTED Final   Streptococcus species NOT DETECTED NOT DETECTED Final   Streptococcus agalactiae NOT DETECTED NOT DETECTED Final    Streptococcus pneumoniae NOT DETECTED NOT DETECTED Final   Streptococcus pyogenes NOT DETECTED NOT DETECTED Final   A.calcoaceticus-baumannii NOT DETECTED NOT DETECTED Final   Bacteroides fragilis NOT DETECTED NOT DETECTED Final   Enterobacterales NOT DETECTED NOT DETECTED Final   Enterobacter cloacae complex NOT DETECTED NOT DETECTED Final   Escherichia coli NOT DETECTED NOT DETECTED Final   Klebsiella aerogenes NOT DETECTED NOT DETECTED Final   Klebsiella oxytoca NOT DETECTED NOT DETECTED Final   Klebsiella pneumoniae NOT DETECTED NOT DETECTED Final   Proteus species NOT DETECTED NOT DETECTED Final   Salmonella species NOT DETECTED NOT DETECTED Final   Serratia marcescens NOT DETECTED NOT DETECTED Final   Haemophilus influenzae NOT DETECTED NOT DETECTED Final   Neisseria meningitidis NOT DETECTED NOT DETECTED Final   Pseudomonas aeruginosa NOT DETECTED NOT DETECTED Final   Stenotrophomonas maltophilia NOT DETECTED NOT DETECTED Final   Candida albicans NOT DETECTED NOT DETECTED Final   Candida auris NOT DETECTED NOT DETECTED Final   Candida glabrata NOT DETECTED NOT DETECTED Final   Candida krusei NOT DETECTED NOT DETECTED Final   Candida parapsilosis NOT DETECTED NOT DETECTED Final   Candida tropicalis NOT DETECTED NOT DETECTED Final   Cryptococcus neoformans/gattii NOT DETECTED NOT DETECTED Final   Meth resistant mecA/C and MREJ NOT DETECTED NOT DETECTED Final  Comment: Performed at [facility], [address] [place], [place] [address]  Blood culture (routine x 2)     Status: None   Collection Time: 12/08/23  7:08 PM   Specimen: BLOOD  Result Value Ref Range Status   Specimen Description BLOOD BLOOD LEFT ARM  Final   Special Requests   Final    BOTTLES DRAWN AEROBIC AND ANAEROBIC Blood Culture adequate volume   Culture   Final    NO GROWTH 5 DAYS Performed at [facility], [address] [place], [place] [address]    Report Status 12/13/2023 FINAL   Final  Culture, blood (Routine X 2) w Reflex to ID Panel     Status: None   Collection Time: 12/11/23  6:38 AM   Specimen: BLOOD  Result Value Ref Range Status   Specimen Description BLOOD LEFT ANTECUBITAL  Final   Special Requests   Final    BOTTLES DRAWN AEROBIC AND ANAEROBIC Blood Culture results may not be optimal due to an inadequate volume of blood received in culture bottles   Culture   Final    NO GROWTH 5 DAYS Performed at [facility], [address], [place], [place] [address]    Report Status 12/16/2023 FINAL  Final  Culture, blood (Routine X 2) w Reflex to ID Panel     Status: None   Collection Time: 12/11/23  6:38 AM   Specimen: BLOOD LEFT HAND  Result Value Ref Range Status   Specimen Description BLOOD LEFT HAND  Final   Special Requests   Final    BOTTLES DRAWN AEROBIC AND ANAEROBIC Blood Culture adequate volume   Culture   Final    NO GROWTH 5 DAYS Performed at [facility], [address], [place], [place] [address]    Report Status 12/16/2023 FINAL  Final    Procedures and diagnostic studies:  CT SHOULDER RIGHT WO CONTRAST Result Date: 12/16/2023 CLINICAL DATA:  Chronic right shoulder pain. EXAM: CT OF THE UPPER RIGHT EXTREMITY WITHOUT CONTRAST TECHNIQUE: Multidetector CT imaging of the upper right extremity was performed according to the standard protocol. RADIATION DOSE REDUCTION: This exam was performed according to the departmental dose-optimization program which includes automated exposure control, adjustment of the mA and/or kV according to patient size and/or use of iterative reconstruction technique. COMPARISON:  Radiographs 12/10/2023.  MR arthrogram 10/05/2023 FINDINGS: Bones/Joint/Cartilage No evidence of acute fracture, dislocation or humeral head osteonecrosis. As seen previously, there are advanced glenohumeral degenerative changes with joint space narrowing and large osteophytes of the humeral head and neck. There is moderate-sized  joint effusion with probable small intra-articular loose bodies. Mild acromioclavicular degenerative changes. Ligaments Suboptimally assessed by CT. Muscles and Tendons Mild generalized muscular atrophy. No significant focal muscular atrophy identified. The subscapularis tendon is medially displaced by large anterior osteophytes. Soft tissues Mild generalized soft tissue edema surrounding the shoulder without apparent focal abnormality. No evidence of foreign body or soft tissue emphysema. There is a moderate-sized dependent right pleural effusion with dependent right lung atelectasis. Aortic and great vessel atherosclerosis noted. IMPRESSION: 1. Advanced glenohumeral degenerative changes with joint effusion and probable small intra-articular loose bodies. 2. No acute osseous findings. 3. Moderate-sized dependent right pleural effusion with dependent right lung atelectasis. 4.  Aortic Atherosclerosis (ICD10-I70.0). Electronically Signed   By: Elsie Perone M.D.   On: 12/16/2023 11:03   ECHO TEE Result Date: 12/14/2023    TRANSESOPHOGEAL ECHO REPORT   Patient Name:   KOURTNEE LAHEY Date  of Exam: 12/14/2023 Medical Rec #:  [id number]     Height:       62.0 in Accession #:    [id number]    Weight:       131.6 lb Date of Birth:  1932/08/19     BSA:          1.600 m Patient Age:    89 years      BP:           123/68 mmHg Patient Gender: F             HR:           88 bpm. Exam Location:  [facility] Procedure: Transesophageal Echo and Color Doppler (Both Spectral and Color Flow            Doppler were utilized during procedure). Indications:     Endocarditis  History:         Patient has prior history of Echocardiogram examinations, most                  recent 12/12/2023. Endocarditis.  Sonographer:     Ashley McNeely-Sloane Referring Phys:  [id number] CARALYN HUDSON Diagnosing Phys: Keller Paterson PROCEDURE: After discussion of the risks and benefits of a TEE, an informed consent was obtained from a family member. The  transesophogeal probe was passed without difficulty through the esophogus of the patient. Sedation performed by different physician. The patient was monitored while under deep sedation. Image quality was excellent. The patient's vital signs; including heart rate, blood pressure, and oxygen saturation; remained stable throughout the procedure. The patient developed no complications during the procedure.  IMPRESSIONS  1. Left ventricular ejection fraction, by estimation, is 55 to 60%. The left ventricle has normal function.  2. Right ventricular systolic function is normal. The right ventricular size is normal.  3. Left atrial size was dilated. No left atrial/left atrial appendage thrombus was detected.  4. The mitral valve is normal in structure. Trivial mitral valve regurgitation.  5. The aortic valve is tricuspid. Aortic valve regurgitation is not visualized. Aortic valve sclerosis/calcification is present, without any evidence of aortic stenosis. Conclusion(s)/Recommendation(s): No evidence of vegetation/infective endocarditis on this transesophageael echocardiogram. FINDINGS  Left Ventricle: Left ventricular ejection fraction, by estimation, is 55 to 60%. The left ventricle has normal function. The left ventricular internal cavity size was normal in size. Right Ventricle: The right ventricular size is normal. No increase in right ventricular wall thickness. Right ventricular systolic function is normal. Left Atrium: Left atrial size was dilated. No left atrial/left atrial appendage thrombus was detected. Right Atrium: Right atrial size was normal in size. Pericardium: There is no evidence of pericardial effusion. Mitral Valve: The mitral valve is normal in structure. Trivial mitral valve regurgitation. Tricuspid Valve: The tricuspid valve is normal in structure. Tricuspid valve regurgitation is mild. Aortic Valve: The aortic valve is tricuspid. Aortic valve regurgitation is not visualized. Aortic valve  sclerosis/calcification is present, without any evidence of aortic stenosis. Pulmonic Valve: The pulmonic valve was normal in structure. Pulmonic valve regurgitation is trivial. Aorta: The aortic root is normal in size and structure. IAS/Shunts: No atrial level shunt detected by color flow Doppler. Keller Paterson Electronically signed by Keller Paterson Signature Date/Time: 12/14/2023/5:41:34 PM    Final                LOS: 8 days   Lycia Sachdeva  [facility]   Pager on [email or web address]. If 7PM-7AM, please contact night-coverage at [email or web address]  12/16/2023, 1:15 PM

## 2023-12-16 NOTE — Progress Notes (Signed)
 Patient ID: NEKEYA BRISKI, female   DOB: 01-May-1932, 89 y.o.   MRN: [id number]  Subjective: The patient has no new complaints pertaining to her right shoulder.   Objective: Vital signs in last 24 hours: Temp:  [97.8 F (36.6 C)-98 F (36.7 C)] 98 F (36.7 C) (09/26 1513) Pulse Rate:  [72-79] 72 (09/26 1513) Resp:  [16-18] 16 (09/26 1513) BP: (106-135)/(51-82) 131/61 (09/26 1513) SpO2:  [98 %-100 %] 98 % (09/26 1513)  Intake/Output from previous day: 09/25 0701 - 09/26 0700 In: 364.3 [P.O.:120; I.V.:244.3] Out: 350 [Urine:350] Intake/Output this shift: Total I/O In: 200 [IV Piggyback:200] Out: -   Recent Labs    12/14/23 0632 12/16/23 0630  HGB 9.1* 8.1*   Recent Labs    12/14/23 0632 12/16/23 0630  WBC 8.3 9.0  RBC 3.28* 2.93*  HCT 28.8* 26.0*  PLT 217 241   Recent Labs    12/15/23 0518 12/16/23 0630  NA 141 142  K 3.4* 3.7  CL 106 108  CO2 24 24  BUN 13 16  CREATININE 1.14* 1.29*  GLUCOSE 72 82  CALCIUM  8.2* 8.4*   Recent Labs    12/15/23 0518 12/16/23 0630  INR 3.5* 3.0*    Physical Exam: The patient's orthopedic exam again is limited to the right shoulder and upper extremity.  There is unchanged as compared to yesterday's examination.  X-rays: A CT scan of the right shoulder has been obtained.  By report, the scan demonstrates evidence of advanced degenerative changes of the glenohumeral joint with a moderate-sized joint effusion with probable small intra-articular loose bodies.  There does not appear to be any evidence for bony erosion to suggest underlying osteomyelitis.  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  Plan: The treatment options have been discussed in detail with Dr. Fayette and Dr. Jens.  Given that there is a joint effusion, despite her lack of other physical findings, the next step would be to have her joint aspirated by interventional radiology in order to rule out with optimal certainty the  likelihood of a septic right shoulder joint.  This will be arranged to be done in the near future.   Pradeep Beaubrun J Kirstin Kugler 12/16/2023, 6:13 PM

## 2023-12-16 NOTE — Evaluation (Signed)
 Clinical/Bedside Swallow Evaluation Patient Details  Name: Cassandra Weiss MRN: [id number] Date of Birth: [birth year]  Today's Date: 12/16/2023 Time: SLP Start Time (ACUTE ONLY): 1158 SLP Stop Time (ACUTE ONLY): 1217 SLP Time Calculation (min) (ACUTE ONLY): 19 min  Past Medical History:  Past Medical History:  Diagnosis Date   Arthritis    Atrial fibrillation (HCC)    Hypertension    Urge urinary incontinence    Past Surgical History:  Past Surgical History:  Procedure Laterality Date   TEE WITHOUT CARDIOVERSION N/A 12/14/2023   Procedure: ECHOCARDIOGRAM, TRANSESOPHAGEAL;  Surgeon: Wilburn Keller BROCKS, MD;  Location: [facility] ORS;  Service: Cardiovascular;  Laterality: N/A;   HPI:  Cassandra Weiss is a 89 y.o. female  with medical history significant of atrial fibrillation, hypertension, urge incontinence, chronic indwelling Foley catheter, hyperlipidemia coronary artery disease, history of stroke, iron deficiency anemia who was brought to the ER (12/08/2023) with altered mental status (less responsive) and hypotension. DG Chest 12/11/2023 Left PICC line tip in the SVC. Cardiomegaly with vascular congestion and suspected early interstitial edema.    Assessment / Plan / Recommendation  Clinical Impression  ST services re-consulted for a bedside swallow evaluation. Pt currently NPO. Per pt's nurse, pt has been too lethargic for PO intake. Pt's son was at bedside throughout this evaluation. All questions were answered to this writer's ability. Pt benefited from extensive verbal and physical stimulation to open eyes and answer questions. Pt with continued reduced spech intelligibility d/t whipsered speech.   Pt's son reports that last night she ate chicken and salmon with her husband.   During this evaluation, pt consumed ice chips and thin liquids via straw (per pt request) without overt s/s of aspiration. When consuming applesauce, pt with mild oral holding and benefited from intermittent cues to  go ahead and swallow to which pt would respond with a swallow and say I already did. After consuming 3 sips of water and 3 boluses of applesauce, pt refused further intake.   At this time, recommend initial diet of dysphagia 1 with thin liquids (via straw) and medicine crushed in puree. Given current medical condition, reduced phonation and reduced PO intake, pt is at a high risk of aspiration as well as malnutrition and dehydration.   ST services will follow for potential diet upgrade as pt tolerates.       SLP Visit Diagnosis: Dysphagia, unspecified (R13.10)    Aspiration Risk  Mild aspiration risk    Diet Recommendation Dysphagia 1 (Puree);Thin liquid    Liquid Administration via: Straw Medication Administration: Crushed with puree Supervision: Full supervision/cueing for compensatory strategies;Staff to assist with self feeding Compensations: Minimize environmental distractions;Slow rate;Small sips/bites Postural Changes: Seated upright at 90 degrees;Remain upright for at least 30 minutes after po intake    Other  Recommendations Oral Care Recommendations: Oral care BID        Functional Status Assessment Patient has had a recent decline in their functional status and/or demonstrates limited ability to make significant improvements in function in a reasonable and predictable amount of time  Frequency and Duration min 2x/week  2 weeks       Prognosis Prognosis for improved oropharyngeal function: Guarded Barriers to Reach Goals: Cognitive deficits;Time post onset;Severity of deficits      Swallow Study   General Date of Onset: 12/15/23 (pt known to ST services from evaluation during initiat portion of admission) HPI: Cassandra Weiss is a 89 y.o. female  with medical history significant of atrial fibrillation,  hypertension, urge incontinence, chronic indwelling Foley catheter, hyperlipidemia coronary artery disease, history of stroke, iron deficiency anemia who was brought  to the ER (12/08/2023) with altered mental status (less responsive) and hypotension. DG Chest 12/11/2023 Left PICC line tip in the SVC. Cardiomegaly with vascular congestion and suspected early interstitial edema. Type of Study: Bedside Swallow Evaluation Previous Swallow Assessment: 12/10/2023 Diet Prior to this Study: NPO Temperature Spikes Noted: No Respiratory Status: Room air History of Recent Intubation: No Behavior/Cognition: Requires cueing (pt observed with decreaed speech intelligibility d/t speaking at reduced vocal intensity (pt with whispered responses)) Oral Cavity Assessment: Within Functional Limits Oral Care Completed by SLP: Recent completion by staff Oral Cavity - Dentition: Missing dentition Self-Feeding Abilities: Total assist Patient Positioning: Upright in bed Baseline Vocal Quality: Low vocal intensity (pt speaking in a whisper) Volitional Cough: Cognitively unable to elicit Volitional Swallow: Unable to elicit    Oral/Motor/Sensory Function Overall Oral Motor/Sensory Function:  (none observed with intake via spoon or straw)   Ice Chips Ice chips: Within functional limits Presentation: Spoon   Thin Liquid Thin Liquid: Within functional limits Presentation: Straw    Nectar Thick Nectar Thick Liquid: Not tested   Honey Thick Honey Thick Liquid: Not tested   Puree Puree: Impaired Presentation: Spoon Oral Phase Impairments: Poor awareness of bolus Oral Phase Functional Implications: Oral holding;Prolonged oral transit   Solid     Solid: Not tested Other Comments: not tested d/t oral holding with puree bolus     Doyt Castellana B. Rubbie, M.S., CCC-SLP, Tree surgeon Certified Brain Injury Specialist [facility]  [facility] Office [phone] Ascom [phone] Fax [phone]

## 2023-12-16 NOTE — Plan of Care (Signed)
  Problem: Fluid Volume: Goal: Hemodynamic stability will improve Outcome: Progressing   Problem: Clinical Measurements: Goal: Diagnostic test results will improve Outcome: Progressing Goal: Signs and symptoms of infection will decrease Outcome: Progressing   Problem: Respiratory: Goal: Ability to maintain adequate ventilation will improve Outcome: Progressing   Problem: Education: Goal: Knowledge of General Education information will improve Description: Including pain rating scale, medication(s)/side effects and non-pharmacologic comfort measures Outcome: Progressing   Problem: Health Behavior/Discharge Planning: Goal: Ability to manage health-related needs will improve Outcome: Progressing   Problem: Clinical Measurements: Goal: Ability to maintain clinical measurements within normal limits will improve Outcome: Progressing Goal: Will remain free from infection Outcome: Progressing Goal: Diagnostic test results will improve Outcome: Progressing Goal: Respiratory complications will improve Outcome: Progressing Goal: Cardiovascular complication will be avoided Outcome: Progressing   Problem: Activity: Goal: Risk for activity intolerance will decrease Outcome: Progressing   Problem: Nutrition: Goal: Adequate nutrition will be maintained Outcome: Progressing   Problem: Coping: Goal: Level of anxiety will decrease Outcome: Progressing   Problem: Elimination: Goal: Will not experience complications related to bowel motility Outcome: Progressing Goal: Will not experience complications related to urinary retention Outcome: Progressing   Problem: Pain Managment: Goal: General experience of comfort will improve and/or be controlled Outcome: Progressing   Problem: Safety: Goal: Ability to remain free from injury will improve Outcome: Progressing   Problem: Skin Integrity: Goal: Risk for impaired skin integrity will decrease Outcome: Progressing   Problem:  Safety: Goal: Non-violent Restraint(s) Outcome: Progressing

## 2023-12-16 NOTE — Progress Notes (Signed)
 Date of Admission:  12/08/2023      ID: Cassandra Weiss is a 89 y.o. female Principal Problem:   Sepsis secondary to UTI [facility]) Active Problems:   Altered mental status   AKI (acute kidney injury)   AMS (altered mental status)   Acute urinary tract infection   Benign essential HTN   Coronary artery disease   Hyperlipidemia   Paroxysmal A-fib (HCC)   Iron deficiency anemia   Hypernatremia   MSSA bacteremia   Acute metabolic encephalopathy   Hypercoagulable state   Urge urinary incontinence   Hypokalemia  TEE negative  Subjective: Pt sleeping  Medications:   amLODipine   5 mg Oral Daily   vitamin C   500 mg Oral BID   atorvastatin   80 mg Oral Daily   carvedilol   25 mg Oral BID   Chlorhexidine  Gluconate Cloth  6 each Topical Daily   feeding supplement  237 mL Oral TID BM   gabapentin   300 mg Oral TID   hydrALAZINE   50 mg Oral TID   lidocaine   1 patch Transdermal Q24H   multivitamin with minerals  1 tablet Oral Daily   sodium chloride  flush  10-40 mL Intracatheter Q12H   Warfarin - Pharmacist Dosing Inpatient   Does not apply q1600   zinc  sulfate (50mg  elemental zinc )  220 mg Oral Daily    Objective: Vital signs in last 24 hours: Patient Vitals for the past 24 hrs:  BP Temp Temp src Pulse Resp SpO2  12/16/23 0746 135/67 97.8 F (36.6 C) Axillary 78 16 99 %  12/16/23 0352 106/82 -- -- 79 18 98 %  12/16/23 0032 (!) 121/57 -- -- 74 17 100 %  12/15/23 1959 (!) 112/51 98 F (36.7 C) -- 75 16 100 %  12/15/23 1535 (!) 100/50 98.4 F (36.9 C) Oral 71 16 96 %     PHYSICAL EXAM:  General: sleeping Lungs: b/l air entry Heart: Regular rate and rhythm, no murmur, rub or gallop. Abdomen: Soft, non-tender,not distended. Bowel sounds normal. No masses On moving her rt arm she resist Skin: left heel eschar  Neurologic: not examined  Lab Results    Latest Ref Rng & Units 12/16/2023    6:30 AM 12/14/2023    6:32 AM 12/13/2023    5:22 AM  CBC  WBC 4.0 - 10.5 K/uL 9.0  8.3   8.5   Hemoglobin 12.0 - 15.0 g/dL 8.1  9.1  9.9   Hematocrit 36.0 - 46.0 % 26.0  28.8  31.6   Platelets 150 - 400 K/uL 241  217  248        Latest Ref Rng & Units 12/16/2023    6:30 AM 12/15/2023    5:18 AM 12/14/2023    6:32 AM  CMP  Glucose 70 - 99 mg/dL 82  72  99   BUN 8 - 23 mg/dL 16  13  11    Creatinine 0.44 - 1.00 mg/dL 8.70  8.85  8.94   Sodium 135 - 145 mmol/L 142  141  143   Potassium 3.5 - 5.1 mmol/L 3.7  3.4  3.3   Chloride 98 - 111 mmol/L 108  106  107   CO2 22 - 32 mmol/L 24  24  24    Calcium  8.9 - 10.3 mg/dL 8.4  8.2  8.6       Microbiology: 9/18 1 set  MSSA UC Ecoli Studies/Results: CT SHOULDER RIGHT WO CONTRAST Result Date: 12/16/2023 CLINICAL DATA:  Chronic right  shoulder pain. EXAM: CT OF THE UPPER RIGHT EXTREMITY WITHOUT CONTRAST TECHNIQUE: Multidetector CT imaging of the upper right extremity was performed according to the standard protocol. RADIATION DOSE REDUCTION: This exam was performed according to the departmental dose-optimization program which includes automated exposure control, adjustment of the mA and/or kV according to patient size and/or use of iterative reconstruction technique. COMPARISON:  Radiographs 12/10/2023.  MR arthrogram 10/05/2023 FINDINGS: Bones/Joint/Cartilage No evidence of acute fracture, dislocation or humeral head osteonecrosis. As seen previously, there are advanced glenohumeral degenerative changes with joint space narrowing and large osteophytes of the humeral head and neck. There is moderate-sized joint effusion with probable small intra-articular loose bodies. Mild acromioclavicular degenerative changes. Ligaments Suboptimally assessed by CT. Muscles and Tendons Mild generalized muscular atrophy. No significant focal muscular atrophy identified. The subscapularis tendon is medially displaced by large anterior osteophytes. Soft tissues Mild generalized soft tissue edema surrounding the shoulder without apparent focal abnormality. No  evidence of foreign body or soft tissue emphysema. There is a moderate-sized dependent right pleural effusion with dependent right lung atelectasis. Aortic and great vessel atherosclerosis noted. IMPRESSION: 1. Advanced glenohumeral degenerative changes with joint effusion and probable small intra-articular loose bodies. 2. No acute osseous findings. 3. Moderate-sized dependent right pleural effusion with dependent right lung atelectasis. 4.  Aortic Atherosclerosis (ICD10-I70.0). Electronically Signed   By: Elsie Perone M.D.   On: 12/16/2023 11:03   ECHO TEE Result Date: 12/14/2023    TRANSESOPHOGEAL ECHO REPORT   Patient Name:   Cassandra Weiss Date of Exam: 12/14/2023 Medical Rec #:  [id number]     Height:       62.0 in Accession #:    [id number]    Weight:       131.6 lb Date of Birth:  May 30, 1932     BSA:          1.600 m Patient Age:    89 years      BP:           123/68 mmHg Patient Gender: F             HR:           88 bpm. Exam Location:  [facility] Procedure: Transesophageal Echo and Color Doppler (Both Spectral and Color Flow            Doppler were utilized during procedure). Indications:     Endocarditis  History:         Patient has prior history of Echocardiogram examinations, most                  recent 12/12/2023. Endocarditis.  Sonographer:     Ashley McNeely-Sloane Referring Phys:  [id number] CARALYN HUDSON Diagnosing Phys: Keller Paterson PROCEDURE: After discussion of the risks and benefits of a TEE, an informed consent was obtained from a family member. The transesophogeal probe was passed without difficulty through the esophogus of the patient. Sedation performed by different physician. The patient was monitored while under deep sedation. Image quality was excellent. The patient's vital signs; including heart rate, blood pressure, and oxygen saturation; remained stable throughout the procedure. The patient developed no complications during the procedure.  IMPRESSIONS  1. Left ventricular ejection  fraction, by estimation, is 55 to 60%. The left ventricle has normal function.  2. Right ventricular systolic function is normal. The right ventricular size is normal.  3. Left atrial size was dilated. No left atrial/left atrial appendage thrombus was detected.  4. The mitral valve is normal  in structure. Trivial mitral valve regurgitation.  5. The aortic valve is tricuspid. Aortic valve regurgitation is not visualized. Aortic valve sclerosis/calcification is present, without any evidence of aortic stenosis. Conclusion(s)/Recommendation(s): No evidence of vegetation/infective endocarditis on this transesophageael echocardiogram. FINDINGS  Left Ventricle: Left ventricular ejection fraction, by estimation, is 55 to 60%. The left ventricle has normal function. The left ventricular internal cavity size was normal in size. Right Ventricle: The right ventricular size is normal. No increase in right ventricular wall thickness. Right ventricular systolic function is normal. Left Atrium: Left atrial size was dilated. No left atrial/left atrial appendage thrombus was detected. Right Atrium: Right atrial size was normal in size. Pericardium: There is no evidence of pericardial effusion. Mitral Valve: The mitral valve is normal in structure. Trivial mitral valve regurgitation. Tricuspid Valve: The tricuspid valve is normal in structure. Tricuspid valve regurgitation is mild. Aortic Valve: The aortic valve is tricuspid. Aortic valve regurgitation is not visualized. Aortic valve sclerosis/calcification is present, without any evidence of aortic stenosis. Pulmonic Valve: The pulmonic valve was normal in structure. Pulmonic valve regurgitation is trivial. Aorta: The aortic root is normal in size and structure. IAS/Shunts: No atrial level shunt detected by color flow Doppler. Keller Alluri Electronically signed by Keller Paterson Signature Date/Time: 12/14/2023/5:41:34 PM    Final       Assessment/Plan: Acute metabolic  encephalopathy due to dehydration, hypernatremia and infection- improved but not back to baseline ? Hypotension- resolved  sepsis /dehydration      MSSA bacteremia 1/4- s low bioburden she has some chronic skin wounds- left heel eschar- dry  And superficial sacral decub  Severe degenerative rt shoulder arthritis Xray shoulder repeated on 9/20 which shows worsening degeneration- discuss with ortho to see whether aspiration is  needed to r/o septic arthritis  Though clinically less likely IR tried to aspirate today but did not get much fluid Culture sent- I added cell count but not sure they have enough specimen to do it  On cefazolin  TEEno vegetation  if no septic arthritis then she will just get 1 dose of dalbavancin IV 1500mg  on discharge If septic arthritis present then will do cefadroxil PO adjusted to crcl for 4 weeks     Complicated UTI due to foley foley changed in ED Ecoli being treated with Iv cefazolin     Hypernatremia- secondary to dehydration/poor intake- resolved   AKI - improving   Anemia ( r/o bleed)    On coumadin  high INR Received Vitamin K  improved    Discussed with ID pharmacist, hospitalist, orthopedic surgeon and IR ID will follow her remotely this weekend

## 2023-12-17 ENCOUNTER — Inpatient Hospital Stay

## 2023-12-17 DIAGNOSIS — A419 Sepsis, unspecified organism: Secondary | ICD-10-CM | POA: Diagnosis not present

## 2023-12-17 DIAGNOSIS — N390 Urinary tract infection, site not specified: Secondary | ICD-10-CM | POA: Diagnosis not present

## 2023-12-17 DIAGNOSIS — M009 Pyogenic arthritis, unspecified: Secondary | ICD-10-CM

## 2023-12-17 LAB — CBC
HCT: 31.4 % — ABNORMAL LOW (ref 36.0–46.0)
Hemoglobin: 10 g/dL — ABNORMAL LOW (ref 12.0–15.0)
MCH: 27.9 pg (ref 26.0–34.0)
MCHC: 31.8 g/dL (ref 30.0–36.0)
MCV: 87.5 fL (ref 80.0–100.0)
Platelets: 267 K/uL (ref 150–400)
RBC: 3.59 MIL/uL — ABNORMAL LOW (ref 3.87–5.11)
RDW: 22.1 % — ABNORMAL HIGH (ref 11.5–15.5)
WBC: 8.3 K/uL (ref 4.0–10.5)
nRBC: 0 % (ref 0.0–0.2)

## 2023-12-17 LAB — RENAL FUNCTION PANEL
Albumin: 1.9 g/dL — ABNORMAL LOW (ref 3.5–5.0)
Anion gap: 12 (ref 5–15)
BUN: 16 mg/dL (ref 8–23)
CO2: 20 mmol/L — ABNORMAL LOW (ref 22–32)
Calcium: 8.4 mg/dL — ABNORMAL LOW (ref 8.9–10.3)
Chloride: 110 mmol/L (ref 98–111)
Creatinine, Ser: 1.16 mg/dL — ABNORMAL HIGH (ref 0.44–1.00)
GFR, Estimated: 45 mL/min — ABNORMAL LOW (ref >60)
Glucose, Bld: 75 mg/dL (ref 70–99)
Phosphorus: 2.5 mg/dL (ref 2.5–4.6)
Potassium: 3.9 mmol/L (ref 3.5–5.1)
Sodium: 142 mmol/L (ref 135–145)

## 2023-12-17 LAB — SYNOVIAL FLUID, CRYSTAL: Crystals, Fluid: NONE SEEN

## 2023-12-17 LAB — PROTIME-INR
INR: 3 — ABNORMAL HIGH (ref 0.8–1.2)
Prothrombin Time: 32.3 s — ABNORMAL HIGH (ref 11.4–15.2)

## 2023-12-17 MED ORDER — OXYCODONE HCL 5 MG PO TABS
5.0000 mg | ORAL_TABLET | Freq: Three times a day (TID) | ORAL | Status: DC | PRN
Start: 2023-12-17 — End: 2023-12-20

## 2023-12-17 NOTE — Progress Notes (Signed)
 Date of Admission:  12/08/2023      ID: Cassandra Weiss is a 89 y.o. female Principal Problem:   Sepsis secondary to UTI [facility]) Active Problems:   Altered mental status   AKI (acute kidney injury)   AMS (altered mental status)   Acute urinary tract infection   Benign essential HTN   Coronary artery disease   Hyperlipidemia   Paroxysmal A-fib (HCC)   Iron deficiency anemia   Hypernatremia   MSSA bacteremia   Acute metabolic encephalopathy   Hypercoagulable state   Urge urinary incontinence   Hypokalemia    Subjective: Says she is okay  Medications:   amLODipine   5 mg Oral Daily   vitamin C   500 mg Oral BID   atorvastatin   80 mg Oral Daily   carvedilol   25 mg Oral BID   Chlorhexidine  Gluconate Cloth  6 each Topical Daily   feeding supplement  237 mL Oral TID BM   gabapentin   300 mg Oral TID   hydrALAZINE   50 mg Oral TID   lidocaine   1 patch Transdermal Q24H   multivitamin with minerals  1 tablet Oral Daily   sodium chloride  flush  10-40 mL Intracatheter Q12H   Warfarin - Pharmacist Dosing Inpatient   Does not apply q1600   zinc  sulfate (50mg  elemental zinc )  220 mg Oral Daily    Objective: Vital signs in last 24 hours: Patient Vitals for the past 24 hrs:  BP Temp Temp src Pulse Resp SpO2  12/17/23 0754 (!) 145/63 97.6 F (36.4 C) Oral 77 16 99 %  12/17/23 0321 (!) 135/52 97.7 F (36.5 C) Oral 79 18 99 %  12/16/23 2018 97/81 98 F (36.7 C) Oral 80 20 98 %     PHYSICAL EXAM:  General: awake and alert Restricted movt rt shoulder Swelling of rt arm Lungs: b/l air entry Heart: Regular rate and rhythm, no murmur, rub or gallop. Abdomen: Soft, non-tender,not distended. Bowel sounds normal. No masses Skin: left heel eschar  Neurologic: not examined  Lab Results    Latest Ref Rng & Units 12/17/2023   10:21 AM 12/16/2023    6:30 AM 12/14/2023    6:32 AM  CBC  WBC 4.0 - 10.5 K/uL 8.3  9.0  8.3   Hemoglobin 12.0 - 15.0 g/dL 89.9  8.1  9.1   Hematocrit 36.0 -  46.0 % 31.4  26.0  28.8   Platelets 150 - 400 K/uL 267  241  217        Latest Ref Rng & Units 12/17/2023    9:00 AM 12/16/2023    6:30 AM 12/15/2023    5:18 AM  CMP  Glucose 70 - 99 mg/dL 75  82  72   BUN 8 - 23 mg/dL 16  16  13    Creatinine 0.44 - 1.00 mg/dL 8.83  8.70  8.85   Sodium 135 - 145 mmol/L 142  142  141   Potassium 3.5 - 5.1 mmol/L 3.9  3.7  3.4   Chloride 98 - 111 mmol/L 110  108  106   CO2 22 - 32 mmol/L 20  24  24    Calcium  8.9 - 10.3 mg/dL 8.4  8.4  8.2       Microbiology: 9/18 1 set  MSSA Repeat [facility] 9/21 NG UC Ecoli  Rt shoulder joint aspiration not enough fluid Cell count could not be done Culture pending  Studies/Results: IR US  DRAIN/INJ MAJOR JOINT/BURSA Result Date: 12/16/2023 INDICATION: 89 year old  with possible right shoulder joint infection EXAM: IR DRAIN/INJ MAJOR JOINT/BURSA MEDICATIONS: None ANESTHESIA/SEDATION: None COMPLICATIONS: None immediate. PROCEDURE: Informed written consent was obtained from the patient after a thorough discussion of the procedural risks, benefits and alternatives. All questions were addressed. Maximal Sterile Barrier Technique was utilized including caps, mask, sterile gowns, sterile gloves, sterile drape, hand hygiene and skin antiseptic. A timeout was performed prior to the initiation of the procedure. Ultrasound was used to the right shoulder. There is a trace minimally complex fluid collection within the joint capsule. Local anesthesia was attained by infiltration with 1% lidocaine . Under real-time ultrasound guidance, an 18 gauge needle was advanced into the fluid collection. Aspiration yields only a drop of synovial fluid. Therefore, 1 cc of sterile normal saline was lavaged into the joint capsule and then 3 aspirated yielding 1 cc of minimally bloody fluid. This will be sent for Gram stain and culture. IMPRESSION: Sonographically, there is minimal right shoulder joint effusion. Certainly less than seen on the prior MRI from  this past July. Successful aspiration of a very small amount of minimally bloody synovial fluid. No gross purulence. Electronically Signed   By: Wilkie Lent M.D.   On: 12/16/2023 16:35      Assessment/Plan: Acute metabolic encephalopathy due to dehydration, hypernatremia and infection- improved but not back to baseline ? Hypotension- resolved  sepsis /dehydration      MSSA bacteremia 1/4- s low bioburden she has some chronic skin wounds- left heel eschar- dry  And superficial sacral decub On cefazolin  day 9   Severe degenerative rt shoulder arthritis Xray shoulder repeated on 9/20 which shows worsening degeneration- discuss with ortho to see whether aspiration is  needed to r/o septic arthritis  Though clinically less likely IR tried to aspirate on 12/16/23  but did not get much fluid Culture sent-cell count could not be done as not enough fluid On cefazolin  TEEno vegetation  if no septic arthritis she will complete 2 weeks of IV cefazolin  (  or if discharged earlier can get a dose of dalba 1500mg  on Monday If septic arthritis present then will do cefadroxil PO adjusted to crcl for 4 weeks     Complicated UTI due to foley foley changed in ED Ecoli being treated with Iv cefazolin     Hypernatremia- secondary to dehydration/poor intake- resolved   AKI - improving   Anemia ( r/o bleed)    On coumadin  high INR Received Vitamin K  improved    Discussed with care team

## 2023-12-17 NOTE — Progress Notes (Incomplete Revision)
 Progress Note    Cassandra Weiss  FMW:[id number] DOB: [birth year]  DOA: 12/08/2023 PCP: Sadie Manna, MD      Brief Narrative:    Medical records reviewed and are as summarized below:  Cassandra Weiss is a 89 y.o. female  with medical history significant of atrial fibrillation, hypertension, urge incontinence, chronic indwelling Foley catheter, hyperlipidemia coronary artery disease, history of stroke, iron deficiency anemia who was brought to the ER with altered mental status (less responsive) and hypotension.   Vital signs in the ED: Temperature 96.3 F and 95.6 F (per rectum), respiratory rate 24, pulse 85, BP 116/64 and dropped to 93/42, O2 sat 98% on room air.  Diagnostic data significant for sodium 156, BUN 50, creatinine 1.71, albumin 2.7, WBC 8.5, hemoglobin 9.3, platelet count 334, INR 8.8, urinalysis positive for UTI.  Chest x-ray and CT head did not show any acute abnormality.  CT renal stone: IMPRESSION: 1. Trace to small left and trace right pleural effusions. 2. Cardiomegaly. 3. Small hiatal hernia. 4. Nonobstructive 4 mm left nephrolithiasis. 5. Marked L5-S1 disc bulge. 6. Otherwise limited evaluation on this noncontrast study. 7.  Aortic Atherosclerosis (ICD10-I70.0).    She was admitted to the hospital for hypernatremia, Coumadin  coagulopathy with supratherapeutic INR, sepsis secondary to acute UTI.       Assessment/Plan:   Principal Problem:   Sepsis secondary to UTI [facility]) Active Problems:   Acute urinary tract infection   MSSA bacteremia   AKI (acute kidney injury)   Hypernatremia   Acute metabolic encephalopathy   Benign essential HTN   Paroxysmal A-fib (HCC)   Hypercoagulable state   Coronary artery disease   Iron deficiency anemia   Hyperlipidemia   Urge urinary incontinence   Altered mental status   AMS (altered mental status)   Hypokalemia   Nutrition Problem: Increased nutrient needs Etiology: wound  healing  Signs/Symptoms: estimated needs   Body mass index is 24.07 kg/m.   Sepsis secondary to acute UTI, MSSA bacteremia: Urine culture showed pansensitive E. coli.  Blood culture showed MSSA.  No growth on repeat blood cultures from 12/11/2022. 2D echo showed preserved EF. S/p TEE on 12/14/2023 did not show any vegetations. She was evaluated by Dr. Edie, orthopedic surgeon. CT right shoulder showed severe glenohumeral degenerative changes with joint effusion and probable small intra-articular loose bodies. S/p aspiration of right shoulder joint on 12/16/2023.  Synovial fluid culture is pending. Continue IV cefazolin .   AKI on CKD stage IIIa: Improved Hypernatremia: Improved Hypokalemia: Improved   Vomiting: Antiemetics as needed. She is having bowel movements.   Acute metabolic encephalopathy: Improved but suspect patient has baseline cognitive impairment.   Paroxysmal atrial fibrillation, history of stroke: Warfarin on hold Warfarin coagulopathy: Improved.  INR down to 3.0.  Continue to monitor INR and adjust Coumadin  accordingly.   INR went up as high as 10.1.   Black stools: Improved.  Stool charted on 12/16/2023 was brown in color  H&H stable.  Hemoglobin up from 8.1-10.0   Comorbidities include CAD, severe asymmetric LVH of septal segment on 2D echo, severe hypoalbuminemia, right pleural effusion, hyperlipidemia, urge incontinence, chronic indwelling Foley catheter (replaced on day of admission, 12/08/2023)   General Weakness: PT recommended discharge to SNF.  Awaiting placement.  Pain in lower extremities: Son requested something stronger for pain.  He is okay with oxycodone .  He understands that this can make patient drowsy or confused.  He was previously concerned that patient had been sleeping most  of the time and wanted her to be more alert to able to eat and participate in therapy.     Diet Order             DIET - DYS 1 Fluid consistency: Thin  Diet  effective now                                  Consultants: Cardiologist ID specialist Orthopedic surgery Interventional radiologist  Procedures: TEE on 12/14/2023 S/p aspiration of right shoulder joint on 12/16/2023.    Medications:    amLODipine   5 mg Oral Daily   vitamin C   500 mg Oral BID   atorvastatin   80 mg Oral Daily   carvedilol   25 mg Oral BID   Chlorhexidine  Gluconate Cloth  6 each Topical Daily   feeding supplement  237 mL Oral TID BM   gabapentin   300 mg Oral TID   hydrALAZINE   50 mg Oral TID   lidocaine   1 patch Transdermal Q24H   multivitamin with minerals  1 tablet Oral Daily   sodium chloride  flush  10-40 mL Intracatheter Q12H   Warfarin - Pharmacist Dosing Inpatient   Does not apply q1600   zinc  sulfate (50mg  elemental zinc )  220 mg Oral Daily   Continuous Infusions:   ceFAZolin  (ANCEF ) IV 2 g (12/17/23 1000)     Anti-infectives (From admission, onward)    Start     Dose/Rate Route Frequency Ordered Stop   12/15/23 2200  ceFAZolin  (ANCEF ) IVPB 2g/100 mL premix        2 g 200 mL/hr over 30 Minutes Intravenous Every 12 hours 12/15/23 0845     12/09/23 2200  levofloxacin  (LEVAQUIN ) IVPB 500 mg  Status:  Discontinued        500 mg 100 mL/hr over 60 Minutes Intravenous Every 24 hours 12/09/23 2123 12/11/23 1329   12/09/23 1800  cefTRIAXone  (ROCEPHIN ) 2 g in sodium chloride  0.9 % 100 mL IVPB  Status:  Discontinued        2 g 200 mL/hr over 30 Minutes Intravenous Every 24 hours 12/08/23 1907 12/09/23 1626   12/09/23 1800  ceFAZolin  (ANCEF ) IVPB 2g/100 mL premix  Status:  Discontinued        2 g 200 mL/hr over 30 Minutes Intravenous Every 8 hours 12/09/23 1626 12/15/23 0845   12/08/23 1745  cefTRIAXone  (ROCEPHIN ) 2 g in sodium chloride  0.9 % 100 mL IVPB        2 g 200 mL/hr over 30 Minutes Intravenous Once 12/08/23 1730 12/08/23 1821              Family Communication/Anticipated D/C date and plan/Code Status   DVT  prophylaxis:      Code Status: Do not attempt resuscitation (DNR) PRE-ARREST INTERVENTIONS DESIRED  Family Communication: None  Disposition Plan: Plan to discharge to SNF   Status is: Inpatient Remains inpatient appropriate because: MSSA bacteremia       Subjective:   Interval events noted.  No complaints.  She is more alert today.  Objective:    Vitals:   12/16/23 1513 12/16/23 2018 12/17/23 0321 12/17/23 0754  BP: 131/61 97/81 (!) 135/52 (!) 145/63  Pulse: 72 80 79 77  Resp: 16 20 18 16   Temp: 98 F (36.7 C) 98 F (36.7 C) 97.7 F (36.5 C) 97.6 F (36.4 C)  TempSrc:  Oral Oral Oral  SpO2: 98% 98% 99% 99%  Weight:       No data found.   Intake/Output Summary (Last 24 hours) at 12/17/2023 1541 Last data filed at 12/17/2023 1247 Gross per 24 hour  Intake 200 ml  Output 440 ml  Net -240 ml   Filed Weights   12/11/23 1101  Weight: 59.7 kg    Exam:  GEN: NAD SKIN: Warm and dry EYES: No pallor or icterus ENT: MMM CV: RRR PULM: CTA B ABD: soft, ND, NT, +BS CNS: AAO x 1 (person) EXT: No edema or tenderness     Data Reviewed:   I have personally reviewed following labs and imaging studies:  Labs: Labs show the following:   Basic Metabolic Panel: Recent Labs  Lab 12/11/23 0638 12/13/23 0522 12/14/23 0632 12/15/23 0518 12/16/23 0630 12/17/23 0900  NA 144 142 143 141 142 142  K 3.5 3.6 3.3* 3.4* 3.7 3.9  CL 110 108 107 106 108 110  CO2 26 24 24 24 24  20*  GLUCOSE 114* 81 99 72 82 75  BUN 20 12 11 13 16 16   CREATININE 1.17* 1.03* 1.05* 1.14* 1.29* 1.16*  CALCIUM  8.7* 8.4* 8.6* 8.2* 8.4* 8.4*  MG  --   --   --   --  2.1  --   PHOS 2.3*  --   --   --  2.7 2.5   GFR Estimated Creatinine Clearance: 26 mL/min (A) (by C-G formula based on SCr of 1.16 mg/dL (H)). Liver Function Tests: Recent Labs  Lab 12/11/23 9361 12/16/23 0630 12/17/23 0900  ALBUMIN 2.2* 1.9* 1.9*   No results for input(s): LIPASE, AMYLASE in the last 168  hours.  No results for input(s): AMMONIA in the last 168 hours. Coagulation profile Recent Labs  Lab 12/13/23 0522 12/14/23 0632 12/15/23 0518 12/16/23 0630 12/17/23 0514  INR 3.5* 3.2* 3.5* 3.0* 3.0*    CBC: Recent Labs  Lab 12/11/23 0638 12/13/23 0522 12/14/23 0632 12/16/23 0630 12/17/23 1021  WBC 8.0 8.5 8.3 9.0 8.3  HGB 9.2* 9.9* 9.1* 8.1* 10.0*  HCT 28.0* 31.6* 28.8* 26.0* 31.4*  MCV 86.7 88.0 87.8 88.7 87.5  PLT 249 248 217 241 267   Cardiac Enzymes: No results for input(s): CKTOTAL, CKMB, CKMBINDEX, TROPONINI in the last 168 hours. BNP (last 3 results) No results for input(s): PROBNP in the last 8760 hours. CBG: No results for input(s): GLUCAP in the last 168 hours.  D-Dimer: No results for input(s): DDIMER in the last 72 hours. Hgb A1c: No results for input(s): HGBA1C in the last 72 hours. Lipid Profile: No results for input(s): CHOL, HDL, LDLCALC, TRIG, CHOLHDL, LDLDIRECT in the last 72 hours. Thyroid  function studies: No results for input(s): TSH, T4TOTAL, T3FREE, THYROIDAB in the last 72 hours.  Invalid input(s): FREET3 Anemia work up: No results for input(s): VITAMINB12, FOLATE, FERRITIN, TIBC, IRON, RETICCTPCT in the last 72 hours. Sepsis Labs: Recent Labs  Lab 12/13/23 0522 12/14/23 0632 12/16/23 0630 12/17/23 1021  WBC 8.5 8.3 9.0 8.3    Microbiology Recent Results (from the past 240 hours)  Resp panel by RT-PCR (RSV, Flu A&B, Covid) Anterior Nasal Swab     Status: None   Collection Time: 12/08/23  2:56 PM   Specimen: Anterior Nasal Swab  Result Value Ref Range Status   SARS Coronavirus 2 by RT PCR NEGATIVE NEGATIVE Final    Comment: (NOTE) SARS-CoV-2 target nucleic acids are NOT DETECTED.  The SARS-CoV-2 RNA is generally detectable in upper respiratory specimens during the acute phase of infection.  The lowest concentration of SARS-CoV-2 viral copies this assay can detect is 138  copies/mL. A negative result does not preclude SARS-Cov-2 infection and should not be used as the sole basis for treatment or other patient management decisions. A negative result may occur with  improper specimen collection/handling, submission of specimen other than nasopharyngeal swab, presence of viral mutation(s) within the areas targeted by this assay, and inadequate number of viral copies(<138 copies/mL). A negative result must be combined with clinical observations, patient history, and epidemiological information. The expected result is Negative.  Fact Sheet for Patients:  BloggerCourse.com  Fact Sheet for Healthcare Providers:  SeriousBroker.it  This test is no t yet approved or cleared by the [place]  FDA and  has been authorized for detection and/or diagnosis of SARS-CoV-2 by FDA under an Emergency Use Authorization (EUA). This EUA will remain  in effect (meaning this test can be used) for the duration of the COVID-19 declaration under Section 564(b)(1) of the Act, 21 U.S.C.section 360bbb-3(b)(1), unless the authorization is terminated  or revoked sooner.       Influenza A by PCR NEGATIVE NEGATIVE Final   Influenza B by PCR NEGATIVE NEGATIVE Final    Comment: (NOTE) The Xpert Xpress SARS-CoV-2/FLU/RSV plus assay is intended as an aid in the diagnosis of influenza from Nasopharyngeal swab specimens and should not be used as a sole basis for treatment. Nasal washings and aspirates are unacceptable for Xpert Xpress SARS-CoV-2/FLU/RSV testing.  Fact Sheet for Patients: BloggerCourse.com  Fact Sheet for Healthcare Providers: SeriousBroker.it  This test is not yet approved or cleared by the [place]  FDA and has been authorized for detection and/or diagnosis of SARS-CoV-2 by FDA under an Emergency Use Authorization (EUA). This EUA will remain in effect (meaning  this test can be used) for the duration of the COVID-19 declaration under Section 564(b)(1) of the Act, 21 U.S.C. section 360bbb-3(b)(1), unless the authorization is terminated or revoked.     Resp Syncytial Virus by PCR NEGATIVE NEGATIVE Final    Comment: (NOTE) Fact Sheet for Patients: BloggerCourse.com  Fact Sheet for Healthcare Providers: SeriousBroker.it  This test is not yet approved or cleared by the [place]  FDA and has been authorized for detection and/or diagnosis of SARS-CoV-2 by FDA under an Emergency Use Authorization (EUA). This EUA will remain in effect (meaning this test can be used) for the duration of the COVID-19 declaration under Section 564(b)(1) of the Act, 21 U.S.C. section 360bbb-3(b)(1), unless the authorization is terminated or revoked.  Performed at [facility], [address], [place], [place] [address]   Urine Culture (for pregnant, neutropenic or urologic patients or patients with an indwelling urinary catheter)     Status: Abnormal   Collection Time: 12/08/23  4:33 PM   Specimen: Urine, Catheterized  Result Value Ref Range Status   Specimen Description   Final    URINE, CATHETERIZED Performed at [facility], [address], [place], [place] [address]    Special Requests   Final    NONE Performed at [facility], [address] [place], [place] [address]    Culture >=100,000 COLONIES/mL ESCHERICHIA COLI (A)  Final   Report Status 12/11/2023 FINAL  Final   Organism ID, Bacteria ESCHERICHIA COLI (A)  Final      Susceptibility   Escherichia coli - MIC*    AMPICILLIN 4 SENSITIVE Sensitive     CEFAZOLIN  (URINE) Value in next row Sensitive      <=1 SENSITIVEThis is a modified  FDA-approved test that has been validated and its performance characteristics determined by the reporting laboratory.  This laboratory is certified under the Clinical Laboratory Improvement  Amendments CLIA as qualified to perform high complexity clinical laboratory testing.    CEFEPIME Value in next row Sensitive      <=1 SENSITIVEThis is a modified FDA-approved test that has been validated and its performance characteristics determined by the reporting laboratory.  This laboratory is certified under the Clinical Laboratory Improvement Amendments CLIA as qualified to perform high complexity clinical laboratory testing.    ERTAPENEM Value in next row Sensitive      <=1 SENSITIVEThis is a modified FDA-approved test that has been validated and its performance characteristics determined by the reporting laboratory.  This laboratory is certified under the Clinical Laboratory Improvement Amendments CLIA as qualified to perform high complexity clinical laboratory testing.    CEFTRIAXONE  Value in next row Sensitive      <=1 SENSITIVEThis is a modified FDA-approved test that has been validated and its performance characteristics determined by the reporting laboratory.  This laboratory is certified under the Clinical Laboratory Improvement Amendments CLIA as qualified to perform high complexity clinical laboratory testing.    CIPROFLOXACIN Value in next row Sensitive      <=1 SENSITIVEThis is a modified FDA-approved test that has been validated and its performance characteristics determined by the reporting laboratory.  This laboratory is certified under the Clinical Laboratory Improvement Amendments CLIA as qualified to perform high complexity clinical laboratory testing.    GENTAMICIN Value in next row Sensitive      <=1 SENSITIVEThis is a modified FDA-approved test that has been validated and its performance characteristics determined by the reporting laboratory.  This laboratory is certified under the Clinical Laboratory Improvement Amendments CLIA as qualified to perform high complexity clinical laboratory testing.    NITROFURANTOIN Value in next row Sensitive      <=1 SENSITIVEThis is a  modified FDA-approved test that has been validated and its performance characteristics determined by the reporting laboratory.  This laboratory is certified under the Clinical Laboratory Improvement Amendments CLIA as qualified to perform high complexity clinical laboratory testing.    TRIMETH/SULFA Value in next row Sensitive      <=1 SENSITIVEThis is a modified FDA-approved test that has been validated and its performance characteristics determined by the reporting laboratory.  This laboratory is certified under the Clinical Laboratory Improvement Amendments CLIA as qualified to perform high complexity clinical laboratory testing.    AMPICILLIN/SULBACTAM Value in next row Sensitive      <=1 SENSITIVEThis is a modified FDA-approved test that has been validated and its performance characteristics determined by the reporting laboratory.  This laboratory is certified under the Clinical Laboratory Improvement Amendments CLIA as qualified to perform high complexity clinical laboratory testing.    PIP/TAZO Value in next row Sensitive      <=4 SENSITIVEThis is a modified FDA-approved test that has been validated and its performance characteristics determined by the reporting laboratory.  This laboratory is certified under the Clinical Laboratory Improvement Amendments CLIA as qualified to perform high complexity clinical laboratory testing.    MEROPENEM Value in next row Sensitive      <=4 SENSITIVEThis is a modified FDA-approved test that has been validated and its performance characteristics determined by the reporting laboratory.  This laboratory is certified under the Clinical Laboratory Improvement Amendments CLIA as qualified to perform high complexity clinical laboratory testing.    * >=100,000 COLONIES/mL ESCHERICHIA COLI  Blood culture (routine  x 2)     Status: Abnormal   Collection Time: 12/08/23  5:17 PM   Specimen: BLOOD  Result Value Ref Range Status   Specimen Description   Final    BLOOD  BLOOD RIGHT FOREARM Performed at [facility], [address] [place], [place] [address]    Special Requests   Final    BOTTLES DRAWN AEROBIC AND ANAEROBIC Blood Culture results may not be optimal due to an inadequate volume of blood received in culture bottles Performed at [facility], [address] [place], [place] [address]    Culture  Setup Time   Final    GRAM POSITIVE COCCI ANAEROBIC BOTTLE ONLY Organism ID to follow CRITICAL RESULT CALLED TO, READ BACK BY AND VERIFIED WITHBETHA MAFFUCCI [facility] AT 1042 12/09/23 RAM Performed at [facility], [address], [place], [place] [address]    Culture STAPHYLOCOCCUS AUREUS (A)  Final   Report Status 12/11/2023 FINAL  Final   Organism ID, Bacteria STAPHYLOCOCCUS AUREUS  Final      Susceptibility   Staphylococcus aureus - MIC*    CIPROFLOXACIN <=0.5 SENSITIVE Sensitive     ERYTHROMYCIN <=0.25 SENSITIVE Sensitive     GENTAMICIN <=0.5 SENSITIVE Sensitive     OXACILLIN <=0.25 SENSITIVE Sensitive     TETRACYCLINE <=1 SENSITIVE Sensitive     VANCOMYCIN 1 SENSITIVE Sensitive     TRIMETH/SULFA <=10 SENSITIVE Sensitive     CLINDAMYCIN <=0.25 SENSITIVE Sensitive     RIFAMPIN <=0.5 SENSITIVE Sensitive     Inducible Clindamycin NEGATIVE Sensitive     LINEZOLID 2 SENSITIVE Sensitive     * STAPHYLOCOCCUS AUREUS  Blood Culture ID Panel (Reflexed)     Status: Abnormal   Collection Time: 12/08/23  5:17 PM  Result Value Ref Range Status   Enterococcus faecalis NOT DETECTED NOT DETECTED Final   Enterococcus Faecium NOT DETECTED NOT DETECTED Final   Listeria monocytogenes NOT DETECTED NOT DETECTED Final   Staphylococcus species DETECTED (A) NOT DETECTED Final    Comment: CRITICAL RESULT CALLED TO, READ BACK BY AND VERIFIED WITH: MAFFUCCI CLOSE AT 1042 12/09/23 RAM    Staphylococcus aureus (BCID) DETECTED (A) NOT DETECTED Final    Comment: CRITICAL RESULT CALLED TO, READ BACK BY AND VERIFIED WITH: MAFFUCCI CLOSE AT  1042 12/09/23 RAM    Staphylococcus epidermidis NOT DETECTED NOT DETECTED Final   Staphylococcus lugdunensis NOT DETECTED NOT DETECTED Final   Streptococcus species NOT DETECTED NOT DETECTED Final   Streptococcus agalactiae NOT DETECTED NOT DETECTED Final   Streptococcus pneumoniae NOT DETECTED NOT DETECTED Final   Streptococcus pyogenes NOT DETECTED NOT DETECTED Final   A.calcoaceticus-baumannii NOT DETECTED NOT DETECTED Final   Bacteroides fragilis NOT DETECTED NOT DETECTED Final   Enterobacterales NOT DETECTED NOT DETECTED Final   Enterobacter cloacae complex NOT DETECTED NOT DETECTED Final   Escherichia coli NOT DETECTED NOT DETECTED Final   Klebsiella aerogenes NOT DETECTED NOT DETECTED Final   Klebsiella oxytoca NOT DETECTED NOT DETECTED Final   Klebsiella pneumoniae NOT DETECTED NOT DETECTED Final   Proteus species NOT DETECTED NOT DETECTED Final   Salmonella species NOT DETECTED NOT DETECTED Final   Serratia marcescens NOT DETECTED NOT DETECTED Final   Haemophilus influenzae NOT DETECTED NOT DETECTED Final   Neisseria meningitidis NOT DETECTED NOT DETECTED Final   Pseudomonas aeruginosa NOT DETECTED NOT DETECTED Final   Stenotrophomonas maltophilia NOT DETECTED NOT DETECTED Final   Candida albicans NOT DETECTED NOT DETECTED Final   Candida auris NOT DETECTED  NOT DETECTED Final   Candida glabrata NOT DETECTED NOT DETECTED Final   Candida krusei NOT DETECTED NOT DETECTED Final   Candida parapsilosis NOT DETECTED NOT DETECTED Final   Candida tropicalis NOT DETECTED NOT DETECTED Final   Cryptococcus neoformans/gattii NOT DETECTED NOT DETECTED Final   Meth resistant mecA/C and MREJ NOT DETECTED NOT DETECTED Final    Comment: Performed at [facility], [address] [place], [place] [address]  Blood culture (routine x 2)     Status: None   Collection Time: 12/08/23  7:08 PM   Specimen: BLOOD  Result Value Ref Range Status   Specimen Description BLOOD BLOOD LEFT ARM   Final   Special Requests   Final    BOTTLES DRAWN AEROBIC AND ANAEROBIC Blood Culture adequate volume   Culture   Final    NO GROWTH 5 DAYS Performed at [facility], [address] [place], [place] [address]    Report Status 12/13/2023 FINAL  Final  Culture, blood (Routine X 2) w Reflex to ID Panel     Status: None   Collection Time: 12/11/23  6:38 AM   Specimen: BLOOD  Result Value Ref Range Status   Specimen Description BLOOD LEFT ANTECUBITAL  Final   Special Requests   Final    BOTTLES DRAWN AEROBIC AND ANAEROBIC Blood Culture results may not be optimal due to an inadequate volume of blood received in culture bottles   Culture   Final    NO GROWTH 5 DAYS Performed at [facility], [address] [place], [place] [address]    Report Status 12/16/2023 FINAL  Final  Culture, blood (Routine X 2) w Reflex to ID Panel     Status: None   Collection Time: 12/11/23  6:38 AM   Specimen: BLOOD LEFT HAND  Result Value Ref Range Status   Specimen Description BLOOD LEFT HAND  Final   Special Requests   Final    BOTTLES DRAWN AEROBIC AND ANAEROBIC Blood Culture adequate volume   Culture   Final    NO GROWTH 5 DAYS Performed at [name], [address], [place], [place] [address]    Report Status 12/16/2023 FINAL  Final  Aerobic/Anaerobic Culture w Gram Stain (surgical/deep wound)     Status: None (Preliminary result)   Collection Time: 12/16/23  4:36 PM   Specimen: Wound; Synovial Fluid  Result Value Ref Range Status   Specimen Description   Final    WOUND Performed at [facility], [address], [place], [place] [address]    Special Requests   Final    NONE Performed at [facility], [address] [place], [place] [address]    Gram Stain NO WBC SEEN NO ORGANISMS SEEN   Final   Culture   Final    NO GROWTH < 12 HOURS Performed at [facility], [address], [place], [place] [address]    Report Status PENDING   Incomplete    Procedures and diagnostic studies:  IR US  DRAIN/INJ MAJOR JOINT/BURSA Result Date: 12/16/2023 INDICATION: 89 year old with possible right shoulder joint infection EXAM: IR DRAIN/INJ MAJOR JOINT/BURSA MEDICATIONS: None ANESTHESIA/SEDATION: None COMPLICATIONS: None immediate. PROCEDURE: Informed written consent was obtained from the patient after a thorough discussion of the procedural risks, benefits and alternatives. All questions were addressed. Maximal Sterile Barrier Technique was utilized including caps, mask, sterile gowns, sterile gloves, sterile drape, hand hygiene and skin antiseptic. A timeout was performed prior to the  initiation of the procedure. Ultrasound was used to the right shoulder. There is a trace minimally complex fluid collection within the joint capsule. Local anesthesia was attained by infiltration with 1% lidocaine . Under real-time ultrasound guidance, an 18 gauge needle was advanced into the fluid collection. Aspiration yields only a drop of synovial fluid. Therefore, 1 cc of sterile normal saline was lavaged into the joint capsule and then 3 aspirated yielding 1 cc of minimally bloody fluid. This will be sent for Gram stain and culture. IMPRESSION: Sonographically, there is minimal right shoulder joint effusion. Certainly less than seen on the prior MRI from this past July. Successful aspiration of a very small amount of minimally bloody synovial fluid. No gross purulence. Electronically Signed   By: Wilkie Lent M.D.   On: 12/16/2023 16:35               LOS: 9 days   Ivy Meriwether  [facility]   Pager on [email or web address]. If 7PM-7AM, please contact night-coverage at [email or web address]     12/17/2023, 3:41 PM

## 2023-12-17 NOTE — Consult Note (Signed)
 PHARMACY - ANTICOAGULATION CONSULT NOTE  Pharmacy Consult for warfarin  Indication: Afib   Allergies  Allergen Reactions   Penicillins Rash    Has patient had a PCN reaction causing immediate rash, facial/tongue/throat swelling, SOB or lightheadedness with hypotension: Unknown Has patient had a PCN reaction causing severe rash involving mucus membranes or skin necrosis: Unknown Has patient had a PCN reaction that required hospitalization: Unknown Has patient had a PCN reaction occurring within the last 10 years: Unknown If all of the above answers are NO, then may proceed with Cephalosporin use.     Patient Measurements: Weight: 59.7 kg (131 lb 9.8 oz)  Vital Signs: Temp: 97.6 F (36.4 C) (09/27 0754) Temp Source: Oral (09/27 0754) BP: 145/63 (09/27 0754) Pulse Rate: 77 (09/27 0754)  Labs: Recent Labs    12/15/23 0518 12/16/23 0630 12/17/23 0514  HGB  --  8.1*  --   HCT  --  26.0*  --   PLT  --  241  --   LABPROT 37.0* 32.5* 32.3*  INR 3.5* 3.0* 3.0*  CREATININE 1.14* 1.29*  --     Estimated Creatinine Clearance: 23.4 mL/min (A) (by C-G formula based on SCr of 1.29 mg/dL (H)).   Medical History: Past Medical History:  Diagnosis Date   Arthritis    Atrial fibrillation (HCC)    Hypertension    Urge urinary incontinence     Medications:  Reported home regimen is Warfarin 1 mg po daily with last dose 12/08/23  Assessment: 89 yo female with a PMH of A.fib, HTN, and CAD presented to ED due to altered mental status. In ED patient found to have supratherapeutic INR.  Pharmacy consulted to manage warfarin dosing.  DDI: Ancef  (increase INR) Diet: NPO, last Ensure given on 9/23  Goal of Therapy:  INR 2-3 Monitor platelets by anticoagulation protocol: Yes   Date INR Warfarin Dose  9/18 8.8 1 mg (PTA)  9/19 10.1 Hold. IV vitamin K  given.   9/20 1.7 1 mg  9/21 2.8 Hold  9/22 3.3 Hold  9/23 3.5 Hold  9/24 3.2 Hold  9/25 3.5 Hold  9/26 3.0 Hold  9/27 3.0  Hold             Plan:  INR 3.0 again INR trend potentially due to poor diet, (albumin 1.9 on 9/26), and acute illness. Patient placed on NPO status on 9/23-9/24 for TEE. Now Dysphagia 1 diet Given INR at highest end of goal and previous INR up trend after 1 mg given on 9/20, will continue to HOLD warfarin dose today Daily INR while inpatient and CBC at least every 72 hours  Suzann Allean LABOR, PharmD Clinical Pharmacist 12/17/2023 8:28 AM

## 2023-12-17 NOTE — Progress Notes (Signed)
 Progress Note    Cassandra Weiss  FMW:[id number] DOB: 05/03/1932  DOA: 12/08/2023 PCP: Sadie Manna, MD      Brief Narrative:    Medical records reviewed and are as summarized below:  Cassandra Weiss is a 89 y.o. female  with medical history significant of atrial fibrillation, hypertension, urge incontinence, chronic indwelling Foley catheter, hyperlipidemia coronary artery disease, history of stroke, iron deficiency anemia who was brought to the ER with altered mental status (less responsive) and hypotension.   Vital signs in the ED: Temperature 96.3 F and 95.6 F (per rectum), respiratory rate 24, pulse 85, BP 116/64 and dropped to 93/42, O2 sat 98% on room air.  Diagnostic data significant for sodium 156, BUN 50, creatinine 1.71, albumin 2.7, WBC 8.5, hemoglobin 9.3, platelet count 334, INR 8.8, urinalysis positive for UTI.  Chest x-ray and CT head did not show any acute abnormality.  CT renal stone: IMPRESSION: 1. Trace to small left and trace right pleural effusions. 2. Cardiomegaly. 3. Small hiatal hernia. 4. Nonobstructive 4 mm left nephrolithiasis. 5. Marked L5-S1 disc bulge. 6. Otherwise limited evaluation on this noncontrast study. 7.  Aortic Atherosclerosis (ICD10-I70.0).    She was admitted to the hospital for hypernatremia, Coumadin  coagulopathy with supratherapeutic INR, sepsis secondary to acute UTI.       Assessment/Plan:   Principal Problem:   Sepsis secondary to UTI [facility]) Active Problems:   Acute urinary tract infection   MSSA bacteremia   AKI (acute kidney injury)   Hypernatremia   Acute metabolic encephalopathy   Benign essential HTN   Paroxysmal A-fib (HCC)   Hypercoagulable state   Coronary artery disease   Iron deficiency anemia   Hyperlipidemia   Urge urinary incontinence   Altered mental status   AMS (altered mental status)   Hypokalemia   Nutrition Problem: Increased nutrient needs Etiology: wound  healing  Signs/Symptoms: estimated needs   Body mass index is 24.07 kg/m.   Sepsis secondary to acute UTI, MSSA bacteremia: Urine culture showed pansensitive E. coli.  Blood culture showed MSSA.  No growth on repeat blood cultures from 12/11/2022. 2D echo showed preserved EF. S/p TEE on 12/14/2023 did not show any vegetations. She was evaluated by Dr. Edie, orthopedic surgeon. CT right shoulder showed severe glenohumeral degenerative changes with joint effusion and probable small intra-articular loose bodies. S/p aspiration of right shoulder joint on 12/16/2023.  Synovial fluid culture is pending. Continue IV cefazolin .   AKI on CKD stage IIIa: Improved Hypernatremia: Improved Hypokalemia: Improved   Vomiting: Antiemetics as needed. She is having bowel movements.   Acute metabolic encephalopathy: Improved but suspect patient has baseline cognitive impairment.   Paroxysmal atrial fibrillation, history of stroke: Warfarin on hold Warfarin coagulopathy: Improved.  INR down to 3.0.  Continue to monitor INR and adjust Coumadin  accordingly.   INR went up as high as 10.1.   Black stools: Improved.  Stool charted on 12/16/2023 was brown in color  H&H stable.  Hemoglobin up from 8.1-10.0   Comorbidities include CAD, severe asymmetric LVH of septal segment on 2D echo, severe hypoalbuminemia, right pleural effusion, hyperlipidemia, urge incontinence, chronic indwelling Foley catheter (replaced on day of admission, 12/08/2023)   General Weakness: PT recommended discharge to SNF.  Awaiting placement.  Pain in lower extremities: Son requested something stronger for pain.  He is okay with oxycodone .  He understands that this can make patient drowsy or confused.  He was previously concerned that patient had been sleeping most  of the time and wanted her to be more alert to able to eat and participate in therapy.     Diet Order             DIET - DYS 1 Fluid consistency: Thin  Diet  effective now                                  Consultants: Cardiologist ID specialist Orthopedic surgery Interventional radiologist  Procedures: TEE on 12/14/2023 S/p aspiration of right shoulder joint on 12/16/2023.    Medications:    amLODipine   5 mg Oral Daily   vitamin C   500 mg Oral BID   atorvastatin   80 mg Oral Daily   carvedilol   25 mg Oral BID   Chlorhexidine  Gluconate Cloth  6 each Topical Daily   feeding supplement  237 mL Oral TID BM   gabapentin   300 mg Oral TID   hydrALAZINE   50 mg Oral TID   lidocaine   1 patch Transdermal Q24H   multivitamin with minerals  1 tablet Oral Daily   sodium chloride  flush  10-40 mL Intracatheter Q12H   Warfarin - Pharmacist Dosing Inpatient   Does not apply q1600   zinc  sulfate (50mg  elemental zinc )  220 mg Oral Daily   Continuous Infusions:   ceFAZolin  (ANCEF ) IV 2 g (12/17/23 1000)     Anti-infectives (From admission, onward)    Start     Dose/Rate Route Frequency Ordered Stop   12/15/23 2200  ceFAZolin  (ANCEF ) IVPB 2g/100 mL premix        2 g 200 mL/hr over 30 Minutes Intravenous Every 12 hours 12/15/23 0845     12/09/23 2200  levofloxacin  (LEVAQUIN ) IVPB 500 mg  Status:  Discontinued        500 mg 100 mL/hr over 60 Minutes Intravenous Every 24 hours 12/09/23 2123 12/11/23 1329   12/09/23 1800  cefTRIAXone  (ROCEPHIN ) 2 g in sodium chloride  0.9 % 100 mL IVPB  Status:  Discontinued        2 g 200 mL/hr over 30 Minutes Intravenous Every 24 hours 12/08/23 1907 12/09/23 1626   12/09/23 1800  ceFAZolin  (ANCEF ) IVPB 2g/100 mL premix  Status:  Discontinued        2 g 200 mL/hr over 30 Minutes Intravenous Every 8 hours 12/09/23 1626 12/15/23 0845   12/08/23 1745  cefTRIAXone  (ROCEPHIN ) 2 g in sodium chloride  0.9 % 100 mL IVPB        2 g 200 mL/hr over 30 Minutes Intravenous Once 12/08/23 1730 12/08/23 1821              Family Communication/Anticipated D/C date and plan/Code Status   DVT  prophylaxis:      Code Status: Do not attempt resuscitation (DNR) PRE-ARREST INTERVENTIONS DESIRED  Family Communication: Plan discussed with son over the phone  Disposition Plan: Plan to discharge to SNF   Status is: Inpatient Remains inpatient appropriate because: MSSA bacteremia       Subjective:   Interval events noted.  No complaints.  She is more alert today.  Objective:    Vitals:   12/16/23 1513 12/16/23 2018 12/17/23 0321 12/17/23 0754  BP: 131/61 97/81 (!) 135/52 (!) 145/63  Pulse: 72 80 79 77  Resp: 16 20 18 16   Temp: 98 F (36.7 C) 98 F (36.7 C) 97.7 F (36.5 C) 97.6 F (36.4 C)  TempSrc:  Oral Oral Oral  SpO2: 98% 98% 99% 99%  Weight:       No data found.   Intake/Output Summary (Last 24 hours) at 12/17/2023 1541 Last data filed at 12/17/2023 1247 Gross per 24 hour  Intake 200 ml  Output 440 ml  Net -240 ml   Filed Weights   12/11/23 1101  Weight: 59.7 kg    Exam:  GEN: NAD SKIN: Warm and dry EYES: No pallor or icterus ENT: MMM CV: RRR PULM: CTA B ABD: soft, ND, NT, +BS CNS: AAO x 1 (person) EXT: No edema or tenderness     Data Reviewed:   I have personally reviewed following labs and imaging studies:  Labs: Labs show the following:   Basic Metabolic Panel: Recent Labs  Lab 12/11/23 0638 12/13/23 0522 12/14/23 0632 12/15/23 0518 12/16/23 0630 12/17/23 0900  NA 144 142 143 141 142 142  K 3.5 3.6 3.3* 3.4* 3.7 3.9  CL 110 108 107 106 108 110  CO2 26 24 24 24 24  20*  GLUCOSE 114* 81 99 72 82 75  BUN 20 12 11 13 16 16   CREATININE 1.17* 1.03* 1.05* 1.14* 1.29* 1.16*  CALCIUM  8.7* 8.4* 8.6* 8.2* 8.4* 8.4*  MG  --   --   --   --  2.1  --   PHOS 2.3*  --   --   --  2.7 2.5   GFR Estimated Creatinine Clearance: 26 mL/min (A) (by C-G formula based on SCr of 1.16 mg/dL (H)). Liver Function Tests: Recent Labs  Lab 12/11/23 9361 12/16/23 0630 12/17/23 0900  ALBUMIN 2.2* 1.9* 1.9*   No results for input(s):  LIPASE, AMYLASE in the last 168 hours.  No results for input(s): AMMONIA in the last 168 hours. Coagulation profile Recent Labs  Lab 12/13/23 0522 12/14/23 0632 12/15/23 0518 12/16/23 0630 12/17/23 0514  INR 3.5* 3.2* 3.5* 3.0* 3.0*    CBC: Recent Labs  Lab 12/11/23 0638 12/13/23 0522 12/14/23 0632 12/16/23 0630 12/17/23 1021  WBC 8.0 8.5 8.3 9.0 8.3  HGB 9.2* 9.9* 9.1* 8.1* 10.0*  HCT 28.0* 31.6* 28.8* 26.0* 31.4*  MCV 86.7 88.0 87.8 88.7 87.5  PLT 249 248 217 241 267   Cardiac Enzymes: No results for input(s): CKTOTAL, CKMB, CKMBINDEX, TROPONINI in the last 168 hours. BNP (last 3 results) No results for input(s): PROBNP in the last 8760 hours. CBG: No results for input(s): GLUCAP in the last 168 hours.  D-Dimer: No results for input(s): DDIMER in the last 72 hours. Hgb A1c: No results for input(s): HGBA1C in the last 72 hours. Lipid Profile: No results for input(s): CHOL, HDL, LDLCALC, TRIG, CHOLHDL, LDLDIRECT in the last 72 hours. Thyroid  function studies: No results for input(s): TSH, T4TOTAL, T3FREE, THYROIDAB in the last 72 hours.  Invalid input(s): FREET3 Anemia work up: No results for input(s): VITAMINB12, FOLATE, FERRITIN, TIBC, IRON, RETICCTPCT in the last 72 hours. Sepsis Labs: Recent Labs  Lab 12/13/23 0522 12/14/23 0632 12/16/23 0630 12/17/23 1021  WBC 8.5 8.3 9.0 8.3    Microbiology Recent Results (from the past 240 hours)  Resp panel by RT-PCR (RSV, Flu A&B, Covid) Anterior Nasal Swab     Status: None   Collection Time: 12/08/23  2:56 PM   Specimen: Anterior Nasal Swab  Result Value Ref Range Status   SARS Coronavirus 2 by RT PCR NEGATIVE NEGATIVE Final    Comment: (NOTE) SARS-CoV-2 target nucleic acids are NOT DETECTED.  The SARS-CoV-2 RNA is generally detectable in upper respiratory specimens  during the acute phase of infection. The lowest concentration of SARS-CoV-2 viral  copies this assay can detect is 138 copies/mL. A negative result does not preclude SARS-Cov-2 infection and should not be used as the sole basis for treatment or other patient management decisions. A negative result may occur with  improper specimen collection/handling, submission of specimen other than nasopharyngeal swab, presence of viral mutation(s) within the areas targeted by this assay, and inadequate number of viral copies(<138 copies/mL). A negative result must be combined with clinical observations, patient history, and epidemiological information. The expected result is Negative.  Fact Sheet for Patients:  BloggerCourse.com  Fact Sheet for Healthcare Providers:  SeriousBroker.it  This test is no t yet approved or cleared by the [place]  FDA and  has been authorized for detection and/or diagnosis of SARS-CoV-2 by FDA under an Emergency Use Authorization (EUA). This EUA will remain  in effect (meaning this test can be used) for the duration of the COVID-19 declaration under Section 564(b)(1) of the Act, 21 U.S.C.section 360bbb-3(b)(1), unless the authorization is terminated  or revoked sooner.       Influenza A by PCR NEGATIVE NEGATIVE Final   Influenza B by PCR NEGATIVE NEGATIVE Final    Comment: (NOTE) The Xpert Xpress SARS-CoV-2/FLU/RSV plus assay is intended as an aid in the diagnosis of influenza from Nasopharyngeal swab specimens and should not be used as a sole basis for treatment. Nasal washings and aspirates are unacceptable for Xpert Xpress SARS-CoV-2/FLU/RSV testing.  Fact Sheet for Patients: BloggerCourse.com  Fact Sheet for Healthcare Providers: SeriousBroker.it  This test is not yet approved or cleared by the [place]  FDA and has been authorized for detection and/or diagnosis of SARS-CoV-2 by FDA under an Emergency Use Authorization (EUA). This  EUA will remain in effect (meaning this test can be used) for the duration of the COVID-19 declaration under Section 564(b)(1) of the Act, 21 U.S.C. section 360bbb-3(b)(1), unless the authorization is terminated or revoked.     Resp Syncytial Virus by PCR NEGATIVE NEGATIVE Final    Comment: (NOTE) Fact Sheet for Patients: BloggerCourse.com  Fact Sheet for Healthcare Providers: SeriousBroker.it  This test is not yet approved or cleared by the [place]  FDA and has been authorized for detection and/or diagnosis of SARS-CoV-2 by FDA under an Emergency Use Authorization (EUA). This EUA will remain in effect (meaning this test can be used) for the duration of the COVID-19 declaration under Section 564(b)(1) of the Act, 21 U.S.C. section 360bbb-3(b)(1), unless the authorization is terminated or revoked.  Performed at [facility], [address] [place], [place] [address]   Urine Culture (for pregnant, neutropenic or urologic patients or patients with an indwelling urinary catheter)     Status: Abnormal   Collection Time: 12/08/23  4:33 PM   Specimen: Urine, Catheterized  Result Value Ref Range Status   Specimen Description   Final    URINE, CATHETERIZED Performed at [facility], [address], [place], [place] [address]    Special Requests   Final    NONE Performed at [facility], [address] [place], [place] [address]    Culture >=100,000 COLONIES/mL ESCHERICHIA COLI (A)  Final   Report Status 12/11/2023 FINAL  Final   Organism ID, Bacteria ESCHERICHIA COLI (A)  Final      Susceptibility   Escherichia coli - MIC*    AMPICILLIN 4 SENSITIVE Sensitive     CEFAZOLIN  (URINE) Value in next row Sensitive      <=  1 SENSITIVEThis is a modified FDA-approved test that has been validated and its performance characteristics determined by the reporting laboratory.  This laboratory is certified under  the Clinical Laboratory Improvement Amendments CLIA as qualified to perform high complexity clinical laboratory testing.    CEFEPIME Value in next row Sensitive      <=1 SENSITIVEThis is a modified FDA-approved test that has been validated and its performance characteristics determined by the reporting laboratory.  This laboratory is certified under the Clinical Laboratory Improvement Amendments CLIA as qualified to perform high complexity clinical laboratory testing.    ERTAPENEM Value in next row Sensitive      <=1 SENSITIVEThis is a modified FDA-approved test that has been validated and its performance characteristics determined by the reporting laboratory.  This laboratory is certified under the Clinical Laboratory Improvement Amendments CLIA as qualified to perform high complexity clinical laboratory testing.    CEFTRIAXONE  Value in next row Sensitive      <=1 SENSITIVEThis is a modified FDA-approved test that has been validated and its performance characteristics determined by the reporting laboratory.  This laboratory is certified under the Clinical Laboratory Improvement Amendments CLIA as qualified to perform high complexity clinical laboratory testing.    CIPROFLOXACIN Value in next row Sensitive      <=1 SENSITIVEThis is a modified FDA-approved test that has been validated and its performance characteristics determined by the reporting laboratory.  This laboratory is certified under the Clinical Laboratory Improvement Amendments CLIA as qualified to perform high complexity clinical laboratory testing.    GENTAMICIN Value in next row Sensitive      <=1 SENSITIVEThis is a modified FDA-approved test that has been validated and its performance characteristics determined by the reporting laboratory.  This laboratory is certified under the Clinical Laboratory Improvement Amendments CLIA as qualified to perform high complexity clinical laboratory testing.    NITROFURANTOIN Value in next row Sensitive       <=1 SENSITIVEThis is a modified FDA-approved test that has been validated and its performance characteristics determined by the reporting laboratory.  This laboratory is certified under the Clinical Laboratory Improvement Amendments CLIA as qualified to perform high complexity clinical laboratory testing.    TRIMETH/SULFA Value in next row Sensitive      <=1 SENSITIVEThis is a modified FDA-approved test that has been validated and its performance characteristics determined by the reporting laboratory.  This laboratory is certified under the Clinical Laboratory Improvement Amendments CLIA as qualified to perform high complexity clinical laboratory testing.    AMPICILLIN/SULBACTAM Value in next row Sensitive      <=1 SENSITIVEThis is a modified FDA-approved test that has been validated and its performance characteristics determined by the reporting laboratory.  This laboratory is certified under the Clinical Laboratory Improvement Amendments CLIA as qualified to perform high complexity clinical laboratory testing.    PIP/TAZO Value in next row Sensitive      <=4 SENSITIVEThis is a modified FDA-approved test that has been validated and its performance characteristics determined by the reporting laboratory.  This laboratory is certified under the Clinical Laboratory Improvement Amendments CLIA as qualified to perform high complexity clinical laboratory testing.    MEROPENEM Value in next row Sensitive      <=4 SENSITIVEThis is a modified FDA-approved test that has been validated and its performance characteristics determined by the reporting laboratory.  This laboratory is certified under the Clinical Laboratory Improvement Amendments CLIA as qualified to perform high complexity clinical laboratory testing.    * >=100,000 COLONIES/mL ESCHERICHIA  COLI  Blood culture (routine x 2)     Status: Abnormal   Collection Time: 12/08/23  5:17 PM   Specimen: BLOOD  Result Value Ref Range Status   Specimen  Description   Final    BLOOD BLOOD RIGHT FOREARM Performed at [facility], [address] [place], [place] [address]    Special Requests   Final    BOTTLES DRAWN AEROBIC AND ANAEROBIC Blood Culture results may not be optimal due to an inadequate volume of blood received in culture bottles Performed at [facility], [address] [place], [place] [address]    Culture  Setup Time   Final    GRAM POSITIVE COCCI ANAEROBIC BOTTLE ONLY Organism ID to follow CRITICAL RESULT CALLED TO, READ BACK BY AND VERIFIED WITHBETHA MAFFUCCI [facility] AT 1042 12/09/23 RAM Performed at [facility], [address], [place], [place] [address]    Culture STAPHYLOCOCCUS AUREUS (A)  Final   Report Status 12/11/2023 FINAL  Final   Organism ID, Bacteria STAPHYLOCOCCUS AUREUS  Final      Susceptibility   Staphylococcus aureus - MIC*    CIPROFLOXACIN <=0.5 SENSITIVE Sensitive     ERYTHROMYCIN <=0.25 SENSITIVE Sensitive     GENTAMICIN <=0.5 SENSITIVE Sensitive     OXACILLIN <=0.25 SENSITIVE Sensitive     TETRACYCLINE <=1 SENSITIVE Sensitive     VANCOMYCIN 1 SENSITIVE Sensitive     TRIMETH/SULFA <=10 SENSITIVE Sensitive     CLINDAMYCIN <=0.25 SENSITIVE Sensitive     RIFAMPIN <=0.5 SENSITIVE Sensitive     Inducible Clindamycin NEGATIVE Sensitive     LINEZOLID 2 SENSITIVE Sensitive     * STAPHYLOCOCCUS AUREUS  Blood Culture ID Panel (Reflexed)     Status: Abnormal   Collection Time: 12/08/23  5:17 PM  Result Value Ref Range Status   Enterococcus faecalis NOT DETECTED NOT DETECTED Final   Enterococcus Faecium NOT DETECTED NOT DETECTED Final   Listeria monocytogenes NOT DETECTED NOT DETECTED Final   Staphylococcus species DETECTED (A) NOT DETECTED Final    Comment: CRITICAL RESULT CALLED TO, READ BACK BY AND VERIFIED WITH: MAFFUCCI CLOSE AT 1042 12/09/23 RAM    Staphylococcus aureus (BCID) DETECTED (A) NOT DETECTED Final    Comment: CRITICAL RESULT CALLED TO, READ BACK BY AND  VERIFIED WITH: MAFFUCCI CLOSE AT 1042 12/09/23 RAM    Staphylococcus epidermidis NOT DETECTED NOT DETECTED Final   Staphylococcus lugdunensis NOT DETECTED NOT DETECTED Final   Streptococcus species NOT DETECTED NOT DETECTED Final   Streptococcus agalactiae NOT DETECTED NOT DETECTED Final   Streptococcus pneumoniae NOT DETECTED NOT DETECTED Final   Streptococcus pyogenes NOT DETECTED NOT DETECTED Final   A.calcoaceticus-baumannii NOT DETECTED NOT DETECTED Final   Bacteroides fragilis NOT DETECTED NOT DETECTED Final   Enterobacterales NOT DETECTED NOT DETECTED Final   Enterobacter cloacae complex NOT DETECTED NOT DETECTED Final   Escherichia coli NOT DETECTED NOT DETECTED Final   Klebsiella aerogenes NOT DETECTED NOT DETECTED Final   Klebsiella oxytoca NOT DETECTED NOT DETECTED Final   Klebsiella pneumoniae NOT DETECTED NOT DETECTED Final   Proteus species NOT DETECTED NOT DETECTED Final   Salmonella species NOT DETECTED NOT DETECTED Final   Serratia marcescens NOT DETECTED NOT DETECTED Final   Haemophilus influenzae NOT DETECTED NOT DETECTED Final   Neisseria meningitidis NOT DETECTED NOT DETECTED Final   Pseudomonas aeruginosa NOT DETECTED NOT DETECTED Final   Stenotrophomonas maltophilia NOT DETECTED NOT DETECTED Final   Candida albicans NOT DETECTED NOT DETECTED Final  Candida auris NOT DETECTED NOT DETECTED Final   Candida glabrata NOT DETECTED NOT DETECTED Final   Candida krusei NOT DETECTED NOT DETECTED Final   Candida parapsilosis NOT DETECTED NOT DETECTED Final   Candida tropicalis NOT DETECTED NOT DETECTED Final   Cryptococcus neoformans/gattii NOT DETECTED NOT DETECTED Final   Meth resistant mecA/C and MREJ NOT DETECTED NOT DETECTED Final    Comment: Performed at [facility], [address] [place], [place] [address]  Blood culture (routine x 2)     Status: None   Collection Time: 12/08/23  7:08 PM   Specimen: BLOOD  Result Value Ref Range Status   Specimen  Description BLOOD BLOOD LEFT ARM  Final   Special Requests   Final    BOTTLES DRAWN AEROBIC AND ANAEROBIC Blood Culture adequate volume   Culture   Final    NO GROWTH 5 DAYS Performed at [facility], [address] [place], [place] [address]    Report Status 12/13/2023 FINAL  Final  Culture, blood (Routine X 2) w Reflex to ID Panel     Status: None   Collection Time: 12/11/23  6:38 AM   Specimen: BLOOD  Result Value Ref Range Status   Specimen Description BLOOD LEFT ANTECUBITAL  Final   Special Requests   Final    BOTTLES DRAWN AEROBIC AND ANAEROBIC Blood Culture results may not be optimal due to an inadequate volume of blood received in culture bottles   Culture   Final    NO GROWTH 5 DAYS Performed at [facility], [address] [place], [place] [address]    Report Status 12/16/2023 FINAL  Final  Culture, blood (Routine X 2) w Reflex to ID Panel     Status: None   Collection Time: 12/11/23  6:38 AM   Specimen: BLOOD LEFT HAND  Result Value Ref Range Status   Specimen Description BLOOD LEFT HAND  Final   Special Requests   Final    BOTTLES DRAWN AEROBIC AND ANAEROBIC Blood Culture adequate volume   Culture   Final    NO GROWTH 5 DAYS Performed at [facility], [address] [place], [place] [address]    Report Status 12/16/2023 FINAL  Final  Aerobic/Anaerobic Culture w Gram Stain (surgical/deep wound)     Status: None (Preliminary result)   Collection Time: 12/16/23  4:36 PM   Specimen: Wound; Synovial Fluid  Result Value Ref Range Status   Specimen Description   Final    WOUND Performed at [facility], [address] [place], [place] [address]    Special Requests   Final    NONE Performed at [facility], [address] [place], [place] [address]    Gram Stain NO WBC SEEN NO ORGANISMS SEEN   Final   Culture   Final    NO GROWTH < 12 HOURS Performed at [facility], [address] [place], [place]  [address]    Report Status PENDING  Incomplete    Procedures and diagnostic studies:  IR US  DRAIN/INJ MAJOR JOINT/BURSA Result Date: 12/16/2023 INDICATION: 89 year old with possible right shoulder joint infection EXAM: IR DRAIN/INJ MAJOR JOINT/BURSA MEDICATIONS: None ANESTHESIA/SEDATION: None COMPLICATIONS: None immediate. PROCEDURE: Informed written consent was obtained from the patient after a thorough discussion of the procedural risks, benefits and alternatives. All questions were addressed. Maximal Sterile Barrier Technique was utilized including caps, mask, sterile gowns, sterile gloves, sterile drape, hand hygiene and skin antiseptic. A timeout was  performed prior to the initiation of the procedure. Ultrasound was used to the right shoulder. There is a trace minimally complex fluid collection within the joint capsule. Local anesthesia was attained by infiltration with 1% lidocaine . Under real-time ultrasound guidance, an 18 gauge needle was advanced into the fluid collection. Aspiration yields only a drop of synovial fluid. Therefore, 1 cc of sterile normal saline was lavaged into the joint capsule and then 3 aspirated yielding 1 cc of minimally bloody fluid. This will be sent for Gram stain and culture. IMPRESSION: Sonographically, there is minimal right shoulder joint effusion. Certainly less than seen on the prior MRI from this past July. Successful aspiration of a very small amount of minimally bloody synovial fluid. No gross purulence. Electronically Signed   By: Wilkie Lent M.D.   On: 12/16/2023 16:35               LOS: 9 days   Namrata Dangler  [facility]   Pager on [email or web address]. If 7PM-7AM, please contact night-coverage at [email or web address]     12/17/2023, 3:41 PM

## 2023-12-17 NOTE — Plan of Care (Signed)
  Problem: Fluid Volume: Goal: Hemodynamic stability will improve Outcome: Progressing   Problem: Clinical Measurements: Goal: Diagnostic test results will improve Outcome: Progressing Goal: Signs and symptoms of infection will decrease Outcome: Progressing   Problem: Education: Goal: Knowledge of General Education information will improve Description: Including pain rating scale, medication(s)/side effects and non-pharmacologic comfort measures Outcome: Progressing   Problem: Health Behavior/Discharge Planning: Goal: Ability to manage health-related needs will improve Outcome: Progressing

## 2023-12-17 NOTE — Plan of Care (Signed)
  Problem: Fluid Volume: Goal: Hemodynamic stability will improve Outcome: Progressing   Problem: Clinical Measurements: Goal: Diagnostic test results will improve Outcome: Progressing Goal: Signs and symptoms of infection will decrease Outcome: Progressing   Problem: Respiratory: Goal: Ability to maintain adequate ventilation will improve Outcome: Progressing   Problem: Education: Goal: Knowledge of General Education information will improve Description: Including pain rating scale, medication(s)/side effects and non-pharmacologic comfort measures Outcome: Progressing   Problem: Health Behavior/Discharge Planning: Goal: Ability to manage health-related needs will improve Outcome: Progressing   Problem: Clinical Measurements: Goal: Ability to maintain clinical measurements within normal limits will improve Outcome: Progressing Goal: Will remain free from infection Outcome: Progressing Goal: Diagnostic test results will improve Outcome: Progressing Goal: Respiratory complications will improve Outcome: Progressing Goal: Cardiovascular complication will be avoided Outcome: Progressing   Problem: Activity: Goal: Risk for activity intolerance will decrease Outcome: Progressing   Problem: Nutrition: Goal: Adequate nutrition will be maintained Outcome: Progressing   Problem: Coping: Goal: Level of anxiety will decrease Outcome: Progressing   Problem: Elimination: Goal: Will not experience complications related to bowel motility Outcome: Progressing Goal: Will not experience complications related to urinary retention Outcome: Progressing   Problem: Pain Managment: Goal: General experience of comfort will improve and/or be controlled Outcome: Progressing   Problem: Safety: Goal: Ability to remain free from injury will improve Outcome: Progressing   Problem: Skin Integrity: Goal: Risk for impaired skin integrity will decrease Outcome: Progressing   Problem:  Safety: Goal: Non-violent Restraint(s) Outcome: Progressing

## 2023-12-18 ENCOUNTER — Inpatient Hospital Stay

## 2023-12-18 DIAGNOSIS — A419 Sepsis, unspecified organism: Secondary | ICD-10-CM | POA: Diagnosis not present

## 2023-12-18 DIAGNOSIS — N390 Urinary tract infection, site not specified: Secondary | ICD-10-CM | POA: Diagnosis not present

## 2023-12-18 LAB — PROTIME-INR
INR: 3 — ABNORMAL HIGH (ref 0.8–1.2)
Prothrombin Time: 32.5 s — ABNORMAL HIGH (ref 11.4–15.2)

## 2023-12-18 NOTE — Consult Note (Signed)
 PHARMACY - ANTICOAGULATION CONSULT NOTE  Pharmacy Consult for warfarin  Indication: Afib   Allergies  Allergen Reactions   Penicillins Rash    Has patient had a PCN reaction causing immediate rash, facial/tongue/throat swelling, SOB or lightheadedness with hypotension: Unknown Has patient had a PCN reaction causing severe rash involving mucus membranes or skin necrosis: Unknown Has patient had a PCN reaction that required hospitalization: Unknown Has patient had a PCN reaction occurring within the last 10 years: Unknown If all of the above answers are NO, then may proceed with Cephalosporin use.     Patient Measurements: Weight: 59.7 kg (131 lb 9.8 oz)  Vital Signs: Temp: 97.7 F (36.5 C) (09/28 1000) Temp Source: Axillary (09/28 1000) BP: 139/53 (09/28 1000) Pulse Rate: 82 (09/28 1000)  Labs: Recent Labs    12/16/23 0630 12/17/23 0514 12/17/23 0900 12/17/23 1021 12/18/23 1300  HGB 8.1*  --   --  10.0*  --   HCT 26.0*  --   --  31.4*  --   PLT 241  --   --  267  --   LABPROT 32.5* 32.3*  --   --  32.5*  INR 3.0* 3.0*  --   --  3.0*  CREATININE 1.29*  --  1.16*  --   --     Estimated Creatinine Clearance: 26 mL/min (A) (by C-G formula based on SCr of 1.16 mg/dL (H)).   Medical History: Past Medical History:  Diagnosis Date   Arthritis    Atrial fibrillation (HCC)    Hypertension    Urge urinary incontinence     Medications:  Reported home regimen is Warfarin 1 mg po daily with last dose 12/08/23  Assessment: 89 yo female with a PMH of A.fib, HTN, and CAD presented to ED due to altered mental status. In ED patient found to have supratherapeutic INR.  Pharmacy consulted to manage warfarin dosing.  DDI: Ancef  (increase INR) Diet: NPO, last Ensure given on 9/23  Goal of Therapy:  INR 2-3 Monitor platelets by anticoagulation protocol: Yes   Date INR Warfarin Dose  9/18 8.8 1 mg (PTA)  9/19 10.1 Hold. IV vitamin K  given.   9/20 1.7 1 mg  9/21 2.8 Hold   9/22 3.3 Hold  9/23 3.5 Hold  9/24 3.2 Hold  9/25 3.5 Hold  9/26 3.0 Hold  9/27 3.0 Hold  9/28 3.0 Hold         Plan:  INR 3.0 again INR trend potentially due to poor diet, (albumin 1.9 on 9/26), and acute illness. Dysphagia 1 diet Given INR at highest end of goal and previous INR up trend after 1 mg given on 9/20, will continue to HOLD warfarin dose today Daily INR while inpatient and CBC at least every 72 hours  Suzann Allean LABOR, PharmD Clinical Pharmacist 12/18/2023 1:44 PM

## 2023-12-18 NOTE — Progress Notes (Addendum)
 Speech Language Pathology Treatment: Dysphagia  Patient Details Name: Cassandra Weiss MRN: [id number] DOB: 1932-09-24 Today's Date: 12/18/2023 Time: 9164-9084 SLP Time Calculation (min) (ACUTE ONLY): 40 min  Assessment / Plan / Recommendation Clinical Impression  Pt seen for ongoing assessment of swallowing. She was alert, verbally responsive and engaged w/ this SLP when asked questions; no spontaneous conversation. Breakfast tray present. Pt is on RA; wbc wnl. Afebrile. OF NOTE: Pt is Missing MOST Dentition and does not wear Dentures- they broke.   Pt explained general aspiration precautions and agreed verbally to the need for following them especially sitting upright for all oral intake and taking small bites/sips -- supported behind the back more for full upright sitting. Pt immediately stated she was not very hungry; encouragement was given during po's this session. She assisted in feeding self by [employer] for sips. Trials of Minced foods, purees, and thin liquids via straw given. No overt clinical s/s of aspiration were noted w/ any consistency; respiratory status remained calm and unlabored, vocal quality clear b/t trials. Oral phase appeared grossly [facility] for bolus management, mashing/gumming the Minced solids, and timely A-P transfer for swallowing; oral clearing achieved w/ all consistencies. NSG denied any deficits in swallowing as well. Pt again was given encouragement w/ po intake during session.    Pt appears at reduced risk for aspiration when following general aspiration precautions w/ modified food consistencies- Minced foods, moistened for ease of mastication in setting of Missing MOST Dentition.  Recommend a MINCED foods diet w/ gravies added to moisten foods; Thin liquids. Recommend general aspiration precautions; tray setup and sitting up support at meals. Support w/ feeding as needed- encouragement w/ oral intake to meet nutritional needs. Pills CRUSHED vs WHOLE in Puree for  ease and safety.  ST services will monitor next 1-2 days for any further Acute needs, education. F/u at her next venue of care if needs indicate is recommended. Dietician f/u for support recommended. MD/NSG updated. Precautions posted at bedside, chart.       HPI HPI: Cassandra Weiss is a 89 y.o. female  with medical history significant of atrial fibrillation, hypertension, urge incontinence, chronic indwelling Foley catheter, hyperlipidemia coronary artery disease, history of stroke, iron deficiency anemia who was brought to the ER (12/08/2023) with altered mental status (less responsive) and hypotension. DG Chest 12/11/2023 Left PICC line tip in the SVC. Cardiomegaly with vascular congestion and suspected early interstitial edema.  Head CT previous admit: chronic small vessel ischemic changes. Per chart notes, pt has a hiatal hernia.       SLP Plan  Continue with current plan of care          Recommendations  Diet recommendations: Dysphagia 2 (fine chop);Thin liquid (added purees) Liquids provided via: Cup;Straw (monitor) Medication Administration: Crushed with puree (easier, best) Supervision: Full supervision/cueing for compensatory strategies;Patient able to self feed (encouragement) Compensations: Minimize environmental distractions;Slow rate;Small sips/bites;Lingual sweep for clearance of pocketing;Follow solids with liquid Postural Changes and/or Swallow Maneuvers: Out of bed for meals;Seated upright 90 degrees;Upright 30-60 min after meal                 (Dietician; Palliative Care) Oral care BID;Staff/trained caregiver to provide oral care   Frequent or constant Supervision/Assistance (enocuragement) Dysphagia, unspecified (R13.10) (lacking MOST Dentition; decreased interest in oral intake; needs support and encouragement at meals)     Continue with current plan of care       Comer Portugal, MS, CCC-SLP Speech Language Pathologist Rehab Services;  [facility] [phone] (ascom) Elion Hocker  12/18/2023, 2:55 PM

## 2023-12-18 NOTE — Progress Notes (Signed)
 Progress Note    Cassandra Weiss  FMW:[id number] DOB: 1932-07-03  DOA: 12/08/2023 PCP: Sadie Manna, MD      Brief Narrative:    Medical records reviewed and are as summarized below:  Cassandra Weiss is a 89 y.o. female  with medical history significant of atrial fibrillation, hypertension, urge incontinence, chronic indwelling Foley catheter, hyperlipidemia coronary artery disease, history of stroke, iron deficiency anemia who was brought to the ER with altered mental status (less responsive) and hypotension.   Vital signs in the ED: Temperature 96.3 F and 95.6 F (per rectum), respiratory rate 24, pulse 85, BP 116/64 and dropped to 93/42, O2 sat 98% on room air.  Diagnostic data significant for sodium 156, BUN 50, creatinine 1.71, albumin 2.7, WBC 8.5, hemoglobin 9.3, platelet count 334, INR 8.8, urinalysis positive for UTI.  Chest x-ray and CT head did not show any acute abnormality.  CT renal stone: IMPRESSION: 1. Trace to small left and trace right pleural effusions. 2. Cardiomegaly. 3. Small hiatal hernia. 4. Nonobstructive 4 mm left nephrolithiasis. 5. Marked L5-S1 disc bulge. 6. Otherwise limited evaluation on this noncontrast study. 7.  Aortic Atherosclerosis (ICD10-I70.0).    She was admitted to the hospital for hypernatremia, Coumadin  coagulopathy with supratherapeutic INR, sepsis secondary to acute UTI.       Assessment/Plan:   Principal Problem:   Sepsis secondary to UTI [facility]) Active Problems:   Acute urinary tract infection   MSSA bacteremia   AKI (acute kidney injury)   Hypernatremia   Acute metabolic encephalopathy   Benign essential HTN   Paroxysmal A-fib (HCC)   Hypercoagulable state   Coronary artery disease   Iron deficiency anemia   Hyperlipidemia   Urge urinary incontinence   Altered mental status   AMS (altered mental status)   Hypokalemia   Pyogenic arthritis of right shoulder region [facility]   Nutrition Problem: Increased  nutrient needs Etiology: wound healing  Signs/Symptoms: estimated needs   Body mass index is 24.07 kg/m.   Sepsis secondary to acute UTI, MSSA bacteremia: Urine culture showed pansensitive E. coli.  Blood culture showed MSSA.  No growth on repeat blood cultures from 12/11/2022. 2D echo showed preserved EF. S/p TEE on 12/14/2023 did not show any vegetations. She was evaluated by Dr. Edie, orthopedic surgeon. CT right shoulder showed severe glenohumeral degenerative changes with joint effusion and probable small intra-articular loose bodies. S/p aspiration of right shoulder joint on 12/16/2023.  Synovial fluid culture is pending. Continue IV cefazolin .   AKI on CKD stage IIIa: Improved Hypernatremia: Improved Hypokalemia: Improved   Vomiting: Antiemetics as needed. She is having bowel movements.   Acute metabolic encephalopathy: Improved but suspect patient has baseline cognitive impairment.   Paroxysmal atrial fibrillation, history of stroke: Warfarin on hold Warfarin coagulopathy: Improved.  INR down to 3.0.  Continue to monitor INR and adjust Coumadin  accordingly.   INR went up as high as 10.1.   Black stools: Improved.  Stool charted on 12/16/2023 was brown in color  H&H stable.  Hemoglobin up from 8.1-10.0   Comorbidities include CAD, severe asymmetric LVH of septal segment on 2D echo, severe hypoalbuminemia, right pleural effusion, hyperlipidemia, urge incontinence, chronic indwelling Foley catheter (replaced on day of admission, 12/08/2023)   General Weakness: PT recommended discharge to SNF.  Awaiting placement.   Pain in lower extremities: Venous duplex of lower extremities negative for DVT.  X-ray right knee showed mild arthritis of right knee.  X-ray right foot did not  show any acute osseous abnormality.   Plan discussed with Cassandra Weiss, son, over the phone.  He has been updated with all test results.  Diet Order             DIET DYS 2 Room service appropriate?  Yes with Assist; Fluid consistency: Thin  Diet effective now                                  Consultants: Cardiologist ID specialist Orthopedic surgery Interventional radiologist  Procedures: TEE on 12/14/2023 S/p aspiration of right shoulder joint on 12/16/2023.    Medications:    amLODipine   5 mg Oral Daily   vitamin C   500 mg Oral BID   atorvastatin   80 mg Oral Daily   carvedilol   25 mg Oral BID   Chlorhexidine  Gluconate Cloth  6 each Topical Daily   feeding supplement  237 mL Oral TID BM   gabapentin   300 mg Oral TID   hydrALAZINE   50 mg Oral TID   lidocaine   1 patch Transdermal Q24H   multivitamin with minerals  1 tablet Oral Daily   sodium chloride  flush  10-40 mL Intracatheter Q12H   Warfarin - Pharmacist Dosing Inpatient   Does not apply q1600   zinc  sulfate (50mg  elemental zinc )  220 mg Oral Daily   Continuous Infusions:   ceFAZolin  (ANCEF ) IV 2 g (12/18/23 1016)     Anti-infectives (From admission, onward)    Start     Dose/Rate Route Frequency Ordered Stop   12/15/23 2200  ceFAZolin  (ANCEF ) IVPB 2g/100 mL premix        2 g 200 mL/hr over 30 Minutes Intravenous Every 12 hours 12/15/23 0845     12/09/23 2200  levofloxacin  (LEVAQUIN ) IVPB 500 mg  Status:  Discontinued        500 mg 100 mL/hr over 60 Minutes Intravenous Every 24 hours 12/09/23 2123 12/11/23 1329   12/09/23 1800  cefTRIAXone  (ROCEPHIN ) 2 g in sodium chloride  0.9 % 100 mL IVPB  Status:  Discontinued        2 g 200 mL/hr over 30 Minutes Intravenous Every 24 hours 12/08/23 1907 12/09/23 1626   12/09/23 1800  ceFAZolin  (ANCEF ) IVPB 2g/100 mL premix  Status:  Discontinued        2 g 200 mL/hr over 30 Minutes Intravenous Every 8 hours 12/09/23 1626 12/15/23 0845   12/08/23 1745  cefTRIAXone  (ROCEPHIN ) 2 g in sodium chloride  0.9 % 100 mL IVPB        2 g 200 mL/hr over 30 Minutes Intravenous Once 12/08/23 1730 12/08/23 1821              Family  Communication/Anticipated D/C date and plan/Code Status   DVT prophylaxis:      Code Status: Do not attempt resuscitation (DNR) PRE-ARREST INTERVENTIONS DESIRED  Family Communication: Plan discussed with son over the phone  Disposition Plan: Plan to discharge to SNF   Status is: Inpatient Remains inpatient appropriate because: MSSA bacteremia       Subjective:   No acute events reported.  Pain in the right leg is better.  No other complaints.  She is more alert today.  Objective:    Vitals:   12/17/23 2015 12/18/23 0513 12/18/23 0812 12/18/23 1000  BP: 124/63 128/65 (!) 150/58 (!) 139/53  Pulse: 82 90 75 82  Resp: 14 13 18 17   Temp: 97.6 F (36.4 C) 97.7  F (36.5 C) 98.6 F (37 C) 97.7 F (36.5 C)  TempSrc:  Oral  Axillary  SpO2: 99% 98% 100% 99%  Weight:       No data found.  No intake or output data in the 24 hours ending 12/18/23 1448  Filed Weights   12/11/23 1101  Weight: 59.7 kg    Exam:  GEN: NAD SKIN: Warm and dry.  Lidocaine  patch on right knee EYES: No pallor or icterus ENT: MMM CV: RRR PULM: CTA B ABD: soft, ND, NT, +BS CNS: AAO x 2 (person and place), generally weak and unable to move lift both legs off the bed. EXT: No edema or tenderness     Data Reviewed:   I have personally reviewed following labs and imaging studies:  Labs: Labs show the following:   Basic Metabolic Panel: Recent Labs  Lab 12/13/23 0522 12/14/23 0632 12/15/23 0518 12/16/23 0630 12/17/23 0900  NA 142 143 141 142 142  K 3.6 3.3* 3.4* 3.7 3.9  CL 108 107 106 108 110  CO2 24 24 24 24  20*  GLUCOSE 81 99 72 82 75  BUN 12 11 13 16 16   CREATININE 1.03* 1.05* 1.14* 1.29* 1.16*  CALCIUM  8.4* 8.6* 8.2* 8.4* 8.4*  MG  --   --   --  2.1  --   PHOS  --   --   --  2.7 2.5   GFR Estimated Creatinine Clearance: 26 mL/min (A) (by C-G formula based on SCr of 1.16 mg/dL (H)). Liver Function Tests: Recent Labs  Lab 12/16/23 0630 12/17/23 0900  ALBUMIN 1.9*  1.9*   No results for input(s): LIPASE, AMYLASE in the last 168 hours.  No results for input(s): AMMONIA in the last 168 hours. Coagulation profile Recent Labs  Lab 12/14/23 0632 12/15/23 0518 12/16/23 0630 12/17/23 0514 12/18/23 1300  INR 3.2* 3.5* 3.0* 3.0* 3.0*    CBC: Recent Labs  Lab 12/13/23 0522 12/14/23 0632 12/16/23 0630 12/17/23 1021  WBC 8.5 8.3 9.0 8.3  HGB 9.9* 9.1* 8.1* 10.0*  HCT 31.6* 28.8* 26.0* 31.4*  MCV 88.0 87.8 88.7 87.5  PLT 248 217 241 267   Cardiac Enzymes: No results for input(s): CKTOTAL, CKMB, CKMBINDEX, TROPONINI in the last 168 hours. BNP (last 3 results) No results for input(s): PROBNP in the last 8760 hours. CBG: No results for input(s): GLUCAP in the last 168 hours.  D-Dimer: No results for input(s): DDIMER in the last 72 hours. Hgb A1c: No results for input(s): HGBA1C in the last 72 hours. Lipid Profile: No results for input(s): CHOL, HDL, LDLCALC, TRIG, CHOLHDL, LDLDIRECT in the last 72 hours. Thyroid  function studies: No results for input(s): TSH, T4TOTAL, T3FREE, THYROIDAB in the last 72 hours.  Invalid input(s): FREET3 Anemia work up: No results for input(s): VITAMINB12, FOLATE, FERRITIN, TIBC, IRON, RETICCTPCT in the last 72 hours. Sepsis Labs: Recent Labs  Lab 12/13/23 0522 12/14/23 0632 12/16/23 0630 12/17/23 1021  WBC 8.5 8.3 9.0 8.3    Microbiology Recent Results (from the past 240 hours)  Resp panel by RT-PCR (RSV, Flu A&B, Covid) Anterior Nasal Swab     Status: None   Collection Time: 12/08/23  2:56 PM   Specimen: Anterior Nasal Swab  Result Value Ref Range Status   SARS Coronavirus 2 by RT PCR NEGATIVE NEGATIVE Final    Comment: (NOTE) SARS-CoV-2 target nucleic acids are NOT DETECTED.  The SARS-CoV-2 RNA is generally detectable in upper respiratory specimens during the acute phase of infection.  The lowest concentration of SARS-CoV-2 viral copies  this assay can detect is 138 copies/mL. A negative result does not preclude SARS-Cov-2 infection and should not be used as the sole basis for treatment or other patient management decisions. A negative result may occur with  improper specimen collection/handling, submission of specimen other than nasopharyngeal swab, presence of viral mutation(s) within the areas targeted by this assay, and inadequate number of viral copies(<138 copies/mL). A negative result must be combined with clinical observations, patient history, and epidemiological information. The expected result is Negative.  Fact Sheet for Patients:  BloggerCourse.com  Fact Sheet for Healthcare Providers:  SeriousBroker.it  This test is no t yet approved or cleared by the [place]  FDA and  has been authorized for detection and/or diagnosis of SARS-CoV-2 by FDA under an Emergency Use Authorization (EUA). This EUA will remain  in effect (meaning this test can be used) for the duration of the COVID-19 declaration under Section 564(b)(1) of the Act, 21 U.S.C.section 360bbb-3(b)(1), unless the authorization is terminated  or revoked sooner.       Influenza A by PCR NEGATIVE NEGATIVE Final   Influenza B by PCR NEGATIVE NEGATIVE Final    Comment: (NOTE) The Xpert Xpress SARS-CoV-2/FLU/RSV plus assay is intended as an aid in the diagnosis of influenza from Nasopharyngeal swab specimens and should not be used as a sole basis for treatment. Nasal washings and aspirates are unacceptable for Xpert Xpress SARS-CoV-2/FLU/RSV testing.  Fact Sheet for Patients: BloggerCourse.com  Fact Sheet for Healthcare Providers: SeriousBroker.it  This test is not yet approved or cleared by the [place]  FDA and has been authorized for detection and/or diagnosis of SARS-CoV-2 by FDA under an Emergency Use Authorization (EUA). This EUA  will remain in effect (meaning this test can be used) for the duration of the COVID-19 declaration under Section 564(b)(1) of the Act, 21 U.S.C. section 360bbb-3(b)(1), unless the authorization is terminated or revoked.     Resp Syncytial Virus by PCR NEGATIVE NEGATIVE Final    Comment: (NOTE) Fact Sheet for Patients: BloggerCourse.com  Fact Sheet for Healthcare Providers: SeriousBroker.it  This test is not yet approved or cleared by the [place]  FDA and has been authorized for detection and/or diagnosis of SARS-CoV-2 by FDA under an Emergency Use Authorization (EUA). This EUA will remain in effect (meaning this test can be used) for the duration of the COVID-19 declaration under Section 564(b)(1) of the Act, 21 U.S.C. section 360bbb-3(b)(1), unless the authorization is terminated or revoked.  Performed at [facility], [address] [place], [place] [address]   Urine Culture (for pregnant, neutropenic or urologic patients or patients with an indwelling urinary catheter)     Status: Abnormal   Collection Time: 12/08/23  4:33 PM   Specimen: Urine, Catheterized  Result Value Ref Range Status   Specimen Description   Final    URINE, CATHETERIZED Performed at [facility], [address] [place], [place] [address]    Special Requests   Final    NONE Performed at [facility], [address] [place], [place] [address]    Culture >=100,000 COLONIES/mL ESCHERICHIA COLI (A)  Final   Report Status 12/11/2023 FINAL  Final   Organism ID, Bacteria ESCHERICHIA COLI (A)  Final      Susceptibility   Escherichia coli - MIC*    AMPICILLIN 4 SENSITIVE Sensitive     CEFAZOLIN  (URINE) Value in next row Sensitive      <=1 SENSITIVEThis is a modified  FDA-approved test that has been validated and its performance characteristics determined by the reporting laboratory.  This laboratory is certified under the  Clinical Laboratory Improvement Amendments CLIA as qualified to perform high complexity clinical laboratory testing.    CEFEPIME Value in next row Sensitive      <=1 SENSITIVEThis is a modified FDA-approved test that has been validated and its performance characteristics determined by the reporting laboratory.  This laboratory is certified under the Clinical Laboratory Improvement Amendments CLIA as qualified to perform high complexity clinical laboratory testing.    ERTAPENEM Value in next row Sensitive      <=1 SENSITIVEThis is a modified FDA-approved test that has been validated and its performance characteristics determined by the reporting laboratory.  This laboratory is certified under the Clinical Laboratory Improvement Amendments CLIA as qualified to perform high complexity clinical laboratory testing.    CEFTRIAXONE  Value in next row Sensitive      <=1 SENSITIVEThis is a modified FDA-approved test that has been validated and its performance characteristics determined by the reporting laboratory.  This laboratory is certified under the Clinical Laboratory Improvement Amendments CLIA as qualified to perform high complexity clinical laboratory testing.    CIPROFLOXACIN Value in next row Sensitive      <=1 SENSITIVEThis is a modified FDA-approved test that has been validated and its performance characteristics determined by the reporting laboratory.  This laboratory is certified under the Clinical Laboratory Improvement Amendments CLIA as qualified to perform high complexity clinical laboratory testing.    GENTAMICIN Value in next row Sensitive      <=1 SENSITIVEThis is a modified FDA-approved test that has been validated and its performance characteristics determined by the reporting laboratory.  This laboratory is certified under the Clinical Laboratory Improvement Amendments CLIA as qualified to perform high complexity clinical laboratory testing.    NITROFURANTOIN Value in next row Sensitive       <=1 SENSITIVEThis is a modified FDA-approved test that has been validated and its performance characteristics determined by the reporting laboratory.  This laboratory is certified under the Clinical Laboratory Improvement Amendments CLIA as qualified to perform high complexity clinical laboratory testing.    TRIMETH/SULFA Value in next row Sensitive      <=1 SENSITIVEThis is a modified FDA-approved test that has been validated and its performance characteristics determined by the reporting laboratory.  This laboratory is certified under the Clinical Laboratory Improvement Amendments CLIA as qualified to perform high complexity clinical laboratory testing.    AMPICILLIN/SULBACTAM Value in next row Sensitive      <=1 SENSITIVEThis is a modified FDA-approved test that has been validated and its performance characteristics determined by the reporting laboratory.  This laboratory is certified under the Clinical Laboratory Improvement Amendments CLIA as qualified to perform high complexity clinical laboratory testing.    PIP/TAZO Value in next row Sensitive      <=4 SENSITIVEThis is a modified FDA-approved test that has been validated and its performance characteristics determined by the reporting laboratory.  This laboratory is certified under the Clinical Laboratory Improvement Amendments CLIA as qualified to perform high complexity clinical laboratory testing.    MEROPENEM Value in next row Sensitive      <=4 SENSITIVEThis is a modified FDA-approved test that has been validated and its performance characteristics determined by the reporting laboratory.  This laboratory is certified under the Clinical Laboratory Improvement Amendments CLIA as qualified to perform high complexity clinical laboratory testing.    * >=100,000 COLONIES/mL ESCHERICHIA COLI  Blood culture (routine  x 2)     Status: Abnormal   Collection Time: 12/08/23  5:17 PM   Specimen: BLOOD  Result Value Ref Range Status   Specimen  Description   Final    BLOOD BLOOD RIGHT FOREARM Performed at [facility], [address] [place], [place] [address]    Special Requests   Final    BOTTLES DRAWN AEROBIC AND ANAEROBIC Blood Culture results may not be optimal due to an inadequate volume of blood received in culture bottles Performed at [facility], [address] [place], [place] [address]    Culture  Setup Time   Final    GRAM POSITIVE COCCI ANAEROBIC BOTTLE ONLY Organism ID to follow CRITICAL RESULT CALLED TO, READ BACK BY AND VERIFIED WITHBETHA MAFFUCCI [facility] AT 1042 12/09/23 RAM Performed at [facility], [address], [place], [place] [address]    Culture STAPHYLOCOCCUS AUREUS (A)  Final   Report Status 12/11/2023 FINAL  Final   Organism ID, Bacteria STAPHYLOCOCCUS AUREUS  Final      Susceptibility   Staphylococcus aureus - MIC*    CIPROFLOXACIN <=0.5 SENSITIVE Sensitive     ERYTHROMYCIN <=0.25 SENSITIVE Sensitive     GENTAMICIN <=0.5 SENSITIVE Sensitive     OXACILLIN <=0.25 SENSITIVE Sensitive     TETRACYCLINE <=1 SENSITIVE Sensitive     VANCOMYCIN 1 SENSITIVE Sensitive     TRIMETH/SULFA <=10 SENSITIVE Sensitive     CLINDAMYCIN <=0.25 SENSITIVE Sensitive     RIFAMPIN <=0.5 SENSITIVE Sensitive     Inducible Clindamycin NEGATIVE Sensitive     LINEZOLID 2 SENSITIVE Sensitive     * STAPHYLOCOCCUS AUREUS  Blood Culture ID Panel (Reflexed)     Status: Abnormal   Collection Time: 12/08/23  5:17 PM  Result Value Ref Range Status   Enterococcus faecalis NOT DETECTED NOT DETECTED Final   Enterococcus Faecium NOT DETECTED NOT DETECTED Final   Listeria monocytogenes NOT DETECTED NOT DETECTED Final   Staphylococcus species DETECTED (A) NOT DETECTED Final    Comment: CRITICAL RESULT CALLED TO, READ BACK BY AND VERIFIED WITH: MAFFUCCI CLOSE AT 1042 12/09/23 RAM    Staphylococcus aureus (BCID) DETECTED (A) NOT DETECTED Final    Comment: CRITICAL RESULT CALLED TO, READ BACK BY AND  VERIFIED WITH: MAFFUCCI CLOSE AT 1042 12/09/23 RAM    Staphylococcus epidermidis NOT DETECTED NOT DETECTED Final   Staphylococcus lugdunensis NOT DETECTED NOT DETECTED Final   Streptococcus species NOT DETECTED NOT DETECTED Final   Streptococcus agalactiae NOT DETECTED NOT DETECTED Final   Streptococcus pneumoniae NOT DETECTED NOT DETECTED Final   Streptococcus pyogenes NOT DETECTED NOT DETECTED Final   A.calcoaceticus-baumannii NOT DETECTED NOT DETECTED Final   Bacteroides fragilis NOT DETECTED NOT DETECTED Final   Enterobacterales NOT DETECTED NOT DETECTED Final   Enterobacter cloacae complex NOT DETECTED NOT DETECTED Final   Escherichia coli NOT DETECTED NOT DETECTED Final   Klebsiella aerogenes NOT DETECTED NOT DETECTED Final   Klebsiella oxytoca NOT DETECTED NOT DETECTED Final   Klebsiella pneumoniae NOT DETECTED NOT DETECTED Final   Proteus species NOT DETECTED NOT DETECTED Final   Salmonella species NOT DETECTED NOT DETECTED Final   Serratia marcescens NOT DETECTED NOT DETECTED Final   Haemophilus influenzae NOT DETECTED NOT DETECTED Final   Neisseria meningitidis NOT DETECTED NOT DETECTED Final   Pseudomonas aeruginosa NOT DETECTED NOT DETECTED Final   Stenotrophomonas maltophilia NOT DETECTED NOT DETECTED Final   Candida albicans NOT DETECTED NOT DETECTED Final   Candida auris NOT DETECTED  NOT DETECTED Final   Candida glabrata NOT DETECTED NOT DETECTED Final   Candida krusei NOT DETECTED NOT DETECTED Final   Candida parapsilosis NOT DETECTED NOT DETECTED Final   Candida tropicalis NOT DETECTED NOT DETECTED Final   Cryptococcus neoformans/gattii NOT DETECTED NOT DETECTED Final   Meth resistant mecA/C and MREJ NOT DETECTED NOT DETECTED Final    Comment: Performed at [facility], [address] [place], [place] [address]  Blood culture (routine x 2)     Status: None   Collection Time: 12/08/23  7:08 PM   Specimen: BLOOD  Result Value Ref Range Status   Specimen  Description BLOOD BLOOD LEFT ARM  Final   Special Requests   Final    BOTTLES DRAWN AEROBIC AND ANAEROBIC Blood Culture adequate volume   Culture   Final    NO GROWTH 5 DAYS Performed at [facility], [address] [place], [place] [address]    Report Status 12/13/2023 FINAL  Final  Culture, blood (Routine X 2) w Reflex to ID Panel     Status: None   Collection Time: 12/11/23  6:38 AM   Specimen: BLOOD  Result Value Ref Range Status   Specimen Description BLOOD LEFT ANTECUBITAL  Final   Special Requests   Final    BOTTLES DRAWN AEROBIC AND ANAEROBIC Blood Culture results may not be optimal due to an inadequate volume of blood received in culture bottles   Culture   Final    NO GROWTH 5 DAYS Performed at [facility], [address] [place], [place] [address]    Report Status 12/16/2023 FINAL  Final  Culture, blood (Routine X 2) w Reflex to ID Panel     Status: None   Collection Time: 12/11/23  6:38 AM   Specimen: BLOOD LEFT HAND  Result Value Ref Range Status   Specimen Description BLOOD LEFT HAND  Final   Special Requests   Final    BOTTLES DRAWN AEROBIC AND ANAEROBIC Blood Culture adequate volume   Culture   Final    NO GROWTH 5 DAYS Performed at [facility], [address], [place], [place] [address]    Report Status 12/16/2023 FINAL  Final  Aerobic/Anaerobic Culture w Gram Stain (surgical/deep wound)     Status: None (Preliminary result)   Collection Time: 12/16/23  4:36 PM   Specimen: Wound; Synovial Fluid  Result Value Ref Range Status   Specimen Description   Final    WOUND Performed at [facility], [address] [place], [place] [address]    Special Requests   Final    NONE Performed at [facility], [address] [place], [place] [address]    Gram Stain NO WBC SEEN NO ORGANISMS SEEN   Final   Culture   Final    NO GROWTH 2 DAYS NO ANAEROBES ISOLATED; CULTURE IN PROGRESS FOR 5 DAYS Performed at [facility], [address], [place], [place] [address]    Report Status PENDING  Incomplete    Procedures and diagnostic studies:  DG Foot 2 Views Right Result Date: 12/18/2023 CLINICAL DATA:  Right foot pain. EXAM: RIGHT FOOT - 2 VIEW COMPARISON:  None Available. FINDINGS: There is no evidence of fracture or dislocation. Hammertoe deformity of the toes. No erosions or bone destruction. Mild soft tissue edema. No soft tissue gas. No radiopaque foreign body. IMPRESSION: 1. Mild soft tissue edema. No acute osseous abnormality. 2. Hammertoe deformity of the toes.  Electronically Signed   By: Andrea Gasman M.D.   On: 12/18/2023 14:04   DG Knee 1-2 Views Right Result Date: 12/18/2023 CLINICAL DATA:  Right knee pain. EXAM: RIGHT KNEE - 1-2 VIEW COMPARISON:  None Available. FINDINGS: No evidence of fracture, dislocation, or joint effusion. Mild tibiofemoral joint space narrowing and peripheral spurring. No erosions or focal bone abnormality. Vascular calcifications. Soft tissues are otherwise unremarkable. IMPRESSION: Mild osteoarthritis. No acute findings. Electronically Signed   By: Andrea Gasman M.D.   On: 12/18/2023 14:03   US  Venous Img Lower Bilateral (DVT) Result Date: 12/18/2023 CLINICAL DATA:  [id number] Leg pain, bilateral [id number] EXAM: BILATERAL LOWER EXTREMITY VENOUS DOPPLER ULTRASOUND TECHNIQUE: Gray-scale sonography with graded compression, as well as color Doppler and duplex ultrasound were performed to evaluate the lower extremity deep venous systems from the level of the common femoral vein and including the common femoral, femoral, profunda femoral, popliteal and calf veins including the posterior tibial, peroneal and gastrocnemius veins when visible. The superficial great saphenous vein was also interrogated. Spectral Doppler was utilized to evaluate flow at rest and with distal augmentation maneuvers in the common femoral, femoral and popliteal veins. COMPARISON:  CT AP, 12/08/2023.  FINDINGS: RIGHT LOWER EXTREMITY VENOUS Normal compressibility of the RIGHT common femoral, superficial femoral, and popliteal veins, as well as the visualized calf veins. Visualized portions of profunda femoral vein and great saphenous vein unremarkable. No filling defects to suggest DVT on grayscale or color Doppler imaging. Doppler waveforms show normal direction of venous flow, normal respiratory plasticity and response to augmentation. OTHER No evidence of superficial thrombophlebitis or abnormal fluid collection. Limitations: none LEFT LOWER EXTREMITY VENOUS Normal compressibility of the LEFT common femoral, superficial femoral, and popliteal veins, as well as the visualized calf veins. Visualized portions of profunda femoral vein and great saphenous vein unremarkable. No filling defects to suggest DVT on grayscale or color Doppler imaging. Doppler waveforms show normal direction of venous flow, normal respiratory plasticity and response to augmentation. OTHER No evidence of superficial thrombophlebitis or abnormal fluid collection. Limitations: none IMPRESSION: No evidence of femoropopliteal DVT or superficial thrombophlebitis within either lower extremity. Thom Hall, MD [facility] Electronically Signed   By: Thom Hall M.D.   On: 12/18/2023 07:45   IR US  DRAIN/INJ MAJOR JOINT/BURSA Result Date: 12/16/2023 INDICATION: 89 year old with possible right shoulder joint infection EXAM: IR DRAIN/INJ MAJOR JOINT/BURSA MEDICATIONS: None ANESTHESIA/SEDATION: None COMPLICATIONS: None immediate. PROCEDURE: Informed written consent was obtained from the patient after a thorough discussion of the procedural risks, benefits and alternatives. All questions were addressed. Maximal Sterile Barrier Technique was utilized including caps, mask, sterile gowns, sterile gloves, sterile drape, hand hygiene and skin antiseptic. A timeout was performed prior to the  initiation of the procedure. Ultrasound was used to the right shoulder. There is a trace minimally complex fluid collection within the joint capsule. Local anesthesia was attained by infiltration with 1% lidocaine . Under real-time ultrasound guidance, an 18 gauge needle was advanced into the fluid collection. Aspiration yields only a drop of synovial fluid. Therefore, 1 cc of sterile normal saline was lavaged into the joint capsule and then 3 aspirated yielding 1 cc of minimally bloody fluid. This will be sent for Gram stain and culture. IMPRESSION: Sonographically, there is minimal right shoulder joint effusion. Certainly less than seen on the prior MRI from this past July. Successful aspiration of a very small amount of minimally bloody synovial fluid. No gross purulence. Electronically Signed   By:  Wilkie Lent M.D.   On: 12/16/2023 16:35               LOS: 10 days   Francenia Chimenti  [facility]   Pager on [email or web address]. If 7PM-7AM, please contact night-coverage at [email or web address]     12/18/2023, 2:48 PM

## 2023-12-19 DIAGNOSIS — N390 Urinary tract infection, site not specified: Secondary | ICD-10-CM | POA: Diagnosis not present

## 2023-12-19 DIAGNOSIS — A419 Sepsis, unspecified organism: Secondary | ICD-10-CM | POA: Diagnosis not present

## 2023-12-19 LAB — PROTIME-INR
INR: 2.9 — ABNORMAL HIGH (ref 0.8–1.2)
Prothrombin Time: 31.6 s — ABNORMAL HIGH (ref 11.4–15.2)

## 2023-12-19 LAB — GLUCOSE, CAPILLARY
Glucose-Capillary: 96 mg/dL (ref 70–99)
Glucose-Capillary: 91 mg/dL (ref 70–99)

## 2023-12-19 MED ORDER — DEXTROSE 5 % IV SOLN
1000.0000 mg | Freq: Once | INTRAVENOUS | Status: AC
  Administered 2023-12-20: 1000 mg via INTRAVENOUS
  Filled 2023-12-19: qty 50

## 2023-12-19 MED ORDER — WARFARIN 0.5 MG HALF TABLET
0.5000 mg | ORAL_TABLET | Freq: Once | ORAL | Status: AC
  Administered 2023-12-19: 0.5 mg via ORAL
  Filled 2023-12-19: qty 1

## 2023-12-19 NOTE — Progress Notes (Signed)
 Patient will require hoyer lift in order to transfer from bed to wheelchair.  Without lift patient will be confined to bed

## 2023-12-19 NOTE — Progress Notes (Signed)
 Occupational Therapy Treatment Patient Details Name: Cassandra Weiss MRN: [id number] DOB: 10-29-1932 Today's Date: 12/19/2023   History of present illness Pt is an 89 y.o. female who was brought to the ER with altered mental status and hypotension. She had been less responsive for about 2 days. Pt admitted for management of sepsis secondary to UTI. PMH of atrial fibrillation, hypertension, urge incontinence,  chronic indwelling catheter, hyperlipidemia coronary artery disease, iron deficiency anemia   OT comments  Upon entering the room, pt supine in bed with husband present in room. Pt is sleeping soundly but agreeable with encouragement. Max- total A for bed mobility with HOB elevated. Hand over hand assistance to wash face with warm cloth to alert her further. Pt sitting on EOB with min guard- min A with posterior bias. Pt able to performed 2 sets of 5 knee extension exercises on B LEs while seated with min guard for balance. Pt able to tolerate sitting for ~ 8 minutes and then unable to keep eyes open and needing to return to supine. OT did assist with washing back while she was seated on EOB per pt request. Call bell and all needed items within reach upon exiting the room.       If plan is discharge home, recommend the following:  A lot of help with bathing/dressing/bathroom;Help with stairs or ramp for entrance;Assist for transportation;Assistance with cooking/housework;Two people to help with walking and/or transfers   Equipment Recommendations  Other (comment) (defer to next venue of care)       Precautions / Restrictions Precautions Precautions: Fall Recall of Precautions/Restrictions: Impaired       Mobility Bed Mobility Overal bed mobility: Needs Assistance Bed Mobility: Supine to Sit, Sit to Supine     Supine to sit: Total assist Sit to supine: Total assist        Transfers                   General transfer comment: not attempted secondary to lethargy      Balance Overall balance assessment: Needs assistance Sitting-balance support: Feet supported, Single extremity supported Sitting balance-Leahy Scale: Fair   Postural control: Posterior lean                                 ADL either performed or assessed with clinical judgement   ADL Overall ADL's : Needs assistance/impaired                                       General ADL Comments: seated on EOB with min A static sitting balance while therapist washed her back for her    Extremity/Trunk Assessment              Vision Patient Visual Report: No change from baseline           Communication Communication Communication: Impaired Factors Affecting Communication: Difficulty expressing self;Reduced clarity of speech   Cognition Arousal: Lethargic Behavior During Therapy: Flat affect Cognition: Cognition impaired   Orientation impairments: Place, Time, Situation Awareness: Intellectual awareness impaired, Online awareness impaired                         Following commands: Impaired Following commands impaired: Follows one step commands inconsistently, Follows one step commands with increased time  Cueing   Cueing Techniques: Verbal cues, Gestural cues             Pertinent Vitals/ Pain       Pain Assessment Pain Assessment: Faces Faces Pain Scale: Hurts a little bit Pain Location: R shoulder and R knee Pain Descriptors / Indicators: Guarding, Discomfort Pain Intervention(s): Monitored during session, Repositioned         Frequency  Min 2X/week        Progress Toward Goals  OT Goals(current goals can now be found in the care plan section)  Progress towards OT goals: Progressing toward goals      AM-PAC OT 6 Clicks Daily Activity     Outcome Measure   Help from another person eating meals?: A Little Help from another person taking care of personal grooming?: A Little Help from another person  toileting, which includes using toliet, bedpan, or urinal?: A Lot Help from another person bathing (including washing, rinsing, drying)?: A Lot Help from another person to put on and taking off regular upper body clothing?: A Lot Help from another person to put on and taking off regular lower body clothing?: A Lot 6 Click Score: 14    End of Session    OT Visit Diagnosis: Other abnormalities of gait and mobility (R26.89);Unsteadiness on feet (R26.81);Muscle weakness (generalized) (M62.81)   Activity Tolerance Patient limited by lethargy;Patient limited by fatigue   Patient Left in bed;with call bell/phone within reach;with bed alarm set;with family/visitor present   Nurse Communication          Time: 8461-8443 OT Time Calculation (min): 18 min  Charges: OT General Charges $OT Visit: 1 Visit OT Treatments $Therapeutic Activity: 8-22 mins  Izetta Claude, MS, OTR/L , CBIS ascom [phone]  12/19/23, 4:01 PM

## 2023-12-19 NOTE — TOC Progression Note (Addendum)
 Transition of Care [facility]) - Progression Note    Patient Details  Name: Cassandra Weiss MRN: [id number] Date of Birth: 09/23/1932  Transition of [facility] CM/SW Contact  [name], RN Phone Number: 12/19/2023, 2:19 PM  Clinical Narrative:      Per MD no IV antibiotics needed at discharge.  Bed search did not process correctly in the HUB last week.  Bed search initiated.  VM left for son to confirm plan for SNF.  Per [name] at [employer] they are not able to offer as patient has an outstanding balance and in copay days           Update:  spoke with son [name] who states that spouse and son have determined plan will be for patient to return home with home health services through [facility].  Will need EMS transport home.  Referral made to [facility] with [employer] for hoyer lift. Patient already has hospital bed and WC in the home.  Per son request outpatient palliative referral made to [place] with [employer]         Expected Discharge Plan and Services                                               Social Drivers of Health (SDOH) Interventions SDOH Screenings   Food Insecurity: No Food Insecurity (12/09/2023)  Housing: Low Risk  (12/09/2023)  Transportation Needs: No Transportation Needs (12/09/2023)  Utilities: Not At Risk (12/09/2023)  Financial Resource Strain: Low Risk  (08/17/2023)   Received from [facility]  Physical Activity: Insufficiently Active (01/13/2018)  Social Connections: Moderately Integrated (12/09/2023)  Stress: No Stress Concern Present (01/13/2018)  Tobacco Use: Low Risk  (12/13/2023)    Readmission Risk Interventions    12/13/2023    1:01 PM  Readmission Risk Prevention Plan  Transportation Screening Complete  HRI or Home Care Consult Complete  Palliative Care Screening Complete  Medication Review (RN Care Manager) Complete

## 2023-12-19 NOTE — Progress Notes (Addendum)
 SLP Cancellation Note  Patient Details Name: Cassandra Weiss MRN: [id number] DOB: 02-12-1933   Cancelled treatment:       Reason Eval/Treat Not Completed: Patient declined, no reason specified (chart reviewed; consulted NSG re: pt's status today)  Chart reviewed; consulted NSG who reported pt tolerated bites of ice cream and a few sips of thin liquids but then REFUSED all further po's offered trays today- she put her hands up to cover her mouth as she refused.  Upon going to room to inquire if she would like something to drink or eat, pt declined stating she did not want anything.   F/u w/ NSG and MD. Recommend continuing w/ current Minced foods diet for ease of mastication d/t pt Missing Most Dentition. Recommend referral to Palliative Care for GOC/POC. No further skilled ST services indicated as pt demonstrates oropharyngeal swallowing however a lack of desire for po intake.  Recommend continuing to offer po's often during the day; oral care. Pills in puree. Aspiration precautions. NSG agreed.     Comer Portugal, MS, CCC-SLP Speech Language Pathologist Rehab Services; [facility] [phone] (ascom) Sheehan Stacey 12/19/2023, 5:36 PM

## 2023-12-19 NOTE — Progress Notes (Signed)
 [facility] Liaison note  Received a referral from Corean Haddock, RN for outpatient palliative care services  for patient when she discharges home.    Referral submitted today.    Please call with any palliative care related questions or concerns  Thank you for the opportunity to participate in this patient's care  [facility] Liaison 336 [phone]

## 2023-12-19 NOTE — Progress Notes (Addendum)
 Physical Therapy Treatment Patient Details Name: Cassandra Weiss MRN: [id number] DOB: Jul 26, 1932 Today's Date: 12/19/2023   History of Present Illness Pt is an 89 y.o. female who was brought to the ER with altered mental status and hypotension. She had been less responsive for about 2 days. Pt admitted for management of sepsis secondary to UTI. PMH of atrial fibrillation, hypertension, urge incontinence,  chronic indwelling catheter, hyperlipidemia coronary artery disease, iron deficiency anemia    PT Comments  Pt seems a little more alert today and willing to participate in therapy.   Pt seems to have better trunk control when sitting initially mod A to close supervision. Attempted sit<>stands x3 with STEDY, unable to come into a full stand.  Pt's mobility continues to be limited by poor activity tolerance and weakness.  Continue to recommend post acute rehab <3 hours therapy/day upon d/c.     If plan is discharge home, recommend the following: Two people to help with walking and/or transfers;Two people to help with bathing/dressing/bathroom   Can travel by private vehicle     No  Equipment Recommendations  None recommended by PT    Recommendations for Other Services       Precautions / Restrictions Precautions Precautions: Fall Recall of Precautions/Restrictions: Impaired Restrictions Weight Bearing Restrictions Per Provider Order: No     Mobility  Bed Mobility Overal bed mobility: Needs Assistance Bed Mobility: Supine to Sit, Sit to Supine     Supine to sit: HOB elevated, +2 for physical assistance, Max assist Sit to supine: Total assist, +2 for physical assistance   General bed mobility comments: pt attempted initiation of BLEs, however ultimately needed Max A to reach EOB    Transfers Overall transfer level: Needs assistance Equipment used:  (STEDY) Transfers: Sit to/from Stand Sit to Stand: Max assist, +2 physical assistance           General transfer comment:  Pt seems to have better trunk control when sitting initially mod A to close supervision.  Attempted sit<>stands x3 with Elyn walker, unable to come into a full stand.    Ambulation/Gait               General Gait Details: unable   Stairs             Wheelchair Mobility     Tilt Bed    Modified Rankin (Stroke Patients Only)       Balance Overall balance assessment: Needs assistance Sitting-balance support: Feet supported, Single extremity supported Sitting balance-Leahy Scale: Fair Sitting balance - Comments: ModA ato supervision level.   Standing balance support: Bilateral upper extremity supported, Reliant on assistive device for balance Standing balance-Leahy Scale: Poor Standing balance comment: pulling up on Eva walker, unable to come up into a full stand.                            Communication Communication Communication: Impaired Factors Affecting Communication: Difficulty expressing self;Reduced clarity of speech  Cognition Arousal: Lethargic Behavior During Therapy: Flat affect   PT - Cognitive impairments: History of cognitive impairments                       PT - Cognition Comments: pleasant and agreeable to PT session Following commands: Impaired Following commands impaired: Follows one step commands inconsistently, Follows one step commands with increased time    Cueing Cueing Techniques: Verbal cues, Gestural cues  Exercises  General Comments        Pertinent Vitals/Pain Pain Assessment Pain Assessment: Faces Faces Pain Scale: Hurts a little bit Pain Location: Seems to have pain in R shoulders Pain Descriptors / Indicators: Guarding Pain Intervention(s): Monitored during session, Limited activity within patient's tolerance    Home Living                          Prior Function            PT Goals (current goals can now be found in the care plan section) Acute Rehab PT Goals Patient  Stated Goal: pt unable to participate in goal setting PT Goal Formulation: Patient unable to participate in goal setting Time For Goal Achievement: 12/26/23 Potential to Achieve Goals: Poor Progress towards PT goals: Progressing toward goals    Frequency    Min 2X/week      PT Plan      Co-evaluation              AM-PAC PT 6 Clicks Mobility   Outcome Measure  Help needed turning from your back to your side while in a flat bed without using bedrails?: Total Help needed moving from lying on your back to sitting on the side of a flat bed without using bedrails?: Total Help needed moving to and from a bed to a chair (including a wheelchair)?: Total Help needed standing up from a chair using your arms (e.g., wheelchair or bedside chair)?: Total Help needed to walk in hospital room?: Total Help needed climbing 3-5 steps with a railing? : Total 6 Click Score: 6    End of Session Equipment Utilized During Treatment: Gait belt Activity Tolerance: Patient limited by fatigue;Patient limited by lethargy Patient left: in bed;with call bell/phone within reach Nurse Communication: Mobility status PT Visit Diagnosis: Muscle weakness (generalized) (M62.81);Other abnormalities of gait and mobility (R26.89)     Time: 9089-9069 PT Time Calculation (min) (ACUTE ONLY): 20 min  Charges:    $Therapeutic Activity: 8-22 mins PT General Charges $$ ACUTE PT VISIT: 1 Visit                     Harland Irving, PTA  12/19/23, 9:47 AM

## 2023-12-19 NOTE — Progress Notes (Signed)
 Patient ID: Cassandra Weiss, female   DOB: Jul 07, 1932, 89 y.o.   MRN: [id number]  Subjective: The patient appears to be resting comfortably in bed.  She has no new complaints pertaining to her right shoulder or upper extremity.   Objective: Vital signs in last 24 hours: Temp:  [97.7 F (36.5 C)-98.6 F (37 C)] 97.8 F (36.6 C) (09/29 0448) Pulse Rate:  [70-86] 86 (09/29 0448) Resp:  [16-18] 16 (09/29 0448) BP: (109-150)/(47-68) 144/68 (09/29 0448) SpO2:  [96 %-100 %] 100 % (09/29 0448)  Intake/Output from previous day: 09/28 0701 - 09/29 0700 In: 10 [I.V.:10] Out: -  Intake/Output this shift: No intake/output data recorded.  Recent Labs    12/17/23 1021  HGB 10.0*   Recent Labs    12/17/23 1021  WBC 8.3  RBC 3.59*  HCT 31.4*  PLT 267   Recent Labs    12/17/23 0900  NA 142  K 3.9  CL 110  CO2 20*  BUN 16  CREATININE 1.16*  GLUCOSE 75  CALCIUM  8.4*   Recent Labs    12/18/23 1300 12/19/23 0313  INR 3.0* 2.9*    Physical Exam: Orthopedic examination again is limited to the right shoulder and upper extremity.  These findings remain unchanged as compared to prior examination findings.  Skin inspection of the right shoulder is unremarkable.  No swelling, erythema, ecchymosis, abrasions, or other skin abnormalities are identified.  Her shoulder motion again remains limited due to her severe degenerative joint disease and right hemiparesis.  Her neurovascular status remains unchanged as compared to previous examination findings.  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  Plan: The cultures from the recent shoulder aspiration are showing no growth.  Furthermore, the blood cultures drawn on 12/11/2023 are showing no growth.  Therefore, I do not feel that she has a septic right shoulder at this time.  The patient may continue to be mobilized with physical therapy.  Thank you for asked me to  participate in the care of this most unfortunate woman.  I will sign off at this time.  If there is any need for further orthopedic input during this hospitalization, please reconsult me.  As she is not a surgical candidate, there is no need at this time to arrange for outpatient orthopedic follow-up.   Norleen PARAS Kala Gassmann 12/19/2023, 8:01 AM

## 2023-12-19 NOTE — Consult Note (Signed)
 PHARMACY - ANTICOAGULATION CONSULT NOTE  Pharmacy Consult for warfarin  Indication: Afib   Allergies  Allergen Reactions   Penicillins Rash    Has patient had a PCN reaction causing immediate rash, facial/tongue/throat swelling, SOB or lightheadedness with hypotension: Unknown Has patient had a PCN reaction causing severe rash involving mucus membranes or skin necrosis: Unknown Has patient had a PCN reaction that required hospitalization: Unknown Has patient had a PCN reaction occurring within the last 10 years: Unknown If all of the above answers are NO, then may proceed with Cephalosporin use.     Patient Measurements: Weight: 59.7 kg (131 lb 9.8 oz)  Vital Signs: Temp: 97.8 F (36.6 C) (09/29 0448) Temp Source: Oral (09/29 0448) BP: 144/68 (09/29 0448) Pulse Rate: 86 (09/29 0448)  Labs: Recent Labs    12/17/23 0514 12/17/23 0900 12/17/23 1021 12/18/23 1300 12/19/23 0313  HGB  --   --  10.0*  --   --   HCT  --   --  31.4*  --   --   PLT  --   --  267  --   --   LABPROT 32.3*  --   --  32.5* 31.6*  INR 3.0*  --   --  3.0* 2.9*  CREATININE  --  1.16*  --   --   --     Estimated Creatinine Clearance: 26 mL/min (A) (by C-G formula based on SCr of 1.16 mg/dL (H)).   Medical History: Past Medical History:  Diagnosis Date   Arthritis    Atrial fibrillation (HCC)    Hypertension    Urge urinary incontinence     Medications:  Reported home regimen is Warfarin 1 mg po daily with last dose 12/08/23  Assessment: 89 yo female with a PMH of A.fib, HTN, and CAD presented to ED due to altered mental status. In ED patient found to have supratherapeutic INR.  Pharmacy consulted to manage warfarin dosing.  DDI: Ancef  (increase INR) Diet: NPO, last Ensure given on 9/23  Goal of Therapy:  INR 2-3 Monitor platelets by anticoagulation protocol: Yes   Date INR Warfarin Dose  9/18 8.8 1 mg (PTA)  9/19 10.1 Hold. IV vitamin K  given.   9/20 1.7 1 mg  9/21 2.8 Hold   9/22 3.3 Hold  9/23 3.5 Hold  9/24 3.2 Hold  9/25 3.5 Hold  9/26 3.0 Hold  9/27 3.0 Hold  9/28 3.0 Hold  9/29 2.9 0.5 mg (ordered)     Plan:  INR therapeutic at 2.9 INR trend potentially due to poor diet, (albumin 1.9 on 9/26), and acute illness. Dysphagia 1 diet Given INR at high end of goal and previous INR up trend after 1 mg given on 9/20, will give 0.5 mg (1/2 home dose) in hopes to prevent patient from going subtherapeutic from multiple held doses Daily INR while inpatient and CBC at least every 72 hours  Lum VEAR Mania, PharmD Clinical Pharmacist 12/19/2023 7:21 AM

## 2023-12-19 NOTE — Progress Notes (Addendum)
 Progress Note    [name]  FMW:[id number] DOB: [birth year]  DOA: 12/08/2023 PCP: Sadie Manna, MD      Brief Narrative:    Medical records reviewed and are as summarized below:  Cassandra Weiss is a 89 y.o. female  with medical history significant of atrial fibrillation, hypertension, urge incontinence, chronic indwelling Foley catheter, hyperlipidemia coronary artery disease, history of stroke, iron deficiency anemia who was brought to the ER with altered mental status (less responsive) and hypotension.   Vital signs in the ED: Temperature 96.3 F and 95.6 F (per rectum), respiratory rate 24, pulse 85, BP 116/64 and dropped to 93/42, O2 sat 98% on room air.  Diagnostic data significant for sodium 156, BUN 50, creatinine 1.71, albumin 2.7, WBC 8.5, hemoglobin 9.3, platelet count 334, INR 8.8, urinalysis positive for UTI.  Chest x-ray and CT head did not show any acute abnormality.  CT renal stone: IMPRESSION: 1. Trace to small left and trace right pleural effusions. 2. Cardiomegaly. 3. Small hiatal hernia. 4. Nonobstructive 4 mm left nephrolithiasis. 5. Marked L5-S1 disc bulge. 6. Otherwise limited evaluation on this noncontrast study. 7.  Aortic Atherosclerosis (ICD10-I70.0).    She was admitted to the hospital for hypernatremia, Coumadin  coagulopathy with supratherapeutic INR, sepsis secondary to acute UTI.       Assessment/Plan:   Principal Problem:   Sepsis secondary to UTI [facility]) Active Problems:   Acute urinary tract infection   MSSA bacteremia   AKI (acute kidney injury)   Hypernatremia   Acute metabolic encephalopathy   Benign essential HTN   Paroxysmal A-fib (HCC)   Hypercoagulable state   Coronary artery disease   Iron deficiency anemia   Hyperlipidemia   Urge urinary incontinence   Altered mental status   AMS (altered mental status)   Hypokalemia   Pyogenic arthritis of right shoulder region [facility])   Nutrition Problem: Increased  nutrient needs Etiology: wound healing  Signs/Symptoms: estimated needs   Body mass index is 24.07 kg/m.   Sepsis secondary to acute UTI, MSSA bacteremia: Urine culture showed pansensitive E. coli.  Blood culture showed MSSA.  No growth on repeat blood cultures from 12/11/2022. 2D echo showed preserved EF. S/p TEE on 12/14/2023 did not show any vegetations. She was evaluated by Dr. Edie, orthopedic surgeon. CT right shoulder showed severe glenohumeral degenerative changes with joint effusion and probable small intra-articular loose bodies. S/p aspiration of right shoulder joint on 12/16/2023.  Synovial fluid culture did not show any growth. Continue IV cefazolin . Plan discussed with Dr. Fayette, ID specialist.  Patient will be given 1 dose of IV dalbavancin prior to discharge.   AKI on CKD stage IIIa: Improved Hypernatremia: Improved Hypokalemia: Improved   Vomiting: Antiemetics as needed. She is having bowel movements.   Acute metabolic encephalopathy: Improved but suspect patient has baseline cognitive impairment.   Paroxysmal atrial fibrillation, history of stroke: Warfarin on hold Warfarin coagulopathy: Improved.  INR down to 3.0.  Continue to monitor INR and adjust Coumadin  accordingly.   INR went up as high as 10.1.   Black stools: Improved.   H&H stable.  Hemoglobin up from 8.1-10.0   Comorbidities include CAD, severe asymmetric LVH of septal segment on 2D echo, severe hypoalbuminemia, right pleural effusion, hyperlipidemia, urge incontinence, chronic indwelling Foley catheter (replaced on day of admission, 12/08/2023)   General Weakness, deconditioning: PT recommended discharge to SNF.  Unfortunately, [name], son, said nursing home wants him to pay some money upfront and he can't  do that.  He has decided to take patient home.   Pain in lower extremities: Improved.  Venous duplex of lower extremities negative for DVT.  X-ray right knee showed mild arthritis of  right knee.  X-ray right foot did not show any acute osseous abnormality.   Plan discussed with [name], son, over the phone.  He plans to take patient home tomorrow.  He wants to make arrangements such as getting a home health aide to prepare for patient's discharge.  He was concerned that patient has not been eating much and he said he does not know whether she has given up.  Case discussed with Corean, case Production designer, theatre/television/film.  Plan for discharge to home tomorrow.   Diet Order             DIET DYS 2 Room service appropriate? Yes with Assist; Fluid consistency: Thin  Diet effective now                                  Consultants: Cardiologist ID specialist Orthopedic surgery Interventional radiologist  Procedures: TEE on 12/14/2023 S/p aspiration of right shoulder joint on 12/16/2023.    Medications:    amLODipine   5 mg Oral Daily   vitamin C   500 mg Oral BID   atorvastatin   80 mg Oral Daily   carvedilol   25 mg Oral BID   Chlorhexidine  Gluconate Cloth  6 each Topical Daily   feeding supplement  237 mL Oral TID BM   gabapentin   300 mg Oral TID   hydrALAZINE   50 mg Oral TID   lidocaine   1 patch Transdermal Q24H   multivitamin with minerals  1 tablet Oral Daily   sodium chloride  flush  10-40 mL Intracatheter Q12H   warfarin  0.5 mg Oral ONCE-1600   Warfarin - Pharmacist Dosing Inpatient   Does not apply q1600   zinc  sulfate (50mg  elemental zinc )  220 mg Oral Daily   Continuous Infusions:   ceFAZolin  (ANCEF ) IV 2 g (12/19/23 0858)     Anti-infectives (From admission, onward)    Start     Dose/Rate Route Frequency Ordered Stop   12/15/23 2200  ceFAZolin  (ANCEF ) IVPB 2g/100 mL premix        2 g 200 mL/hr over 30 Minutes Intravenous Every 12 hours 12/15/23 0845     12/09/23 2200  levofloxacin  (LEVAQUIN ) IVPB 500 mg  Status:  Discontinued        500 mg 100 mL/hr over 60 Minutes Intravenous Every 24 hours 12/09/23 2123 12/11/23 1329   12/09/23 1800   cefTRIAXone  (ROCEPHIN ) 2 g in sodium chloride  0.9 % 100 mL IVPB  Status:  Discontinued        2 g 200 mL/hr over 30 Minutes Intravenous Every 24 hours 12/08/23 1907 12/09/23 1626   12/09/23 1800  ceFAZolin  (ANCEF ) IVPB 2g/100 mL premix  Status:  Discontinued        2 g 200 mL/hr over 30 Minutes Intravenous Every 8 hours 12/09/23 1626 12/15/23 0845   12/08/23 1745  cefTRIAXone  (ROCEPHIN ) 2 g in sodium chloride  0.9 % 100 mL IVPB        2 g 200 mL/hr over 30 Minutes Intravenous Once 12/08/23 1730 12/08/23 1821              Family Communication/Anticipated D/C date and plan/Code Status   DVT prophylaxis:  warfarin (COUMADIN ) tablet 0.5 mg     Code Status: Do  not attempt resuscitation (DNR) PRE-ARREST INTERVENTIONS DESIRED  Family Communication: Plan discussed with son over the phone.  Plan discussed with husband at the bedside  Disposition Plan: Plan to discharge to SNF   Status is: Inpatient Remains inpatient appropriate because: MSSA bacteremia       Subjective:   Interval events noted.  No complaints.  Pain in the legs is better.  Objective:    Vitals:   12/18/23 1502 12/18/23 1940 12/19/23 0448 12/19/23 0902  BP: (!) 109/47 126/62 (!) 144/68 122/66  Pulse: 81 70 86 75  Resp: 16 16 16 18   Temp: 97.7 F (36.5 C) 98 F (36.7 C) 97.8 F (36.6 C) 97.6 F (36.4 C)  TempSrc: Oral Oral Oral   SpO2: 96% 99% 100% 94%  Weight:       No data found.   Intake/Output Summary (Last 24 hours) at 12/19/2023 1546 Last data filed at 12/18/2023 2146 Gross per 24 hour  Intake 10 ml  Output --  Net 10 ml    Filed Weights   12/11/23 1101  Weight: 59.7 kg    Exam:  GEN: NAD SKIN: Warm and dry EYES: No pallor or icterus ENT: MMM CV: RRR PULM: CTA B ABD: soft, ND, NT, +BS CNS: AAO x 1 (person), non focal EXT: No edema or tenderness      Data Reviewed:   I have personally reviewed following labs and imaging studies:  Labs: Labs show the following:    Basic Metabolic Panel: Recent Labs  Lab 12/13/23 0522 12/14/23 0632 12/15/23 0518 12/16/23 0630 12/17/23 0900  NA 142 143 141 142 142  K 3.6 3.3* 3.4* 3.7 3.9  CL 108 107 106 108 110  CO2 24 24 24 24  20*  GLUCOSE 81 99 72 82 75  BUN 12 11 13 16 16   CREATININE 1.03* 1.05* 1.14* 1.29* 1.16*  CALCIUM  8.4* 8.6* 8.2* 8.4* 8.4*  MG  --   --   --  2.1  --   PHOS  --   --   --  2.7 2.5   GFR Estimated Creatinine Clearance: 26 mL/min (A) (by C-G formula based on SCr of 1.16 mg/dL (H)). Liver Function Tests: Recent Labs  Lab 12/16/23 0630 12/17/23 0900  ALBUMIN 1.9* 1.9*   No results for input(s): LIPASE, AMYLASE in the last 168 hours.  No results for input(s): AMMONIA in the last 168 hours. Coagulation profile Recent Labs  Lab 12/15/23 0518 12/16/23 0630 12/17/23 0514 12/18/23 1300 12/19/23 0313  INR 3.5* 3.0* 3.0* 3.0* 2.9*    CBC: Recent Labs  Lab 12/13/23 0522 12/14/23 0632 12/16/23 0630 12/17/23 1021  WBC 8.5 8.3 9.0 8.3  HGB 9.9* 9.1* 8.1* 10.0*  HCT 31.6* 28.8* 26.0* 31.4*  MCV 88.0 87.8 88.7 87.5  PLT 248 217 241 267   Cardiac Enzymes: No results for input(s): CKTOTAL, CKMB, CKMBINDEX, TROPONINI in the last 168 hours. BNP (last 3 results) No results for input(s): PROBNP in the last 8760 hours. CBG: Recent Labs  Lab 12/19/23 1325  GLUCAP 96    D-Dimer: No results for input(s): DDIMER in the last 72 hours. Hgb A1c: No results for input(s): HGBA1C in the last 72 hours. Lipid Profile: No results for input(s): CHOL, HDL, LDLCALC, TRIG, CHOLHDL, LDLDIRECT in the last 72 hours. Thyroid  function studies: No results for input(s): TSH, T4TOTAL, T3FREE, THYROIDAB in the last 72 hours.  Invalid input(s): FREET3 Anemia work up: No results for input(s): VITAMINB12, FOLATE, FERRITIN, TIBC, IRON, RETICCTPCT in  the last 72 hours. Sepsis Labs: Recent Labs  Lab 12/13/23 0522 12/14/23 0632  12/16/23 0630 12/17/23 1021  WBC 8.5 8.3 9.0 8.3    Microbiology Recent Results (from the past 240 hours)  Culture, blood (Routine X 2) w Reflex to ID Panel     Status: None   Collection Time: 12/11/23  6:38 AM   Specimen: BLOOD  Result Value Ref Range Status   Specimen Description BLOOD LEFT ANTECUBITAL  Final   Special Requests   Final    BOTTLES DRAWN AEROBIC AND ANAEROBIC Blood Culture results may not be optimal due to an inadequate volume of blood received in culture bottles   Culture   Final    NO GROWTH 5 DAYS Performed at [facility], [address] [place], [place] [address]    Report Status 12/16/2023 FINAL  Final  Culture, blood (Routine X 2) w Reflex to ID Panel     Status: None   Collection Time: 12/11/23  6:38 AM   Specimen: BLOOD LEFT HAND  Result Value Ref Range Status   Specimen Description BLOOD LEFT HAND  Final   Special Requests   Final    BOTTLES DRAWN AEROBIC AND ANAEROBIC Blood Culture adequate volume   Culture   Final    NO GROWTH 5 DAYS Performed at [facility], [address] [place], [place] [address]    Report Status 12/16/2023 FINAL  Final  Aerobic/Anaerobic Culture w Gram Stain (surgical/deep wound)     Status: None (Preliminary result)   Collection Time: 12/16/23  4:36 PM   Specimen: Wound; Synovial Fluid  Result Value Ref Range Status   Specimen Description   Final    WOUND Performed at [facility], [address], [place], [place] [address]    Special Requests   Final    NONE Performed at [facility], [address] [place], [place] [address]    Gram Stain NO WBC SEEN NO ORGANISMS SEEN   Final   Culture   Final    NO GROWTH 3 DAYS NO ANAEROBES ISOLATED; CULTURE IN PROGRESS FOR 5 DAYS Performed at [facility], [address] [place], [place] [address]    Report Status PENDING  Incomplete    Procedures and diagnostic studies:  DG Foot 2 Views Right Result Date:  12/18/2023 CLINICAL DATA:  Right foot pain. EXAM: RIGHT FOOT - 2 VIEW COMPARISON:  None Available. FINDINGS: There is no evidence of fracture or dislocation. Hammertoe deformity of the toes. No erosions or bone destruction. Mild soft tissue edema. No soft tissue gas. No radiopaque foreign body. IMPRESSION: 1. Mild soft tissue edema. No acute osseous abnormality. 2. Hammertoe deformity of the toes. Electronically Signed   By: Andrea Gasman M.D.   On: 12/18/2023 14:04   DG Knee 1-2 Views Right Result Date: 12/18/2023 CLINICAL DATA:  Right knee pain. EXAM: RIGHT KNEE - 1-2 VIEW COMPARISON:  None Available. FINDINGS: No evidence of fracture, dislocation, or joint effusion. Mild tibiofemoral joint space narrowing and peripheral spurring. No erosions or focal bone abnormality. Vascular calcifications. Soft tissues are otherwise unremarkable. IMPRESSION: Mild osteoarthritis. No acute findings. Electronically Signed   By: Andrea Gasman M.D.   On: 12/18/2023 14:03   US  Venous Img Lower Bilateral (DVT) Result Date: 12/18/2023 CLINICAL DATA:  [id number] Leg pain, bilateral [id number] EXAM: BILATERAL LOWER EXTREMITY VENOUS DOPPLER ULTRASOUND TECHNIQUE: Gray-scale sonography with graded compression, as well as color Doppler and duplex ultrasound were performed to evaluate  the lower extremity deep venous systems from the level of the common femoral vein and including the common femoral, femoral, profunda femoral, popliteal and calf veins including the posterior tibial, peroneal and gastrocnemius veins when visible. The superficial great saphenous vein was also interrogated. Spectral Doppler was utilized to evaluate flow at rest and with distal augmentation maneuvers in the common femoral, femoral and popliteal veins. COMPARISON:  CT AP, 12/08/2023. FINDINGS: RIGHT LOWER EXTREMITY VENOUS Normal compressibility of the RIGHT common femoral, superficial femoral, and popliteal veins, as well as the visualized calf veins.  Visualized portions of profunda femoral vein and great saphenous vein unremarkable. No filling defects to suggest DVT on grayscale or color Doppler imaging. Doppler waveforms show normal direction of venous flow, normal respiratory plasticity and response to augmentation. OTHER No evidence of superficial thrombophlebitis or abnormal fluid collection. Limitations: none LEFT LOWER EXTREMITY VENOUS Normal compressibility of the LEFT common femoral, superficial femoral, and popliteal veins, as well as the visualized calf veins. Visualized portions of profunda femoral vein and great saphenous vein unremarkable. No filling defects to suggest DVT on grayscale or color Doppler imaging. Doppler waveforms show normal direction of venous flow, normal respiratory plasticity and response to augmentation. OTHER No evidence of superficial thrombophlebitis or abnormal fluid collection. Limitations: none IMPRESSION: No evidence of femoropopliteal DVT or superficial thrombophlebitis within either lower extremity. Thom Hall, MD [facility]iology Electronically Signed   By: Thom Hall M.D.   On: 12/18/2023 07:45               LOS: 11 days   Garyson Stelly  [facility]   Pager on [email or web address]. If 7PM-7AM, please contact night-coverage at [email or web address]     12/19/2023, 3:46 PM

## 2023-12-19 NOTE — Progress Notes (Signed)
 Date of Admission:  12/08/2023      ID: Cassandra Weiss is a 89 y.o. female Principal Problem:   Sepsis secondary to UTI [facility]) Active Problems:   Altered mental status   AKI (acute kidney injury)   AMS (altered mental status)   Acute urinary tract infection   Benign essential HTN   Coronary artery disease   Hyperlipidemia   Paroxysmal A-fib (HCC)   Iron deficiency anemia   Hypernatremia   MSSA bacteremia   Acute metabolic encephalopathy   Hypercoagulable state   Urge urinary incontinence   Hypokalemia   Pyogenic arthritis of right shoulder region (HCC)    Subjective: sleeping  Medications:   amLODipine   5 mg Oral Daily   vitamin C   500 mg Oral BID   atorvastatin   80 mg Oral Daily   carvedilol   25 mg Oral BID   Chlorhexidine  Gluconate Cloth  6 each Topical Daily   feeding supplement  237 mL Oral TID BM   gabapentin   300 mg Oral TID   hydrALAZINE   50 mg Oral TID   lidocaine   1 patch Transdermal Q24H   multivitamin with minerals  1 tablet Oral Daily   sodium chloride  flush  10-40 mL Intracatheter Q12H   warfarin  0.5 mg Oral ONCE-1600   Warfarin - Pharmacist Dosing Inpatient   Does not apply q1600   zinc  sulfate (50mg  elemental zinc )  220 mg Oral Daily    Objective: Vital signs in last 24 hours: Patient Vitals for the past 24 hrs:  BP Temp Temp src Pulse Resp SpO2  12/19/23 0902 122/66 97.6 F (36.4 C) -- 75 18 94 %  12/19/23 0448 (!) 144/68 97.8 F (36.6 C) Oral 86 16 100 %  12/18/23 1940 126/62 98 F (36.7 C) Oral 70 16 99 %  12/18/23 1502 (!) 109/47 97.7 F (36.5 C) Oral 81 16 96 %     PHYSICAL EXAM:  General: sleeping Restricted movt rt shoulder Swelling of rt arm Lungs: b/l air entry Heart: Regular rate and rhythm, no murmur, rub or gallop. Abdomen: Soft, non-tender,not distended. Bowel sounds normal. No masses Skin: left heel eschar  Neurologic: not examined  Lab Results    Latest Ref Rng & Units 12/17/2023   10:21 AM 12/16/2023    6:30 AM  12/14/2023    6:32 AM  CBC  WBC 4.0 - 10.5 K/uL 8.3  9.0  8.3   Hemoglobin 12.0 - 15.0 g/dL 89.9  8.1  9.1   Hematocrit 36.0 - 46.0 % 31.4  26.0  28.8   Platelets 150 - 400 K/uL 267  241  217        Latest Ref Rng & Units 12/17/2023    9:00 AM 12/16/2023    6:30 AM 12/15/2023    5:18 AM  CMP  Glucose 70 - 99 mg/dL 75  82  72   BUN 8 - 23 mg/dL 16  16  13    Creatinine 0.44 - 1.00 mg/dL 8.83  8.70  8.85   Sodium 135 - 145 mmol/L 142  142  141   Potassium 3.5 - 5.1 mmol/L 3.9  3.7  3.4   Chloride 98 - 111 mmol/L 110  108  106   CO2 22 - 32 mmol/L 20  24  24    Calcium  8.9 - 10.3 mg/dL 8.4  8.4  8.2       Microbiology: 9/18 1 set  MSSA Repeat [facility] 9/21 NG UC Ecoli  Rt shoulder  joint aspiration not enough fluid Cell count could not be done Culture pending  Studies/Results: DG Foot 2 Views Right Result Date: 12/18/2023 CLINICAL DATA:  Right foot pain. EXAM: RIGHT FOOT - 2 VIEW COMPARISON:  None Available. FINDINGS: There is no evidence of fracture or dislocation. Hammertoe deformity of the toes. No erosions or bone destruction. Mild soft tissue edema. No soft tissue gas. No radiopaque foreign body. IMPRESSION: 1. Mild soft tissue edema. No acute osseous abnormality. 2. Hammertoe deformity of the toes. Electronically Signed   By: Andrea Gasman M.D.   On: 12/18/2023 14:04   DG Knee 1-2 Views Right Result Date: 12/18/2023 CLINICAL DATA:  Right knee pain. EXAM: RIGHT KNEE - 1-2 VIEW COMPARISON:  None Available. FINDINGS: No evidence of fracture, dislocation, or joint effusion. Mild tibiofemoral joint space narrowing and peripheral spurring. No erosions or focal bone abnormality. Vascular calcifications. Soft tissues are otherwise unremarkable. IMPRESSION: Mild osteoarthritis. No acute findings. Electronically Signed   By: Andrea Gasman M.D.   On: 12/18/2023 14:03   US  Venous Img Lower Bilateral (DVT) Result Date: 12/18/2023 CLINICAL DATA:  [id number] Leg pain, bilateral [id number] EXAM:  BILATERAL LOWER EXTREMITY VENOUS DOPPLER ULTRASOUND TECHNIQUE: Gray-scale sonography with graded compression, as well as color Doppler and duplex ultrasound were performed to evaluate the lower extremity deep venous systems from the level of the common femoral vein and including the common femoral, femoral, profunda femoral, popliteal and calf veins including the posterior tibial, peroneal and gastrocnemius veins when visible. The superficial great saphenous vein was also interrogated. Spectral Doppler was utilized to evaluate flow at rest and with distal augmentation maneuvers in the common femoral, femoral and popliteal veins. COMPARISON:  CT AP, 12/08/2023. FINDINGS: RIGHT LOWER EXTREMITY VENOUS Normal compressibility of the RIGHT common femoral, superficial femoral, and popliteal veins, as well as the visualized calf veins. Visualized portions of profunda femoral vein and great saphenous vein unremarkable. No filling defects to suggest DVT on grayscale or color Doppler imaging. Doppler waveforms show normal direction of venous flow, normal respiratory plasticity and response to augmentation. OTHER No evidence of superficial thrombophlebitis or abnormal fluid collection. Limitations: none LEFT LOWER EXTREMITY VENOUS Normal compressibility of the LEFT common femoral, superficial femoral, and popliteal veins, as well as the visualized calf veins. Visualized portions of profunda femoral vein and great saphenous vein unremarkable. No filling defects to suggest DVT on grayscale or color Doppler imaging. Doppler waveforms show normal direction of venous flow, normal respiratory plasticity and response to augmentation. OTHER No evidence of superficial thrombophlebitis or abnormal fluid collection. Limitations: none IMPRESSION: No evidence of femoropopliteal DVT or superficial thrombophlebitis within either lower extremity. Thom Hall, MD [facility] Radiology  Electronically Signed   By: Thom Hall M.D.   On: 12/18/2023 07:45      Assessment/Plan: Acute metabolic encephalopathy due to dehydration, hypernatremia and infection- improved but not back to baseline ? Hypotension- resolved  sepsis /dehydration      MSSA bacteremia 1/4- s low bioburden she has some chronic skin wounds- left heel eschar- dry  And superficial sacral decub On cefazolin  day 9   Severe degenerative rt shoulder arthritis Xray shoulder repeated on 9/20 which shows worsening degeneration- discuss with ortho to see whether aspiration is  needed to r/o septic arthritis  Though clinically less likely IR tried to aspirate on 12/16/23  but did not get much fluid Culture sent-cell count could not be done as not enough fluid On cefazolin  TEEno vegetation Rt shoulder joint  culture NG So she can be discharged from ID perspective after 1 dose of dalbavancin     Complicated UTI due to foley foley changed in ED Ecoli being treated with Iv cefazolin     Hypernatremia- secondary to dehydration/poor intake- resolved   AKI - improving   Anemia ( r/o bleed)    On coumadin  high INR Received Vitamin K  improved    Discussed with hospitalist and ID pharmacist  ID will sign off- call if needed

## 2023-12-19 NOTE — Plan of Care (Signed)
  Problem: Fluid Volume: Goal: Hemodynamic stability will improve Outcome: Progressing   Problem: Clinical Measurements: Goal: Diagnostic test results will improve Outcome: Progressing Goal: Signs and symptoms of infection will decrease Outcome: Progressing   Problem: Respiratory: Goal: Ability to maintain adequate ventilation will improve Outcome: Progressing   Problem: Education: Goal: Knowledge of General Education information will improve Description: Including pain rating scale, medication(s)/side effects and non-pharmacologic comfort measures Outcome: Progressing   Problem: Health Behavior/Discharge Planning: Goal: Ability to manage health-related needs will improve Outcome: Progressing   Problem: Clinical Measurements: Goal: Ability to maintain clinical measurements within normal limits will improve Outcome: Progressing Goal: Will remain free from infection Outcome: Progressing Goal: Diagnostic test results will improve Outcome: Progressing Goal: Respiratory complications will improve Outcome: Progressing Goal: Cardiovascular complication will be avoided Outcome: Progressing   Problem: Activity: Goal: Risk for activity intolerance will decrease Outcome: Progressing   Problem: Nutrition: Goal: Adequate nutrition will be maintained Outcome: Progressing   Problem: Coping: Goal: Level of anxiety will decrease Outcome: Progressing   Problem: Elimination: Goal: Will not experience complications related to bowel motility Outcome: Progressing Goal: Will not experience complications related to urinary retention Outcome: Progressing   Problem: Pain Managment: Goal: General experience of comfort will improve and/or be controlled Outcome: Progressing   Problem: Safety: Goal: Ability to remain free from injury will improve Outcome: Progressing   Problem: Skin Integrity: Goal: Risk for impaired skin integrity will decrease Outcome: Progressing   Problem:  Safety: Goal: Non-violent Restraint(s) Outcome: Progressing   Problem: Clinical Measurements: Goal: Diagnostic test results will improve Outcome: Progressing   Problem: Clinical Measurements: Goal: Signs and symptoms of infection will decrease Outcome: Progressing   Problem: Respiratory: Goal: Ability to maintain adequate ventilation will improve Outcome: Progressing   Problem: Education: Goal: Knowledge of General Education information will improve Description: Including pain rating scale, medication(s)/side effects and non-pharmacologic comfort measures Outcome: Progressing   Problem: Clinical Measurements: Goal: Will remain free from infection Outcome: Progressing   Problem: Activity: Goal: Risk for activity intolerance will decrease Outcome: Progressing

## 2023-12-20 DIAGNOSIS — A419 Sepsis, unspecified organism: Secondary | ICD-10-CM | POA: Diagnosis not present

## 2023-12-20 DIAGNOSIS — N390 Urinary tract infection, site not specified: Secondary | ICD-10-CM | POA: Diagnosis not present

## 2023-12-20 LAB — PROTIME-INR
INR: 2.3 — ABNORMAL HIGH (ref 0.8–1.2)
Prothrombin Time: 26.5 s — ABNORMAL HIGH (ref 11.4–15.2)

## 2023-12-20 MED ORDER — WARFARIN SODIUM 1 MG PO TABS
1.0000 mg | ORAL_TABLET | Freq: Once | ORAL | Status: DC
  Filled 2023-12-20: qty 1

## 2023-12-20 NOTE — Progress Notes (Signed)
   12/13/23 1139  Wound 12/13/23 0718 Pressure Injury Sacrum Medial Stage 2 -  Partial thickness loss of dermis presenting as a shallow open injury with a red, pink wound bed without slough.  Date First Assessed/Time First Assessed: 12/13/23 0718   Present on Original Admission: Yes  Primary Wound Type: Pressure Injury  Location: Sacrum  Location Orientation: Medial  Staging: Stage 2 -  Partial thickness loss of dermis presenting as a shal...  Wound Image   Site / Wound Assessment Clean;Dry  Wound Length (cm) 1.2 cm  Wound Width (cm) 0.8 cm  Wound Surface Area (cm^2) 0.75 cm^2  Wound Depth (cm) 0.1 cm  Wound Volume (cm^3) 0.05 cm^3  Drainage Description No odor  Drainage Amount None  Treatment Cleansed;Other (Comment) (Foam in place and Q2 position changes)  Dressing Type Foam - Lift dressing to assess site every shift  Dressing Changed Changed

## 2023-12-20 NOTE — Consult Note (Signed)
 PHARMACY - ANTICOAGULATION CONSULT NOTE  Pharmacy Consult for warfarin  Indication: Afib   Allergies  Allergen Reactions   Penicillins Rash    Has patient had a PCN reaction causing immediate rash, facial/tongue/throat swelling, SOB or lightheadedness with hypotension: Unknown Has patient had a PCN reaction causing severe rash involving mucus membranes or skin necrosis: Unknown Has patient had a PCN reaction that required hospitalization: Unknown Has patient had a PCN reaction occurring within the last 10 years: Unknown If all of the above answers are NO, then may proceed with Cephalosporin use.     Patient Measurements: Weight: 59.7 kg (131 lb 9.8 oz)  Vital Signs: Temp: 97.8 F (36.6 C) (09/30 0435) Temp Source: Oral (09/29 1928) BP: 112/46 (09/30 0435) Pulse Rate: 72 (09/30 0435)  Labs: Recent Labs    12/17/23 0900 12/17/23 1021 12/18/23 1300 12/19/23 0313 12/20/23 0412  HGB  --  10.0*  --   --   --   HCT  --  31.4*  --   --   --   PLT  --  267  --   --   --   LABPROT  --   --  32.5* 31.6* 26.5*  INR  --   --  3.0* 2.9* 2.3*  CREATININE 1.16*  --   --   --   --     Estimated Creatinine Clearance: 26 mL/min (A) (by C-G formula based on SCr of 1.16 mg/dL (H)).   Medical History: Past Medical History:  Diagnosis Date   Arthritis    Atrial fibrillation (HCC)    Hypertension    Urge urinary incontinence     Medications:  Reported home regimen is Warfarin 1 mg po daily with last dose 12/08/23  Assessment: 89 yo female with a PMH of A.fib, HTN, and CAD presented to ED due to altered mental status. In ED patient found to have supratherapeutic INR.  Pharmacy consulted to manage warfarin dosing.  DDI: Ancef  (increase INR) Diet: NPO, last Ensure given on 9/23  Goal of Therapy:  INR 2-3 Monitor platelets by anticoagulation protocol: Yes   Date INR Warfarin Dose  9/18 8.8 1 mg (PTA)  9/19 10.1 Hold. IV vitamin K  given.   9/20 1.7 1 mg  9/21 2.8 Hold   9/22 3.3 Hold  9/23 3.5 Hold  9/24 3.2 Hold  9/25 3.5 Hold  9/26 3.0 Hold  9/27 3.0 Hold  9/28 3.0 Hold  9/29 2.9 0.5 mg   9/30 2.3 1 mg     Plan:  INR therapeutic at 2.3 but trending down, likely due to multiple held doses Will give warfarin 1 mg PO x 1  (home dose)  Daily INR while inpatient and CBC at least every 72 hours  Lum VEAR Mania, PharmD Clinical Pharmacist 12/20/2023 7:27 AM

## 2023-12-20 NOTE — Plan of Care (Signed)
 Pt refused all po meds tonight.  Problem: Fluid Volume: Goal: Hemodynamic stability will improve Outcome: Not Progressing   Problem: Clinical Measurements: Goal: Diagnostic test results will improve Outcome: Not Progressing Goal: Signs and symptoms of infection will decrease Outcome: Not Progressing   Problem: Respiratory: Goal: Ability to maintain adequate ventilation will improve Outcome: Not Progressing   Problem: Education: Goal: Knowledge of General Education information will improve Description: Including pain rating scale, medication(s)/side effects and non-pharmacologic comfort measures Outcome: Not Progressing   Problem: Health Behavior/Discharge Planning: Goal: Ability to manage health-related needs will improve Outcome: Not Progressing   Problem: Clinical Measurements: Goal: Ability to maintain clinical measurements within normal limits will improve Outcome: Not Progressing Goal: Will remain free from infection Outcome: Not Progressing Goal: Diagnostic test results will improve Outcome: Not Progressing Goal: Respiratory complications will improve Outcome: Not Progressing Goal: Cardiovascular complication will be avoided Outcome: Not Progressing   Problem: Activity: Goal: Risk for activity intolerance will decrease Outcome: Not Progressing   Problem: Nutrition: Goal: Adequate nutrition will be maintained Outcome: Not Progressing   Problem: Coping: Goal: Level of anxiety will decrease Outcome: Not Progressing   Problem: Elimination: Goal: Will not experience complications related to bowel motility Outcome: Not Progressing Goal: Will not experience complications related to urinary retention Outcome: Not Progressing   Problem: Pain Managment: Goal: General experience of comfort will improve and/or be controlled Outcome: Not Progressing   Problem: Safety: Goal: Ability to remain free from injury will improve Outcome: Not Progressing   Problem: Skin  Integrity: Goal: Risk for impaired skin integrity will decrease Outcome: Not Progressing   Problem: Safety: Goal: Non-violent Restraint(s) Outcome: Not Progressing

## 2023-12-20 NOTE — Discharge Summary (Addendum)
 Physician Discharge Summary   Patient: Cassandra Weiss MRN: [id number] DOB: 02/14/1933  Admit date:     12/08/2023  Discharge date: 12/20/23  Discharge Physician: AIDA CHO   PCP: Sadie Manna, MD   Recommendations at discharge:   Follow-up with PCP within 1 week of discharge Check INR on 12/24/2023 and follow-up with PCP for recommendations on warfarin dosing.  Discharge Diagnoses: Principal Problem:   Sepsis secondary to UTI [facility]) Active Problems:   Acute urinary tract infection   MSSA bacteremia   AKI (acute kidney injury)   Hypernatremia   Acute metabolic encephalopathy   Benign essential HTN   Paroxysmal A-fib (HCC)   Hypercoagulable state   Coronary artery disease   Iron deficiency anemia   Hyperlipidemia   Urge urinary incontinence   Altered mental status   AMS (altered mental status)   Hypokalemia  Resolved Problems:   * No resolved hospital problems. *  Hospital Course:  Cassandra Weiss is a 89 y.o. female  with medical history significant of atrial fibrillation, hypertension, urge incontinence, chronic indwelling Foley catheter, hyperlipidemia coronary artery disease, history of stroke, iron deficiency anemia who was brought to the ER with altered mental status (less responsive) and hypotension.    Vital signs in the ED: Temperature 96.3 F and 95.6 F (per rectum), respiratory rate 24, pulse 85, BP 116/64 and dropped to 93/42, O2 sat 98% on room air.   Diagnostic data significant for sodium 156, BUN 50, creatinine 1.71, albumin 2.7, WBC 8.5, hemoglobin 9.3, platelet count 334, INR 8.8, urinalysis positive for UTI.  Chest x-ray and CT head did not show any acute abnormality.   CT renal stone: IMPRESSION: 1. Trace to small left and trace right pleural effusions. 2. Cardiomegaly. 3. Small hiatal hernia. 4. Nonobstructive 4 mm left nephrolithiasis. 5. Marked L5-S1 disc bulge. 6. Otherwise limited evaluation on this noncontrast study. 7.  Aortic  Atherosclerosis (ICD10-I70.0).       She was admitted to the hospital for hypernatremia, Coumadin  coagulopathy with supratherapeutic INR, sepsis secondary to acute UTI.    Assessment and Plan:   Sepsis secondary to acute UTI, MSSA bacteremia: Urine culture showed pansensitive E. coli.  Blood culture showed MSSA.  No growth on repeat blood cultures from 12/11/2022. 2D echo showed preserved EF. S/p TEE on 12/14/2023 did not show any vegetations. She was evaluated by Dr. Edie, orthopedic surgeon. CT right shoulder showed severe glenohumeral degenerative changes with joint effusion and probable small intra-articular loose bodies. S/p aspiration of right shoulder joint on 12/16/2023.  Synovial fluid culture did not show any growth. Completed about 12 days of IV cefazolin . S/p IV dalbavancin on 12/20/2023.     AKI on CKD stage IIIa: Improved Hypernatremia: Improved Hypokalemia: Improved     Vomiting: Antiemetics as needed. She is having bowel movements.     Acute metabolic encephalopathy: Improved but suspect patient has baseline cognitive impairment.     Paroxysmal atrial fibrillation, history of stroke: Resume warfarin at discharge. Warfarin coagulopathy: Improved.  INR down to 2.3.   INR was as high as 10.1 on admission Close monitoring of INR strongly recommended with appropriate adjustment of warfarin as needed.     Black stools: Improved.   H&H stable.  Hemoglobin up from 8.1-10.0     Comorbidities include CAD, severe asymmetric LVH of septal segment on 2D echo, severe hypoalbuminemia, right pleural effusion, hyperlipidemia, urge incontinence, chronic indwelling Foley catheter (replaced on day of admission, 12/08/2023)     General Weakness, deconditioning:  PT recommended discharge to SNF.  Unfortunately, [name], son, said nursing home wants him to pay some money upfront and he can't do that.  He has decided to take patient home.     Pain in lower extremities: Improved.   Venous duplex of lower extremities negative for DVT.  X-ray right knee showed mild arthritis of right knee.  X-ray right foot did not show any acute osseous abnormality.     Patient is chronically ill with multiple comorbidities and is at risk for readmission. Home health PT, OT, RN and home health aide have been ordered   She is deemed to be medically stable for discharge.           Consultants: ID specialist, orthopedic surgeon, cardiologist, interventional radiologist Procedures performed:  TEE on 12/14/2023 S/p aspiration of right shoulder joint on 12/16/2023.  Disposition: Home health Diet recommendation:  Discharge Diet Orders (From admission, onward)     Start     Ordered   12/20/23 0000  DIET DYS 2       Question:  Fluid consistency:  Answer:  Thin   12/20/23 1057           Dysphagia type 2 thin Liquid DISCHARGE MEDICATION: Allergies as of 12/20/2023       Reactions   Penicillins Rash   Has patient had a PCN reaction causing immediate rash, facial/tongue/throat swelling, SOB or lightheadedness with hypotension: Unknown Has patient had a PCN reaction causing severe rash involving mucus membranes or skin necrosis: Unknown Has patient had a PCN reaction that required hospitalization: Unknown Has patient had a PCN reaction occurring within the last 10 years: Unknown If all of the above answers are NO, then may proceed with Cephalosporin use.        Medication List     STOP taking these medications    linezolid 600 MG tablet Commonly known as: ZYVOX       TAKE these medications    acetaminophen  325 MG tablet Commonly known as: TYLENOL  Take 2 tablets (650 mg total) by mouth every 6 (six) hours as needed for mild pain (pain score 1-3) or fever (or Fever >/= 101).   ALPRAZolam  0.5 MG tablet Commonly known as: XANAX  Take 0.5 mg by mouth at bedtime as needed for sleep.   amLODipine  5 MG tablet Commonly known as: NORVASC  Take 5 mg by mouth daily.    atorvastatin  80 MG tablet Commonly known as: LIPITOR Take 80 mg by mouth daily.   carvedilol  25 MG tablet Commonly known as: COREG  Take 25 mg by mouth 2 (two) times daily.   furosemide 40 MG tablet Commonly known as: LASIX Take 40 mg by mouth daily.   gabapentin  300 MG capsule Commonly known as: NEURONTIN  Take by mouth.  1 (one) Capsule Capsule three times a day   hydrALAZINE  50 MG tablet Commonly known as: APRESOLINE  Take 50 mg by mouth 3 (three) times daily.   Multi-Vitamin tablet Take 1 tablet by mouth daily.   Restore Silver  Dressing 2X2 Pads Apply 1 Application topically daily.   warfarin 1 MG tablet Commonly known as: COUMADIN  Take 1 mg by mouth daily.               Durable Medical Equipment  (From admission, onward)           Start     Ordered   12/20/23 1105  For home use only DME Other see comment  Once       Comments: Alternating Pressure  Pad Mattress  Question:  Length of Need  Answer:  Lifetime   12/20/23 1105   12/20/23 0905  For home use only DME Other see comment  Once       Comments: Low air loss mattress  Question:  Length of Need  Answer:  Lifetime   12/20/23 0904   12/19/23 1550  For home use only DME Other see comment  Once       Comments: Deitra lift  Question:  Length of Need  Answer:  Lifetime   12/19/23 1549              Discharge Care Instructions  (From admission, onward)           Start     Ordered   12/20/23 0000  Discharge wound care:       Comments: Apply Mepilex border to sacral wound keep wound dry and clean   12/20/23 1102            Follow-up Information     Alluri, Keller BROCKS, MD Follow up on 01/10/2024.   Specialty: Cardiology Why: Go at 10:00am. Contact information: [address] [place] [address] [phone]                Discharge Exam: Fredricka Weights   12/11/23 1101  Weight: 59.7 kg   GEN: NAD SKIN: Warm and dry EYES: No pallor or icterus ENT: MMM CV:  RRR PULM: CTA B ABD: soft, ND, NT, +BS CNS: AAO x 1 (person), nonfocal  but generally weak EXT: No edema or tenderness   Condition at discharge: stable  The results of significant diagnostics from this hospitalization (including imaging, microbiology, ancillary and laboratory) are listed below for reference.   Imaging Studies: DG Foot 2 Views Right Result Date: 12/18/2023 CLINICAL DATA:  Right foot pain. EXAM: RIGHT FOOT - 2 VIEW COMPARISON:  None Available. FINDINGS: There is no evidence of fracture or dislocation. Hammertoe deformity of the toes. No erosions or bone destruction. Mild soft tissue edema. No soft tissue gas. No radiopaque foreign body. IMPRESSION: 1. Mild soft tissue edema. No acute osseous abnormality. 2. Hammertoe deformity of the toes. Electronically Signed   By: Andrea Gasman M.D.   On: 12/18/2023 14:04   DG Knee 1-2 Views Right Result Date: 12/18/2023 CLINICAL DATA:  Right knee pain. EXAM: RIGHT KNEE - 1-2 VIEW COMPARISON:  None Available. FINDINGS: No evidence of fracture, dislocation, or joint effusion. Mild tibiofemoral joint space narrowing and peripheral spurring. No erosions or focal bone abnormality. Vascular calcifications. Soft tissues are otherwise unremarkable. IMPRESSION: Mild osteoarthritis. No acute findings. Electronically Signed   By: Andrea Gasman M.D.   On: 12/18/2023 14:03   US  Venous Img Lower Bilateral (DVT) Result Date: 12/18/2023 CLINICAL DATA:  [id number] Leg pain, bilateral [id number] EXAM: BILATERAL LOWER EXTREMITY VENOUS DOPPLER ULTRASOUND TECHNIQUE: Gray-scale sonography with graded compression, as well as color Doppler and duplex ultrasound were performed to evaluate the lower extremity deep venous systems from the level of the common femoral vein and including the common femoral, femoral, profunda femoral, popliteal and calf veins including the posterior tibial, peroneal and gastrocnemius veins when visible. The superficial great saphenous vein was  also interrogated. Spectral Doppler was utilized to evaluate flow at rest and with distal augmentation maneuvers in the common femoral, femoral and popliteal veins. COMPARISON:  CT AP, 12/08/2023. FINDINGS: RIGHT LOWER EXTREMITY VENOUS Normal compressibility of the RIGHT common femoral, superficial femoral, and popliteal veins, as well as the visualized  calf veins. Visualized portions of profunda femoral vein and great saphenous vein unremarkable. No filling defects to suggest DVT on grayscale or color Doppler imaging. Doppler waveforms show normal direction of venous flow, normal respiratory plasticity and response to augmentation. OTHER No evidence of superficial thrombophlebitis or abnormal fluid collection. Limitations: none LEFT LOWER EXTREMITY VENOUS Normal compressibility of the LEFT common femoral, superficial femoral, and popliteal veins, as well as the visualized calf veins. Visualized portions of profunda femoral vein and great saphenous vein unremarkable. No filling defects to suggest DVT on grayscale or color Doppler imaging. Doppler waveforms show normal direction of venous flow, normal respiratory plasticity and response to augmentation. OTHER No evidence of superficial thrombophlebitis or abnormal fluid collection. Limitations: none IMPRESSION: No evidence of femoropopliteal DVT or superficial thrombophlebitis within either lower extremity. Thom Hall, MD [facility] Electronically Signed   By: Thom Hall M.D.   On: 12/18/2023 07:45   IR US  DRAIN/INJ MAJOR JOINT/BURSA Result Date: 12/16/2023 INDICATION: 89 year old with possible right shoulder joint infection EXAM: IR DRAIN/INJ MAJOR JOINT/BURSA MEDICATIONS: None ANESTHESIA/SEDATION: None COMPLICATIONS: None immediate. PROCEDURE: Informed written consent was obtained from the patient after a thorough discussion of the procedural risks, benefits and alternatives. All questions were  addressed. Maximal Sterile Barrier Technique was utilized including caps, mask, sterile gowns, sterile gloves, sterile drape, hand hygiene and skin antiseptic. A timeout was performed prior to the initiation of the procedure. Ultrasound was used to the right shoulder. There is a trace minimally complex fluid collection within the joint capsule. Local anesthesia was attained by infiltration with 1% lidocaine . Under real-time ultrasound guidance, an 18 gauge needle was advanced into the fluid collection. Aspiration yields only a drop of synovial fluid. Therefore, 1 cc of sterile normal saline was lavaged into the joint capsule and then 3 aspirated yielding 1 cc of minimally bloody fluid. This will be sent for Gram stain and culture. IMPRESSION: Sonographically, there is minimal right shoulder joint effusion. Certainly less than seen on the prior MRI from this past July. Successful aspiration of a very small amount of minimally bloody synovial fluid. No gross purulence. Electronically Signed   By: Wilkie Lent M.D.   On: 12/16/2023 16:35   CT SHOULDER RIGHT WO CONTRAST Result Date: 12/16/2023 CLINICAL DATA:  Chronic right shoulder pain. EXAM: CT OF THE UPPER RIGHT EXTREMITY WITHOUT CONTRAST TECHNIQUE: Multidetector CT imaging of the upper right extremity was performed according to the standard protocol. RADIATION DOSE REDUCTION: This exam was performed according to the departmental dose-optimization program which includes automated exposure control, adjustment of the mA and/or kV according to patient size and/or use of iterative reconstruction technique. COMPARISON:  Radiographs 12/10/2023.  MR arthrogram 10/05/2023 FINDINGS: Bones/Joint/Cartilage No evidence of acute fracture, dislocation or humeral head osteonecrosis. As seen previously, there are advanced glenohumeral degenerative changes with joint space narrowing and large osteophytes of the humeral head and neck. There is moderate-sized joint effusion  with probable small intra-articular loose bodies. Mild acromioclavicular degenerative changes. Ligaments Suboptimally assessed by CT. Muscles and Tendons Mild generalized muscular atrophy. No significant focal muscular atrophy identified. The subscapularis tendon is medially displaced by large anterior osteophytes. Soft tissues Mild generalized soft tissue edema surrounding the shoulder without apparent focal abnormality. No evidence of foreign body or soft tissue emphysema. There is a moderate-sized dependent right pleural effusion with dependent right lung atelectasis. Aortic and great vessel atherosclerosis noted. IMPRESSION: 1. Advanced glenohumeral degenerative changes with joint effusion and probable small intra-articular loose bodies.  2. No acute osseous findings. 3. Moderate-sized dependent right pleural effusion with dependent right lung atelectasis. 4.  Aortic Atherosclerosis (ICD10-I70.0). Electronically Signed   By: Elsie Perone M.D.   On: 12/16/2023 11:03   ECHO TEE Result Date: 12/14/2023    TRANSESOPHOGEAL ECHO REPORT   Patient Name:   AZILEE PIRRO Date of Exam: 12/14/2023 Medical Rec #:  [id number]     Height:       62.0 in Accession #:    [id number]    Weight:       131.6 lb Date of Birth:  1932/11/16     BSA:          1.600 m Patient Age:    89 years      BP:           123/68 mmHg Patient Gender: F             HR:           88 bpm. Exam Location:  [facility] Procedure: Transesophageal Echo and Color Doppler (Both Spectral and Color Flow            Doppler were utilized during procedure). Indications:     Endocarditis  History:         Patient has prior history of Echocardiogram examinations, most                  recent 12/12/2023. Endocarditis.  Sonographer:     Ashley McNeely-Sloane Referring Phys:  [id number] CARALYN HUDSON Diagnosing Phys: Keller Paterson PROCEDURE: After discussion of the risks and benefits of a TEE, an informed consent was obtained from a family member. The transesophogeal probe  was passed without difficulty through the esophogus of the patient. Sedation performed by different physician. The patient was monitored while under deep sedation. Image quality was excellent. The patient's vital signs; including heart rate, blood pressure, and oxygen saturation; remained stable throughout the procedure. The patient developed no complications during the procedure.  IMPRESSIONS  1. Left ventricular ejection fraction, by estimation, is 55 to 60%. The left ventricle has normal function.  2. Right ventricular systolic function is normal. The right ventricular size is normal.  3. Left atrial size was dilated. No left atrial/left atrial appendage thrombus was detected.  4. The mitral valve is normal in structure. Trivial mitral valve regurgitation.  5. The aortic valve is tricuspid. Aortic valve regurgitation is not visualized. Aortic valve sclerosis/calcification is present, without any evidence of aortic stenosis. Conclusion(s)/Recommendation(s): No evidence of vegetation/infective endocarditis on this transesophageael echocardiogram. FINDINGS  Left Ventricle: Left ventricular ejection fraction, by estimation, is 55 to 60%. The left ventricle has normal function. The left ventricular internal cavity size was normal in size. Right Ventricle: The right ventricular size is normal. No increase in right ventricular wall thickness. Right ventricular systolic function is normal. Left Atrium: Left atrial size was dilated. No left atrial/left atrial appendage thrombus was detected. Right Atrium: Right atrial size was normal in size. Pericardium: There is no evidence of pericardial effusion. Mitral Valve: The mitral valve is normal in structure. Trivial mitral valve regurgitation. Tricuspid Valve: The tricuspid valve is normal in structure. Tricuspid valve regurgitation is mild. Aortic Valve: The aortic valve is tricuspid. Aortic valve regurgitation is not visualized. Aortic valve sclerosis/calcification is  present, without any evidence of aortic stenosis. Pulmonic Valve: The pulmonic valve was normal in structure. Pulmonic valve regurgitation is trivial. Aorta: The aortic root is normal in size and structure. IAS/Shunts: No atrial level shunt  detected by color flow Doppler. Keller Paterson Electronically signed by Keller Paterson Signature Date/Time: 12/14/2023/5:41:34 PM    Final    ECHOCARDIOGRAM COMPLETE Result Date: 12/12/2023    ECHOCARDIOGRAM REPORT   Patient Name:   MAKAYAH PAULI Date of Exam: 12/12/2023 Medical Rec #:  [id number]     Height:       62.0 in Accession #:    [id number]    Weight:       131.6 lb Date of Birth:  02/02/1933     BSA:          1.600 m Patient Age:    89 years      BP:           161/71 mmHg Patient Gender: F             HR:           56 bpm. Exam Location:  [facility] Procedure: 2D Echo, Cardiac Doppler and Color Doppler (Both Spectral and Color            Flow Doppler were utilized during procedure). Indications:     Bacteremia R78.81  History:         Patient has no prior history of Echocardiogram examinations.                  Arrythmias:Atrial Fibrillation; Risk Factors:Hypertension.  Sonographer:     Christopher Furnace Referring Phys:  [id number] DONALD BERLIN Diagnosing Phys: Keller Paterson  Sonographer Comments: Technically challenging study due to limited acoustic windows and no apical window. IMPRESSIONS  1. Technically difficult study with no apical views.  2. Left ventricular ejection fraction, by estimation, is 60 to 65%. The left ventricle has normal function. Left ventricular endocardial border not optimally defined to evaluate regional wall motion. 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 Left ventricular diastolic parameters are indeterminate.  3. Right ventricular systolic function was not well visualized. The right ventricular size is not well visualized.  4. The mitral valve is normal in structure. Trivial mitral valve regurgitation.  5.  Echogenicity (0.5 cm X 0.5 cm) noted on ventricular aspect of aortic valve which can suggest vegetation vs degenerative change. Recommend TEE for further evaluation as clinically indicated. The aortic valve is tricuspid. Aortic valve regurgitation is  not visualized. Aortic valve sclerosis/calcification is present, without any evidence of aortic stenosis.  6. The inferior vena cava is normal in size with greater than 50% respiratory variability, suggesting right atrial pressure of 3 mmHg. FINDINGS  Left Ventricle: Left ventricular ejection fraction, by estimation, is 60 to 65%. The left ventricle has normal function. Left ventricular endocardial border not optimally defined to evaluate regional wall motion. The left ventricular internal cavity size was normal in size. 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 Left ventricular diastolic parameters are indeterminate. Right Ventricle: The right ventricular size is not well visualized. Right vetricular wall thickness was not well visualized. Right ventricular systolic function was not well visualized. Left Atrium: Left atrial size was not well visualized. Right Atrium: Right atrial size was not well visualized. Pericardium: There is no evidence of pericardial effusion. Mitral Valve: The mitral valve is normal in structure. Mild mitral annular calcification. Trivial mitral valve regurgitation. Tricuspid Valve: The tricuspid valve is normal in structure. Tricuspid valve regurgitation is mild. Aortic Valve: Echogenicity (0.5 cm X 0.5 cm) noted on ventricular aspect of aortic valve which can suggest vegetation vs degenerative change. Recommend TEE for  further evaluation as clinically indicated. The aortic valve is tricuspid. Aortic valve regurgitation is not visualized. Aortic valve sclerosis/calcification is present, without any evidence of aortic stenosis. Pulmonic Valve: The pulmonic valve was not well visualized. Pulmonic valve  regurgitation is trivial. Aorta: The aortic root is normal in size and structure. Venous: The inferior vena cava is normal in size with greater than 50% respiratory variability, suggesting right atrial pressure of 3 mmHg. IAS/Shunts: The interatrial septum was not well visualized.  LEFT VENTRICLE PLAX 2D LVIDd:         3.00 cm LVIDs:         2.00 cm LV PW:         1.30 cm LV IVS:        1.80 cm LVOT diam:     2.00 cm LVOT Area:     3.14 cm  LEFT ATRIUM         Index LA diam:    4.80 cm 3.00 cm/m   AORTA Ao Root diam: 3.30 cm TRICUSPID VALVE TR Peak grad:   28.1 mmHg TR Vmax:        265.00 cm/s  SHUNTS Systemic Diam: 2.00 cm Keller Paterson Electronically signed by Keller Paterson Signature Date/Time: 12/12/2023/12:47:21 PM    Final    DG Chest Port 1 View Result Date: 12/11/2023 CLINICAL DATA:  PICC line placement EXAM: PORTABLE CHEST 1 VIEW COMPARISON:  12/11/2023 FINDINGS: Left PICC line in place with the tip in the SVC. Cardiomegaly, vascular congestion. Interstitial prominence may reflect interstitial edema. No effusions. IMPRESSION: Left PICC line tip in the SVC. Cardiomegaly with vascular congestion and suspected early interstitial edema. Electronically Signed   By: Franky Crease M.D.   On: 12/11/2023 17:10   DG Chest Port 1 View Result Date: 12/11/2023 CLINICAL DATA:  PICC line placement. EXAM: PORTABLE CHEST 1 VIEW COMPARISON:  12/08/2023 FINDINGS: The cardio pericardial silhouette is enlarged. There is pulmonary vascular congestion without overt pulmonary edema. No pleural effusion or pneumothorax. Left PICC line tip overlies the region of the innominate vein confluence. Bones are diffusely demineralized with degenerative changes noted in both shoulders. IMPRESSION: Left PICC line tip overlies the region of the innominate vein confluence. Electronically Signed   By: Camellia Candle M.D.   On: 12/11/2023 08:56   DG Shoulder Right Result Date: 12/10/2023 CLINICAL DATA:  Pain EXAM: RIGHT SHOULDER - 2+  VIEW COMPARISON:  Right shoulder x-ray 10/04/2023. FINDINGS: The bones are osteopenic. There is no acute fracture or dislocation identified. There severe degenerative changes of the glenohumeral joint with marked joint space narrowing and exuberant osteophyte formation. Findings have progressed compared to prior study. Moderate degenerative changes of the acromioclavicular joint appear similar to the prior study. Soft tissues are within normal limits. IMPRESSION: 1. Progression of severe degenerative changes of the glenohumeral joint. 2. No acute fracture. Electronically Signed   By: Greig Pique M.D.   On: 12/10/2023 18:24   US  EKG SITE RITE Result Date: 12/10/2023 If Site Rite image not attached, placement could not be confirmed due to current cardiac rhythm.  CT Renal Stone Study Result Date: 12/08/2023 CLINICAL DATA:  Abdominal/flank pain, stone suspected EXAM: CT ABDOMEN AND PELVIS WITHOUT CONTRAST TECHNIQUE: Multidetector CT imaging of the abdomen and pelvis was performed following the standard protocol without IV contrast. RADIATION DOSE REDUCTION: This exam was performed according to the departmental dose-optimization program which includes automated exposure control, adjustment of the mA and/or kV according to patient size and/or use of iterative reconstruction  technique. COMPARISON:  None Available. FINDINGS: Lower chest: Cardiomegaly. Severe atherosclerotic plaque. Trace to small left and trace right pleural effusions. Small hiatal hernia. Hepatobiliary: No focal liver abnormality. No gallstones, gallbladder wall thickening, or pericholecystic fluid. No biliary dilatation. Pancreas: Diffusely atrophic. No focal lesion. Otherwise normal pancreatic contour. No surrounding inflammatory changes. No main pancreatic ductal dilatation. Spleen: Normal in size without focal abnormality. Adrenals/Urinary Tract: No adrenal nodule bilaterally. Fluid density lesion of the right kidney likely represents simple  renal cyst. Bilateral high density lesions within the kidneys likely represent proteinaceous or hemorrhagic cysts. There is 4 mm left nephrolithiasis. The urinary bladder is decompressed with Foley catheter tip and balloon terminating within its lumen. Stomach/Bowel: Stomach is within normal limits. No evidence of bowel wall thickening or dilatation. The appendix is not definitely identified with no inflammatory changes in the right lower quadrant to suggest acute appendicitis. Vascular/Lymphatic: No abdominal aorta or iliac aneurysm. Severe atherosclerotic plaque of the aorta and its branches. No abdominal, pelvic, or inguinal lymphadenopathy. Reproductive: Status post hysterectomy. No adnexal masses. Other: No intraperitoneal free fluid. No intraperitoneal free gas. No organized fluid collection. Musculoskeletal: Tiny fat containing umbilical hernia. No suspicious lytic or blastic osseous lesions. No acute displaced fracture. Marked L5-S1 disc bulge. IMPRESSION: 1. Trace to small left and trace right pleural effusions. 2. Cardiomegaly. 3. Small hiatal hernia. 4. Nonobstructive 4 mm left nephrolithiasis. 5. Marked L5-S1 disc bulge. 6. Otherwise limited evaluation on this noncontrast study. 7.  Aortic Atherosclerosis (ICD10-I70.0). Electronically Signed   By: Morgane  Naveau M.D.   On: 12/08/2023 17:56   CT HEAD WO CONTRAST ( ) Result Date: 12/08/2023 CLINICAL DATA:  Altered level of consciousness, hypotension EXAM: CT HEAD WITHOUT CONTRAST TECHNIQUE: Contiguous axial images were obtained from the base of the skull through the vertex without intravenous contrast. RADIATION DOSE REDUCTION: This exam was performed according to the departmental dose-optimization program which includes automated exposure control, adjustment of the mA and/or kV according to patient size and/or use of iterative reconstruction technique. COMPARISON:  10/04/2023 FINDINGS: Brain: Stable hypodensities within the periventricular white  matter and basal ganglia, compatible with chronic small vessel ischemic changes. No evidence of acute infarct or hemorrhage. Lateral ventricles and midline structures are unremarkable. No acute extra-axial fluid collections. No mass effect. Vascular: No hyperdense vessel or unexpected calcification. Skull: Normal. Negative for fracture or focal lesion. Sinuses/Orbits: Opacification of the left sphenoid sinus. Polypoid mucosal thickening right maxillary sinus. No gas fluid levels. Other: None. IMPRESSION: 1. Stable chronic small vessel ischemic changes. No acute intracranial process. 2. Paranasal sinus disease as above. Electronically Signed   By: Ozell Daring M.D.   On: 12/08/2023 17:47   DG Chest Portable 1 View Result Date: 12/08/2023 CLINICAL DATA:  AMS EXAM: PORTABLE CHEST - 1 VIEW COMPARISON:  10/04/2023 FINDINGS: No focal airspace consolidation, pleural effusion, or pneumothorax. Moderate cardiomegaly. Tortuous aorta with aortic atherosclerosis. No acute fracture or destructive lesions. Multilevel thoracic osteophytosis. Advanced degenerative changes of both shoulders. IMPRESSION: No acute cardiopulmonary abnormality. Electronically Signed   By: Rogelia Myers M.D.   On: 12/08/2023 15:34    Microbiology: Results for orders placed or performed during the hospital encounter of 12/08/23  Resp panel by RT-PCR (RSV, Flu A&B, Covid) Anterior Nasal Swab     Status: None   Collection Time: 12/08/23  2:56 PM   Specimen: Anterior Nasal Swab  Result Value Ref Range Status   SARS Coronavirus 2 by RT PCR NEGATIVE NEGATIVE Final    Comment: (NOTE) SARS-CoV-2  target nucleic acids are NOT DETECTED.  The SARS-CoV-2 RNA is generally detectable in upper respiratory specimens during the acute phase of infection. The lowest concentration of SARS-CoV-2 viral copies this assay can detect is 138 copies/mL. A negative result does not preclude SARS-Cov-2 infection and should not be used as the sole basis for  treatment or other patient management decisions. A negative result may occur with  improper specimen collection/handling, submission of specimen other than nasopharyngeal swab, presence of viral mutation(s) within the areas targeted by this assay, and inadequate number of viral copies(<138 copies/mL). A negative result must be combined with clinical observations, patient history, and epidemiological information. The expected result is Negative.  Fact Sheet for Patients:  BloggerCourse.com  Fact Sheet for Healthcare Providers:  SeriousBroker.it  This test is no t yet approved or cleared by the [place]  FDA and  has been authorized for detection and/or diagnosis of SARS-CoV-2 by FDA under an Emergency Use Authorization (EUA). This EUA will remain  in effect (meaning this test can be used) for the duration of the COVID-19 declaration under Section 564(b)(1) of the Act, 21 U.S.C.section 360bbb-3(b)(1), unless the authorization is terminated  or revoked sooner.       Influenza A by PCR NEGATIVE NEGATIVE Final   Influenza B by PCR NEGATIVE NEGATIVE Final    Comment: (NOTE) The Xpert Xpress SARS-CoV-2/FLU/RSV plus assay is intended as an aid in the diagnosis of influenza from Nasopharyngeal swab specimens and should not be used as a sole basis for treatment. Nasal washings and aspirates are unacceptable for Xpert Xpress SARS-CoV-2/FLU/RSV testing.  Fact Sheet for Patients: BloggerCourse.com  Fact Sheet for Healthcare Providers: SeriousBroker.it  This test is not yet approved or cleared by the [place]  FDA and has been authorized for detection and/or diagnosis of SARS-CoV-2 by FDA under an Emergency Use Authorization (EUA). This EUA will remain in effect (meaning this test can be used) for the duration of the COVID-19 declaration under Section 564(b)(1) of the Act, 21  U.S.C. section 360bbb-3(b)(1), unless the authorization is terminated or revoked.     Resp Syncytial Virus by PCR NEGATIVE NEGATIVE Final    Comment: (NOTE) Fact Sheet for Patients: BloggerCourse.com  Fact Sheet for Healthcare Providers: SeriousBroker.it  This test is not yet approved or cleared by the [place]  FDA and has been authorized for detection and/or diagnosis of SARS-CoV-2 by FDA under an Emergency Use Authorization (EUA). This EUA will remain in effect (meaning this test can be used) for the duration of the COVID-19 declaration under Section 564(b)(1) of the Act, 21 U.S.C. section 360bbb-3(b)(1), unless the authorization is terminated or revoked.  Performed at [facility], [address], [place], [place] [address]   Urine Culture (for pregnant, neutropenic or urologic patients or patients with an indwelling urinary catheter)     Status: Abnormal   Collection Time: 12/08/23  4:33 PM   Specimen: Urine, Catheterized  Result Value Ref Range Status   Specimen Description   Final    URINE, CATHETERIZED Performed at [facility], [address], [place], [place] [address]    Special Requests   Final    NONE Performed at [facility], [address] [place], [place] [address]    Culture >=100,000 COLONIES/mL ESCHERICHIA COLI (A)  Final   Report Status 12/11/2023 FINAL  Final   Organism ID, Bacteria ESCHERICHIA COLI (A)  Final      Susceptibility   Escherichia coli - MIC*    AMPICILLIN 4  SENSITIVE Sensitive     CEFAZOLIN  (URINE) Value in next row Sensitive      <=1 SENSITIVEThis is a modified FDA-approved test that has been validated and its performance characteristics determined by the reporting laboratory.  This laboratory is certified under the Clinical Laboratory Improvement Amendments CLIA as qualified to perform high complexity clinical laboratory testing.    CEFEPIME Value in  next row Sensitive      <=1 SENSITIVEThis is a modified FDA-approved test that has been validated and its performance characteristics determined by the reporting laboratory.  This laboratory is certified under the Clinical Laboratory Improvement Amendments CLIA as qualified to perform high complexity clinical laboratory testing.    ERTAPENEM Value in next row Sensitive      <=1 SENSITIVEThis is a modified FDA-approved test that has been validated and its performance characteristics determined by the reporting laboratory.  This laboratory is certified under the Clinical Laboratory Improvement Amendments CLIA as qualified to perform high complexity clinical laboratory testing.    CEFTRIAXONE  Value in next row Sensitive      <=1 SENSITIVEThis is a modified FDA-approved test that has been validated and its performance characteristics determined by the reporting laboratory.  This laboratory is certified under the Clinical Laboratory Improvement Amendments CLIA as qualified to perform high complexity clinical laboratory testing.    CIPROFLOXACIN Value in next row Sensitive      <=1 SENSITIVEThis is a modified FDA-approved test that has been validated and its performance characteristics determined by the reporting laboratory.  This laboratory is certified under the Clinical Laboratory Improvement Amendments CLIA as qualified to perform high complexity clinical laboratory testing.    GENTAMICIN Value in next row Sensitive      <=1 SENSITIVEThis is a modified FDA-approved test that has been validated and its performance characteristics determined by the reporting laboratory.  This laboratory is certified under the Clinical Laboratory Improvement Amendments CLIA as qualified to perform high complexity clinical laboratory testing.    NITROFURANTOIN Value in next row Sensitive      <=1 SENSITIVEThis is a modified FDA-approved test that has been validated and its performance characteristics determined by the reporting  laboratory.  This laboratory is certified under the Clinical Laboratory Improvement Amendments CLIA as qualified to perform high complexity clinical laboratory testing.    TRIMETH/SULFA Value in next row Sensitive      <=1 SENSITIVEThis is a modified FDA-approved test that has been validated and its performance characteristics determined by the reporting laboratory.  This laboratory is certified under the Clinical Laboratory Improvement Amendments CLIA as qualified to perform high complexity clinical laboratory testing.    AMPICILLIN/SULBACTAM Value in next row Sensitive      <=1 SENSITIVEThis is a modified FDA-approved test that has been validated and its performance characteristics determined by the reporting laboratory.  This laboratory is certified under the Clinical Laboratory Improvement Amendments CLIA as qualified to perform high complexity clinical laboratory testing.    PIP/TAZO Value in next row Sensitive      <=4 SENSITIVEThis is a modified FDA-approved test that has been validated and its performance characteristics determined by the reporting laboratory.  This laboratory is certified under the Clinical Laboratory Improvement Amendments CLIA as qualified to perform high complexity clinical laboratory testing.    MEROPENEM Value in next row Sensitive      <=4 SENSITIVEThis is a modified FDA-approved test that has been validated and its performance characteristics determined by the reporting laboratory.  This laboratory is certified under the Clinical Laboratory Improvement  Amendments CLIA as qualified to perform high complexity clinical laboratory testing.    * >=100,000 COLONIES/mL ESCHERICHIA COLI  Blood culture (routine x 2)     Status: Abnormal   Collection Time: 12/08/23  5:17 PM   Specimen: BLOOD  Result Value Ref Range Status   Specimen Description   Final    BLOOD BLOOD RIGHT FOREARM Performed at [facility], [address] [place], [place] [address]    Special  Requests   Final    BOTTLES DRAWN AEROBIC AND ANAEROBIC Blood Culture results may not be optimal due to an inadequate volume of blood received in culture bottles Performed at [facility], [address] [place], [place] [address]    Culture  Setup Time   Final    GRAM POSITIVE COCCI ANAEROBIC BOTTLE ONLY Organism ID to follow CRITICAL RESULT CALLED TO, READ BACK BY AND VERIFIED WITHBETHA MAFFUCCI [facility] AT 1042 12/09/23 RAM Performed at [facility], [address], [place], [place] [address]    Culture STAPHYLOCOCCUS AUREUS (A)  Final   Report Status 12/11/2023 FINAL  Final   Organism ID, Bacteria STAPHYLOCOCCUS AUREUS  Final      Susceptibility   Staphylococcus aureus - MIC*    CIPROFLOXACIN <=0.5 SENSITIVE Sensitive     ERYTHROMYCIN <=0.25 SENSITIVE Sensitive     GENTAMICIN <=0.5 SENSITIVE Sensitive     OXACILLIN <=0.25 SENSITIVE Sensitive     TETRACYCLINE <=1 SENSITIVE Sensitive     VANCOMYCIN 1 SENSITIVE Sensitive     TRIMETH/SULFA <=10 SENSITIVE Sensitive     CLINDAMYCIN <=0.25 SENSITIVE Sensitive     RIFAMPIN <=0.5 SENSITIVE Sensitive     Inducible Clindamycin NEGATIVE Sensitive     LINEZOLID 2 SENSITIVE Sensitive     * STAPHYLOCOCCUS AUREUS  Blood Culture ID Panel (Reflexed)     Status: Abnormal   Collection Time: 12/08/23  5:17 PM  Result Value Ref Range Status   Enterococcus faecalis NOT DETECTED NOT DETECTED Final   Enterococcus Faecium NOT DETECTED NOT DETECTED Final   Listeria monocytogenes NOT DETECTED NOT DETECTED Final   Staphylococcus species DETECTED (A) NOT DETECTED Final    Comment: CRITICAL RESULT CALLED TO, READ BACK BY AND VERIFIED WITH: MAFFUCCI CLOSE AT 1042 12/09/23 RAM    Staphylococcus aureus (BCID) DETECTED (A) NOT DETECTED Final    Comment: CRITICAL RESULT CALLED TO, READ BACK BY AND VERIFIED WITH: MAFFUCCI CLOSE AT 1042 12/09/23 RAM    Staphylococcus epidermidis NOT DETECTED NOT DETECTED Final   Staphylococcus lugdunensis NOT  DETECTED NOT DETECTED Final   Streptococcus species NOT DETECTED NOT DETECTED Final   Streptococcus agalactiae NOT DETECTED NOT DETECTED Final   Streptococcus pneumoniae NOT DETECTED NOT DETECTED Final   Streptococcus pyogenes NOT DETECTED NOT DETECTED Final   A.calcoaceticus-baumannii NOT DETECTED NOT DETECTED Final   Bacteroides fragilis NOT DETECTED NOT DETECTED Final   Enterobacterales NOT DETECTED NOT DETECTED Final   Enterobacter cloacae complex NOT DETECTED NOT DETECTED Final   Escherichia coli NOT DETECTED NOT DETECTED Final   Klebsiella aerogenes NOT DETECTED NOT DETECTED Final   Klebsiella oxytoca NOT DETECTED NOT DETECTED Final   Klebsiella pneumoniae NOT DETECTED NOT DETECTED Final   Proteus species NOT DETECTED NOT DETECTED Final   Salmonella species NOT DETECTED NOT DETECTED Final   Serratia marcescens NOT DETECTED NOT DETECTED Final   Haemophilus influenzae NOT DETECTED NOT DETECTED Final   Neisseria meningitidis NOT DETECTED NOT DETECTED Final   Pseudomonas aeruginosa NOT DETECTED NOT DETECTED Final  Stenotrophomonas maltophilia NOT DETECTED NOT DETECTED Final   Candida albicans NOT DETECTED NOT DETECTED Final   Candida auris NOT DETECTED NOT DETECTED Final   Candida glabrata NOT DETECTED NOT DETECTED Final   Candida krusei NOT DETECTED NOT DETECTED Final   Candida parapsilosis NOT DETECTED NOT DETECTED Final   Candida tropicalis NOT DETECTED NOT DETECTED Final   Cryptococcus neoformans/gattii NOT DETECTED NOT DETECTED Final   Meth resistant mecA/C and MREJ NOT DETECTED NOT DETECTED Final    Comment: Performed at [facility], [address] [place], [place] [address]  Blood culture (routine x 2)     Status: None   Collection Time: 12/08/23  7:08 PM   Specimen: BLOOD  Result Value Ref Range Status   Specimen Description BLOOD BLOOD LEFT ARM  Final   Special Requests   Final    BOTTLES DRAWN AEROBIC AND ANAEROBIC Blood Culture adequate volume   Culture    Final    NO GROWTH 5 DAYS Performed at [facility], [address] [place], [place] [address]    Report Status 12/13/2023 FINAL  Final  Culture, blood (Routine X 2) w Reflex to ID Panel     Status: None   Collection Time: 12/11/23  6:38 AM   Specimen: BLOOD  Result Value Ref Range Status   Specimen Description BLOOD LEFT ANTECUBITAL  Final   Special Requests   Final    BOTTLES DRAWN AEROBIC AND ANAEROBIC Blood Culture results may not be optimal due to an inadequate volume of blood received in culture bottles   Culture   Final    NO GROWTH 5 DAYS Performed at [facility], [address] [place], [place] [address]    Report Status 12/16/2023 FINAL  Final  Culture, blood (Routine X 2) w Reflex to ID Panel     Status: None   Collection Time: 12/11/23  6:38 AM   Specimen: BLOOD LEFT HAND  Result Value Ref Range Status   Specimen Description BLOOD LEFT HAND  Final   Special Requests   Final    BOTTLES DRAWN AEROBIC AND ANAEROBIC Blood Culture adequate volume   Culture   Final    NO GROWTH 5 DAYS Performed at [facility], [address], [place], [place] [address]    Report Status 12/16/2023 FINAL  Final  Aerobic/Anaerobic Culture w Gram Stain (surgical/deep wound)     Status: None (Preliminary result)   Collection Time: 12/16/23  4:36 PM   Specimen: Wound; Synovial Fluid  Result Value Ref Range Status   Specimen Description   Final    WOUND Performed at [facility], [address], [place], [place] [address]    Special Requests   Final    NONE Performed at [facility], [address] [place], [place] [address]    Gram Stain NO WBC SEEN NO ORGANISMS SEEN   Final   Culture   Final    NO GROWTH 4 DAYS NO ANAEROBES ISOLATED; CULTURE IN PROGRESS FOR 5 DAYS Performed at [facility], [address] [place], [place] [address]    Report Status PENDING  Incomplete    Labs: CBC: Recent Labs  Lab 12/14/23 0632  12/16/23 0630 12/17/23 1021  WBC 8.3 9.0 8.3  HGB 9.1* 8.1* 10.0*  HCT 28.8* 26.0* 31.4*  MCV 87.8 88.7 87.5  PLT 217 241 267   Basic Metabolic Panel: Recent Labs  Lab 12/14/23 0632 12/15/23 0518 12/16/23 0630 12/17/23 0900  NA 143 141 142 142  K 3.3* 3.4* 3.7 3.9  CL 107 106 108 110  CO2 24 24 24  20*  GLUCOSE 99 72 82 75  BUN 11 13 16 16   CREATININE 1.05* 1.14* 1.29* 1.16*  CALCIUM  8.6* 8.2* 8.4* 8.4*  MG  --   --  2.1  --   PHOS  --   --  2.7 2.5   Liver Function Tests: Recent Labs  Lab 12/16/23 0630 12/17/23 0900  ALBUMIN 1.9* 1.9*   CBG: Recent Labs  Lab 12/19/23 1325 12/19/23 2055  GLUCAP 96 91    Discharge time spent: greater than 30 minutes.  Signed: AIDA CHO, MD [facility] 12/20/2023

## 2023-12-20 NOTE — Progress Notes (Cosign Needed)
 Patient will require APP&P Patient has limited mobility (difficulty transferring and bed mobility), and incontinent of urine and stool

## 2023-12-20 NOTE — Plan of Care (Signed)
 IV removed, discharge instructions reviewed with son over the phone, patient discharged to [facility].

## 2023-12-20 NOTE — TOC Transition Note (Addendum)
 Transition of Care [facility]) - Discharge Note   Patient Details  Name: Cassandra Weiss MRN: [id number] Date of Birth: [birth year]  Transition of [facility]) CM/SW Contact:  [name], RN Phone Number: 12/20/2023, 1:51 PM   Clinical Narrative:     Patient to discharge today Referral made for alternating pressure pad to Mitch with [facility]  with [facility] notified of discharge.  Marinell with [facility] notified of discharge  [facility] transport arranged         Patient Goals and CMS Choice            Discharge Placement                       Discharge Plan and Services Additional resources added to the After Visit Summary for                                       Social Drivers of Health (SDOH) Interventions SDOH Screenings   Food Insecurity: No Food Insecurity (12/09/2023)  Housing: Low Risk  (12/09/2023)  Transportation Needs: No Transportation Needs (12/09/2023)  Utilities: Not At Risk (12/09/2023)  Financial Resource Strain: Low Risk  (08/17/2023)   Received from [facility]  Physical Activity: Insufficiently Active (01/13/2018)  Social Connections: Moderately Integrated (12/09/2023)  Stress: No Stress Concern Present (01/13/2018)  Tobacco Use: Low Risk  (12/13/2023)     Readmission Risk Interventions    12/13/2023    1:01 PM  Readmission Risk Prevention Plan  Transportation Screening Complete  HRI or Home Care Consult Complete  Palliative Care Screening Complete  Medication Review (RN Care Manager) Complete

## 2023-12-20 NOTE — Progress Notes (Signed)
   12/10/23 2217  Wound 12/09/23 1700 Pressure Injury Coccyx Medial Unstageable - Full thickness tissue loss in which the base of the injury is covered by slough (yellow, tan, gray, green or brown) and/or eschar (tan, brown or black) in the wound bed.  Date First Assessed/Time First Assessed: 12/09/23 1700   Present on Original Admission: Yes  Primary Wound Type: Pressure Injury  Location: Coccyx  Location Orientation: Medial  Staging: Unstageable - Full thickness tissue loss in which the base of th...  Site / Wound Assessment Clean;Dry  Peri-wound Assessment Pink  Drainage Description No odor  Drainage Amount None  Treatment Cleansed  Dressing Type Foam - Lift dressing to assess site every shift  Dressing Changed Changed  Dressing Status Clean, Dry, Intact

## 2023-12-21 LAB — AEROBIC/ANAEROBIC CULTURE W GRAM STAIN (SURGICAL/DEEP WOUND)
Culture: NO GROWTH
Gram Stain: NONE SEEN

## 2023-12-21 NOTE — Anesthesia Postprocedure Evaluation (Signed)
 Anesthesia Post Note  Patient: KENYATA NAPIER  Procedure(s) Performed: ECHOCARDIOGRAM, TRANSESOPHAGEAL  Patient location during evaluation: Specials Recovery Anesthesia Type: General Level of consciousness: awake and alert Pain management: pain level controlled Vital Signs Assessment: post-procedure vital signs reviewed and stable Respiratory status: spontaneous breathing, nonlabored ventilation, respiratory function stable and patient connected to nasal cannula oxygen Cardiovascular status: blood pressure returned to baseline and stable Postop Assessment: no apparent nausea or vomiting Anesthetic complications: no   There were no known notable events for this encounter.   Last Vitals:  Vitals:   12/20/23 0901 12/20/23 1633  BP: (!) 125/55 129/65  Pulse: 74 67  Resp: 14 16  Temp: 36.4 C (!) 36.4 C  SpO2: 97% 99%    Last Pain:  Vitals:   12/20/23 0901  TempSrc: Oral  PainSc:                  Prentice Murphy

## 2023-12-24 DIAGNOSIS — I2510 Atherosclerotic heart disease of native coronary artery without angina pectoris: Secondary | ICD-10-CM | POA: Diagnosis not present

## 2023-12-24 DIAGNOSIS — N183 Chronic kidney disease, stage 3 unspecified: Secondary | ICD-10-CM | POA: Diagnosis not present

## 2023-12-24 DIAGNOSIS — L89312 Pressure ulcer of right buttock, stage 2: Secondary | ICD-10-CM | POA: Diagnosis not present

## 2023-12-24 DIAGNOSIS — I509 Heart failure, unspecified: Secondary | ICD-10-CM | POA: Diagnosis not present

## 2023-12-24 DIAGNOSIS — L89616 Pressure-induced deep tissue damage of right heel: Secondary | ICD-10-CM | POA: Diagnosis not present

## 2023-12-24 DIAGNOSIS — D631 Anemia in chronic kidney disease: Secondary | ICD-10-CM | POA: Diagnosis not present

## 2023-12-24 DIAGNOSIS — E785 Hyperlipidemia, unspecified: Secondary | ICD-10-CM | POA: Diagnosis not present

## 2023-12-24 DIAGNOSIS — I130 Hypertensive heart and chronic kidney disease with heart failure and stage 1 through stage 4 chronic kidney disease, or unspecified chronic kidney disease: Secondary | ICD-10-CM | POA: Diagnosis not present

## 2023-12-24 DIAGNOSIS — I480 Paroxysmal atrial fibrillation: Secondary | ICD-10-CM | POA: Diagnosis not present

## 2023-12-27 DIAGNOSIS — L89312 Pressure ulcer of right buttock, stage 2: Secondary | ICD-10-CM | POA: Diagnosis not present

## 2023-12-27 DIAGNOSIS — N183 Chronic kidney disease, stage 3 unspecified: Secondary | ICD-10-CM | POA: Diagnosis not present

## 2023-12-27 DIAGNOSIS — I509 Heart failure, unspecified: Secondary | ICD-10-CM | POA: Diagnosis not present

## 2023-12-27 DIAGNOSIS — E785 Hyperlipidemia, unspecified: Secondary | ICD-10-CM | POA: Diagnosis not present

## 2023-12-27 DIAGNOSIS — I2510 Atherosclerotic heart disease of native coronary artery without angina pectoris: Secondary | ICD-10-CM | POA: Diagnosis not present

## 2023-12-27 DIAGNOSIS — L89616 Pressure-induced deep tissue damage of right heel: Secondary | ICD-10-CM | POA: Diagnosis not present

## 2023-12-27 DIAGNOSIS — I130 Hypertensive heart and chronic kidney disease with heart failure and stage 1 through stage 4 chronic kidney disease, or unspecified chronic kidney disease: Secondary | ICD-10-CM | POA: Diagnosis not present

## 2023-12-27 DIAGNOSIS — I480 Paroxysmal atrial fibrillation: Secondary | ICD-10-CM | POA: Diagnosis not present

## 2023-12-27 DIAGNOSIS — D631 Anemia in chronic kidney disease: Secondary | ICD-10-CM | POA: Diagnosis not present

## 2023-12-31 DIAGNOSIS — I509 Heart failure, unspecified: Secondary | ICD-10-CM | POA: Diagnosis not present

## 2024-01-02 DIAGNOSIS — L89312 Pressure ulcer of right buttock, stage 2: Secondary | ICD-10-CM | POA: Diagnosis not present

## 2024-01-03 ENCOUNTER — Emergency Department

## 2024-01-03 ENCOUNTER — Other Ambulatory Visit: Payer: Self-pay

## 2024-01-03 ENCOUNTER — Emergency Department
Admission: EM | Admit: 2024-01-03 | Discharge: 2024-01-03 | Disposition: A | Discharge status: Home / Self Care | Source: Ambulatory Visit | Attending: Emergency Medicine | Admitting: Emergency Medicine

## 2024-01-03 DIAGNOSIS — R29898 Other symptoms and signs involving the musculoskeletal system: Secondary | ICD-10-CM | POA: Diagnosis not present

## 2024-01-03 DIAGNOSIS — N189 Chronic kidney disease, unspecified: Secondary | ICD-10-CM | POA: Insufficient documentation

## 2024-01-03 DIAGNOSIS — R0902 Hypoxemia: Secondary | ICD-10-CM | POA: Diagnosis present

## 2024-01-03 DIAGNOSIS — J9811 Atelectasis: Secondary | ICD-10-CM | POA: Diagnosis not present

## 2024-01-03 DIAGNOSIS — R339 Retention of urine, unspecified: Secondary | ICD-10-CM | POA: Diagnosis not present

## 2024-01-03 DIAGNOSIS — J90 Pleural effusion, not elsewhere classified: Secondary | ICD-10-CM | POA: Diagnosis not present

## 2024-01-03 DIAGNOSIS — R0602 Shortness of breath: Secondary | ICD-10-CM | POA: Diagnosis not present

## 2024-01-03 DIAGNOSIS — I4891 Unspecified atrial fibrillation: Secondary | ICD-10-CM | POA: Insufficient documentation

## 2024-01-03 DIAGNOSIS — Z79899 Other long term (current) drug therapy: Secondary | ICD-10-CM | POA: Diagnosis not present

## 2024-01-03 DIAGNOSIS — I2510 Atherosclerotic heart disease of native coronary artery without angina pectoris: Secondary | ICD-10-CM | POA: Diagnosis not present

## 2024-01-03 DIAGNOSIS — R0989 Other specified symptoms and signs involving the circulatory and respiratory systems: Secondary | ICD-10-CM | POA: Diagnosis not present

## 2024-01-03 DIAGNOSIS — I959 Hypotension, unspecified: Secondary | ICD-10-CM | POA: Diagnosis not present

## 2024-01-03 DIAGNOSIS — I129 Hypertensive chronic kidney disease with stage 1 through stage 4 chronic kidney disease, or unspecified chronic kidney disease: Secondary | ICD-10-CM | POA: Diagnosis not present

## 2024-01-03 DIAGNOSIS — Z7401 Bed confinement status: Secondary | ICD-10-CM | POA: Diagnosis not present

## 2024-01-03 LAB — BASIC METABOLIC PANEL WITH GFR
Anion gap: 12 (ref 5–15)
BUN: 19 mg/dL (ref 8–23)
CO2: 24 mmol/L (ref 22–32)
Calcium: 8.7 mg/dL — ABNORMAL LOW (ref 8.9–10.3)
Chloride: 100 mmol/L (ref 98–111)
Creatinine, Ser: 1.35 mg/dL — ABNORMAL HIGH (ref 0.44–1.00)
GFR, Estimated: 38 mL/min — ABNORMAL LOW (ref >60)
Glucose, Bld: 89 mg/dL (ref 70–99)
Potassium: 4.4 mmol/L (ref 3.5–5.1)
Sodium: 136 mmol/L (ref 135–145)

## 2024-01-03 LAB — URINALYSIS, ROUTINE W REFLEX MICROSCOPIC
Bilirubin Urine: NEGATIVE
Glucose, UA: NEGATIVE mg/dL
Hgb urine dipstick: NEGATIVE
Ketones, ur: 5 mg/dL — AB
Nitrite: NEGATIVE
Protein, ur: NEGATIVE mg/dL
Specific Gravity, Urine: 1.014 (ref 1.005–1.030)
pH: 6 (ref 5.0–8.0)

## 2024-01-03 LAB — HEPATIC FUNCTION PANEL
ALT: 7 U/L (ref 0–44)
AST: 24 U/L (ref 15–41)
Albumin: 2 g/dL — ABNORMAL LOW (ref 3.5–5.0)
Alkaline Phosphatase: 53 U/L (ref 38–126)
Bilirubin, Direct: 0.2 mg/dL (ref 0.0–0.2)
Indirect Bilirubin: 0.9 mg/dL (ref 0.3–0.9)
Total Bilirubin: 1.1 mg/dL (ref 0.0–1.2)
Total Protein: 6.1 g/dL — ABNORMAL LOW (ref 6.5–8.1)

## 2024-01-03 LAB — CBC
HCT: 34.9 % — ABNORMAL LOW (ref 36.0–46.0)
Hemoglobin: 11 g/dL — ABNORMAL LOW (ref 12.0–15.0)
MCH: 28.1 pg (ref 26.0–34.0)
MCHC: 31.5 g/dL (ref 30.0–36.0)
MCV: 89.3 fL (ref 80.0–100.0)
Platelets: 322 K/uL (ref 150–400)
RBC: 3.91 MIL/uL (ref 3.87–5.11)
RDW: 21.1 % — ABNORMAL HIGH (ref 11.5–15.5)
WBC: 6.3 K/uL (ref 4.0–10.5)
nRBC: 0 % (ref 0.0–0.2)

## 2024-01-03 LAB — BRAIN NATRIURETIC PEPTIDE: B Natriuretic Peptide: 511.8 pg/mL — ABNORMAL HIGH (ref 0.0–100.0)

## 2024-01-03 LAB — LACTIC ACID, PLASMA: Lactic Acid, Venous: 0.9 mmol/L (ref 0.5–1.9)

## 2024-01-03 MED ORDER — SODIUM CHLORIDE 0.9 % IV BOLUS
500.0000 mL | Freq: Once | INTRAVENOUS | Status: AC
  Administered 2024-01-03: 500 mL via INTRAVENOUS

## 2024-01-03 NOTE — ED Provider Notes (Signed)
 [facility] Emergency Department Provider Note     None    (approximate)   History   Labs Only   HPI  Cassandra Weiss is a 89 y.o. female with a history of CAD, HTN, A-fib, CKD, and urinary retention with chronic indwelling Foley, presents to the ED accompanied by her adult son.  Patient presents to the ED via EMS from home, at the advice of the patient's home health nurse aide.  Apparently the patient had an initial visit today by the nurse provider, and in doing her previous evaluation vital signs, she was found to be slightly hypotensive and hypoxic.  According to the son who is at bedside, they were unable to get a normal pulse ox reading on the patient, she does not have a chronic O2 requirement.  Patient had a recent hospital admission for AMS, ultimately found to be due to urosepsis.  She presents to the ED today, alert and oriented to person, place, and time.  The patient also does not endorse any acute pain or discomfort.  At baseline the patient is not mobile, and spends her day in her hospital bed at home.  Home health and PT/OT are being called then to help patient increase mobility and conditioning.  No reports of any recent fevers, chills, or sweats.  No cough, congestion, chest pain, or shortness of breath reported.  No nausea, vomiting, bowel changes.  Patient has been taking her home meds as prescribed.  Chart review reveals patient a recent nurse visit (12/27/2023) with her PCPs office following the admission.     Physical Exam   Triage Vital Signs: ED Triage Vitals  Encounter Vitals Group     BP 01/03/24 1324 (!) 102/54     Girls Systolic BP Percentile --      Girls Diastolic BP Percentile --      Boys Systolic BP Percentile --      Boys Diastolic BP Percentile --      Pulse Rate 01/03/24 1324 82     Resp 01/03/24 1324 18     Temp 01/03/24 1324 98.1 F (36.7 C)     Temp src --      SpO2 01/03/24 1324 95 %     Weight 01/03/24 1322 131 lb  9.8 oz (59.7 kg)     Height 01/03/24 1322 5' (1.524 m)     Head Circumference --      Peak Flow --      Pain Score 01/03/24 1322 0     Pain Loc --      Pain Education --      Exclude from Growth Chart --     Most recent vital signs: Vitals:   01/03/24 1930 01/03/24 1956  BP: (!) 100/57 (!) 117/51  Pulse: 64 75  Resp: 18   Temp: 98.1 F (36.7 C)   SpO2: 98% 100%    General Awake, no distress. NAD HEENT NCAT. PERRL. EOMI. No rhinorrhea. Mucous membranes are moist.  CV:  Good peripheral perfusion. RRR.  No CCE distally. RESP:  Normal effort.  ABD:  No distention.  Soft and nontender.  No rebound, guarding, or rigidity noted. MSK:  Skin is warm and dry to touch.   ED Results / Procedures / Treatments   Labs (all labs ordered are listed, but only abnormal results are displayed) Labs Reviewed  CBC - Abnormal; Notable for the following components:      Result Value   Hemoglobin  11.0 (*)    HCT 34.9 (*)    RDW 21.1 (*)    All other components within normal limits  BASIC METABOLIC PANEL WITH GFR - Abnormal; Notable for the following components:   Creatinine, Ser 1.35 (*)    Calcium  8.7 (*)    GFR, Estimated 38 (*)    All other components within normal limits  URINALYSIS, ROUTINE W REFLEX MICROSCOPIC - Abnormal; Notable for the following components:   Color, Urine YELLOW (*)    APPearance HAZY (*)    Ketones, ur 5 (*)    Leukocytes,Ua LARGE (*)    Bacteria, UA RARE (*)    All other components within normal limits  HEPATIC FUNCTION PANEL - Abnormal; Notable for the following components:   Total Protein 6.1 (*)    Albumin 2.0 (*)    All other components within normal limits  BRAIN NATRIURETIC PEPTIDE - Abnormal; Notable for the following components:   B Natriuretic Peptide 511.8 (*)    All other components within normal limits  URINE CULTURE  CULTURE, BLOOD (ROUTINE X 2)  CULTURE, BLOOD (ROUTINE X 2)  LACTIC ACID, PLASMA    EKG   RADIOLOGY  I personally  viewed and evaluated these images as part of my medical decision making, as well as reviewing the written report by the radiologist.  ED Provider Interpretation: No acute infectious process  DG Chest Portable 1 View Result Date: 01/03/2024 EXAM: 1 VIEW(S) XRAY OF THE CHEST 01/03/2024 03:43:34 PM COMPARISON: 12/11/2023 CLINICAL HISTORY: SOB. Shortness of breath. FINDINGS: LUNGS AND PLEURA: Mild central pulmonary vascular congestion. Minimal bibasilar subselectasis. Small pleural effusions are noted. No focal pulmonary opacity. No pulmonary edema. No pneumothorax. HEART AND MEDIASTINUM: Stable cardiomegaly. No acute abnormality of the mediastinal silhouette. BONES AND SOFT TISSUES: Severe degenerative changes are seen involving both glenohumeral joints. No acute osseous abnormality. IMPRESSION: 1. Stable cardiomegaly with mild central pulmonary vascular congestion. 2. Minimal bibasilar subsegmental atelectasis. 3. Small bilateral pleural effusions. Electronically signed by: [name] Seip MD 01/03/2024 03:53 PM EDT RP Workstation: [id number]     PROCEDURES:  Critical Care performed: No  Procedures   MEDICATIONS ORDERED IN ED: Medications  sodium chloride  0.9 % bolus 500 mL (0 mLs Intravenous Stopped 01/03/24 1901)     IMPRESSION / MDM / ASSESSMENT AND PLAN / ED COURSE  I reviewed the triage vital signs and the nursing notes.                              Differential diagnosis includes, but is not limited to, viral syndrome, bronchitis including COPD exacerbation, pneumonia, reactive airway disease including asthma, CHF including exacerbation with or without pulmonary/interstitial edema, pneumothorax, ACS, thoracic trauma, and pulmonary embolism.  Patient's presentation is most consistent with acute complicated illness / injury requiring diagnostic workup.  Patient's diagnosis is consistent with hypotension of unclear etiology.  Geriatric patient presents to the ED after some noted low  blood pressure during her initial visit with home health nurse today.  Patient found to be mildly hypotensive on presentation, from base.  No hypoxia noted on exam.  Overall patient's workup is reassuring, her CKD is again demonstrated.  No acute leukocytosis or critical anemia on exam.  Lactic acid is normal.  Patient's BNP is mildly elevated at 511 consistent with a history of CHF.  UA shows no nitrites and only rare bacteria.  Some evidence of yeast noted.  Urine culture is pending at this  time.  Chest ray interpreted by me, shows no acute intrathoracic process.  Patient's vital signs are stable and she received a fluid bolus in the ED, which provided for stable BP reading I suggest she may be experiencing some excessive blood pressure suppression with her multiple medications.  We discussed ED admission for observation and further BP management.  The family and discussion with the patient, have decided to discharge at this time.  The patient was primarily able to verbalize her desire to go home as opposed to being admitted.  As such, I will suggest the family hold the patient's hydralazine  and amlodipine  at this time.  They should continue to monitor blood pressures and follow-up with the primary provider or cardiologist.  Patient is to follow up with cardiology as needed or otherwise directed. Patient is given ED precautions to return to the ED for any worsening or new symptoms.   FINAL CLINICAL IMPRESSION(S) / ED DIAGNOSES   Final diagnoses:  Hypotension, unspecified hypotension type     Rx / DC Orders   ED Discharge Orders     None        Note:  This document was prepared using Dragon voice recognition software and may include unintentional dictation errors.    Loyd Candida LULLA Aldona, PA-C 01/03/24 2317    Dorothyann Drivers, MD 01/03/24 2332

## 2024-01-03 NOTE — ED Notes (Addendum)
 IV Team at the bedside.

## 2024-01-03 NOTE — ED Notes (Signed)
 Pulse ox probe changed due to clip causing monitor artifact.

## 2024-01-03 NOTE — ED Triage Notes (Signed)
 First Nurse Note: Patient to ED via [facility] from home for labs. EMS reports pt has no complaints but son requesting lab work. Hx of stroke- bed bound at baseline. Alert and oriented to baseline. VS WNL

## 2024-01-03 NOTE — ED Notes (Signed)
 [facility] arrival for transport back home.

## 2024-01-03 NOTE — ED Notes (Signed)
 Family at the bedside has alerted multiple staff members to come in regarding test results that [place], [place] has went over with them extensively and informed them that the lactic acid was normal. The female family member was noted to have her phone out recording this RN while making rounds on the patient followed by passive aggressive comments regarding patients disposition, fluid bolus administration, and test results. Same visitor was informed that the disposition decision is up to the provider and they will be discussing that when they make rounds back to them. Same said family member at the bedside who was recording with phone in hand has continued treating this nurse disrespectfully. Jenise, PA informed of the same and is now in with them discussing results and disposition.

## 2024-01-03 NOTE — Discharge Instructions (Signed)
 Cassandra Weiss has an overall normal and reassuring workup at this time.  No significant change to her baseline labs.  Urine culture is pending at this time.  No indication of an overt UTI on presentation.  Please hold blood pressure medicines as discussed (amlodipine  and hydralazine ) until you have a follow-up with the PCP or cardiologist.  Continue to hydrate and give remaining meds as prescribed.  Return to the ED if needed.

## 2024-01-03 NOTE — ED Triage Notes (Signed)
 Pt comes via [facility] from home with needing labs. Unsure why pt was sent here. EMS reports they were told by son that doctor sent pt here for lab work. Pt is at baseline. Pt does have foley present. Urine looks cloudy. Pt alert to place, person.  Pt denies any pain. Pt speaking in clear complete sentences.

## 2024-01-03 NOTE — ED Notes (Signed)
 Discharge paperwork reviewed with family and patient at the bedside by [facility], [place] and also again by this RN.

## 2024-01-03 NOTE — ED Notes (Signed)
 [facility] called to have the IV team member paged for procedure to prevent further delay in care.

## 2024-01-04 LAB — URINE CULTURE
Culture: 60000 — AB
Special Requests: NORMAL

## 2024-01-05 DIAGNOSIS — L89312 Pressure ulcer of right buttock, stage 2: Secondary | ICD-10-CM | POA: Diagnosis not present

## 2024-01-05 DIAGNOSIS — E785 Hyperlipidemia, unspecified: Secondary | ICD-10-CM | POA: Diagnosis not present

## 2024-01-05 DIAGNOSIS — D631 Anemia in chronic kidney disease: Secondary | ICD-10-CM | POA: Diagnosis not present

## 2024-01-05 DIAGNOSIS — I130 Hypertensive heart and chronic kidney disease with heart failure and stage 1 through stage 4 chronic kidney disease, or unspecified chronic kidney disease: Secondary | ICD-10-CM | POA: Diagnosis not present

## 2024-01-05 DIAGNOSIS — I509 Heart failure, unspecified: Secondary | ICD-10-CM | POA: Diagnosis not present

## 2024-01-05 DIAGNOSIS — R2990 Unspecified symptoms and signs involving the nervous system: Secondary | ICD-10-CM | POA: Diagnosis not present

## 2024-01-05 DIAGNOSIS — I480 Paroxysmal atrial fibrillation: Secondary | ICD-10-CM | POA: Diagnosis not present

## 2024-01-05 DIAGNOSIS — N183 Chronic kidney disease, stage 3 unspecified: Secondary | ICD-10-CM | POA: Diagnosis not present

## 2024-01-05 DIAGNOSIS — L89616 Pressure-induced deep tissue damage of right heel: Secondary | ICD-10-CM | POA: Diagnosis not present

## 2024-01-05 DIAGNOSIS — I2510 Atherosclerotic heart disease of native coronary artery without angina pectoris: Secondary | ICD-10-CM | POA: Diagnosis not present

## 2024-01-08 LAB — CULTURE, BLOOD (ROUTINE X 2)
Culture: NO GROWTH
Culture: NO GROWTH

## 2024-01-10 DIAGNOSIS — N179 Acute kidney failure, unspecified: Secondary | ICD-10-CM | POA: Diagnosis not present

## 2024-01-10 DIAGNOSIS — I130 Hypertensive heart and chronic kidney disease with heart failure and stage 1 through stage 4 chronic kidney disease, or unspecified chronic kidney disease: Secondary | ICD-10-CM | POA: Diagnosis not present

## 2024-01-10 DIAGNOSIS — I509 Heart failure, unspecified: Secondary | ICD-10-CM | POA: Diagnosis not present

## 2024-01-10 DIAGNOSIS — N1831 Chronic kidney disease, stage 3a: Secondary | ICD-10-CM | POA: Diagnosis not present

## 2024-01-10 DIAGNOSIS — L89616 Pressure-induced deep tissue damage of right heel: Secondary | ICD-10-CM | POA: Diagnosis not present

## 2024-01-10 DIAGNOSIS — A4101 Sepsis due to Methicillin susceptible Staphylococcus aureus: Secondary | ICD-10-CM | POA: Diagnosis not present

## 2024-01-10 DIAGNOSIS — L89312 Pressure ulcer of right buttock, stage 2: Secondary | ICD-10-CM | POA: Diagnosis not present

## 2024-01-10 DIAGNOSIS — A4151 Sepsis due to Escherichia coli [E. coli]: Secondary | ICD-10-CM | POA: Diagnosis not present

## 2024-01-10 DIAGNOSIS — N390 Urinary tract infection, site not specified: Secondary | ICD-10-CM | POA: Diagnosis not present

## 2024-01-13 DIAGNOSIS — L89616 Pressure-induced deep tissue damage of right heel: Secondary | ICD-10-CM | POA: Diagnosis not present

## 2024-01-13 DIAGNOSIS — N390 Urinary tract infection, site not specified: Secondary | ICD-10-CM | POA: Diagnosis not present

## 2024-01-13 DIAGNOSIS — I130 Hypertensive heart and chronic kidney disease with heart failure and stage 1 through stage 4 chronic kidney disease, or unspecified chronic kidney disease: Secondary | ICD-10-CM | POA: Diagnosis not present

## 2024-01-13 DIAGNOSIS — A4151 Sepsis due to Escherichia coli [E. coli]: Secondary | ICD-10-CM | POA: Diagnosis not present

## 2024-01-13 DIAGNOSIS — I509 Heart failure, unspecified: Secondary | ICD-10-CM | POA: Diagnosis not present

## 2024-01-13 DIAGNOSIS — N1831 Chronic kidney disease, stage 3a: Secondary | ICD-10-CM | POA: Diagnosis not present

## 2024-01-13 DIAGNOSIS — A4101 Sepsis due to Methicillin susceptible Staphylococcus aureus: Secondary | ICD-10-CM | POA: Diagnosis not present

## 2024-01-13 DIAGNOSIS — N179 Acute kidney failure, unspecified: Secondary | ICD-10-CM | POA: Diagnosis not present

## 2024-01-13 DIAGNOSIS — L89312 Pressure ulcer of right buttock, stage 2: Secondary | ICD-10-CM | POA: Diagnosis not present

## 2024-01-16 DIAGNOSIS — L89312 Pressure ulcer of right buttock, stage 2: Secondary | ICD-10-CM | POA: Diagnosis not present

## 2024-01-16 DIAGNOSIS — N390 Urinary tract infection, site not specified: Secondary | ICD-10-CM | POA: Diagnosis not present

## 2024-01-16 DIAGNOSIS — I509 Heart failure, unspecified: Secondary | ICD-10-CM | POA: Diagnosis not present

## 2024-01-16 DIAGNOSIS — I130 Hypertensive heart and chronic kidney disease with heart failure and stage 1 through stage 4 chronic kidney disease, or unspecified chronic kidney disease: Secondary | ICD-10-CM | POA: Diagnosis not present

## 2024-01-16 DIAGNOSIS — L89616 Pressure-induced deep tissue damage of right heel: Secondary | ICD-10-CM | POA: Diagnosis not present

## 2024-01-16 DIAGNOSIS — A4151 Sepsis due to Escherichia coli [E. coli]: Secondary | ICD-10-CM | POA: Diagnosis not present

## 2024-01-16 DIAGNOSIS — N179 Acute kidney failure, unspecified: Secondary | ICD-10-CM | POA: Diagnosis not present

## 2024-01-16 DIAGNOSIS — N1831 Chronic kidney disease, stage 3a: Secondary | ICD-10-CM | POA: Diagnosis not present

## 2024-01-16 DIAGNOSIS — A4101 Sepsis due to Methicillin susceptible Staphylococcus aureus: Secondary | ICD-10-CM | POA: Diagnosis not present

## 2024-01-17 DIAGNOSIS — A4151 Sepsis due to Escherichia coli [E. coli]: Secondary | ICD-10-CM | POA: Diagnosis not present

## 2024-01-20 DIAGNOSIS — L89312 Pressure ulcer of right buttock, stage 2: Secondary | ICD-10-CM | POA: Diagnosis not present

## 2024-01-20 DIAGNOSIS — I130 Hypertensive heart and chronic kidney disease with heart failure and stage 1 through stage 4 chronic kidney disease, or unspecified chronic kidney disease: Secondary | ICD-10-CM | POA: Diagnosis not present

## 2024-01-20 DIAGNOSIS — N390 Urinary tract infection, site not specified: Secondary | ICD-10-CM | POA: Diagnosis not present

## 2024-01-20 DIAGNOSIS — L89616 Pressure-induced deep tissue damage of right heel: Secondary | ICD-10-CM | POA: Diagnosis not present

## 2024-01-20 DIAGNOSIS — N179 Acute kidney failure, unspecified: Secondary | ICD-10-CM | POA: Diagnosis not present

## 2024-01-20 DIAGNOSIS — A4101 Sepsis due to Methicillin susceptible Staphylococcus aureus: Secondary | ICD-10-CM | POA: Diagnosis not present

## 2024-01-20 DIAGNOSIS — N1831 Chronic kidney disease, stage 3a: Secondary | ICD-10-CM | POA: Diagnosis not present

## 2024-01-20 DIAGNOSIS — I509 Heart failure, unspecified: Secondary | ICD-10-CM | POA: Diagnosis not present

## 2024-01-23 DIAGNOSIS — N1831 Chronic kidney disease, stage 3a: Secondary | ICD-10-CM | POA: Diagnosis not present

## 2024-01-23 DIAGNOSIS — I509 Heart failure, unspecified: Secondary | ICD-10-CM | POA: Diagnosis not present

## 2024-01-23 DIAGNOSIS — I130 Hypertensive heart and chronic kidney disease with heart failure and stage 1 through stage 4 chronic kidney disease, or unspecified chronic kidney disease: Secondary | ICD-10-CM | POA: Diagnosis not present

## 2024-01-23 DIAGNOSIS — A4101 Sepsis due to Methicillin susceptible Staphylococcus aureus: Secondary | ICD-10-CM | POA: Diagnosis not present

## 2024-01-23 DIAGNOSIS — L89312 Pressure ulcer of right buttock, stage 2: Secondary | ICD-10-CM | POA: Diagnosis not present

## 2024-01-23 DIAGNOSIS — L89616 Pressure-induced deep tissue damage of right heel: Secondary | ICD-10-CM | POA: Diagnosis not present

## 2024-01-23 DIAGNOSIS — A4151 Sepsis due to Escherichia coli [E. coli]: Secondary | ICD-10-CM | POA: Diagnosis not present

## 2024-01-23 DIAGNOSIS — N390 Urinary tract infection, site not specified: Secondary | ICD-10-CM | POA: Diagnosis not present

## 2024-01-23 DIAGNOSIS — N179 Acute kidney failure, unspecified: Secondary | ICD-10-CM | POA: Diagnosis not present

## 2024-01-25 DIAGNOSIS — N179 Acute kidney failure, unspecified: Secondary | ICD-10-CM | POA: Diagnosis not present

## 2024-01-27 DIAGNOSIS — A4101 Sepsis due to Methicillin susceptible Staphylococcus aureus: Secondary | ICD-10-CM | POA: Diagnosis not present

## 2024-01-27 DIAGNOSIS — I130 Hypertensive heart and chronic kidney disease with heart failure and stage 1 through stage 4 chronic kidney disease, or unspecified chronic kidney disease: Secondary | ICD-10-CM | POA: Diagnosis not present

## 2024-01-27 DIAGNOSIS — N179 Acute kidney failure, unspecified: Secondary | ICD-10-CM | POA: Diagnosis not present

## 2024-01-27 DIAGNOSIS — L89312 Pressure ulcer of right buttock, stage 2: Secondary | ICD-10-CM | POA: Diagnosis not present

## 2024-01-27 DIAGNOSIS — I509 Heart failure, unspecified: Secondary | ICD-10-CM | POA: Diagnosis not present

## 2024-01-27 DIAGNOSIS — N1831 Chronic kidney disease, stage 3a: Secondary | ICD-10-CM | POA: Diagnosis not present

## 2024-01-27 DIAGNOSIS — A4151 Sepsis due to Escherichia coli [E. coli]: Secondary | ICD-10-CM | POA: Diagnosis not present

## 2024-01-27 DIAGNOSIS — N390 Urinary tract infection, site not specified: Secondary | ICD-10-CM | POA: Diagnosis not present

## 2024-01-27 DIAGNOSIS — L89616 Pressure-induced deep tissue damage of right heel: Secondary | ICD-10-CM | POA: Diagnosis not present

## 2024-01-31 DIAGNOSIS — I679 Cerebrovascular disease, unspecified: Secondary | ICD-10-CM | POA: Diagnosis not present

## 2024-01-31 DIAGNOSIS — R339 Retention of urine, unspecified: Secondary | ICD-10-CM | POA: Diagnosis not present

## 2024-01-31 DIAGNOSIS — I69351 Hemiplegia and hemiparesis following cerebral infarction affecting right dominant side: Secondary | ICD-10-CM | POA: Diagnosis not present

## 2024-01-31 DIAGNOSIS — E43 Unspecified severe protein-calorie malnutrition: Secondary | ICD-10-CM | POA: Diagnosis not present

## 2024-01-31 DIAGNOSIS — E7849 Other hyperlipidemia: Secondary | ICD-10-CM | POA: Diagnosis not present

## 2024-01-31 DIAGNOSIS — I10 Essential (primary) hypertension: Secondary | ICD-10-CM | POA: Diagnosis not present

## 2024-01-31 DIAGNOSIS — I480 Paroxysmal atrial fibrillation: Secondary | ICD-10-CM | POA: Diagnosis not present

## 2024-02-15 DIAGNOSIS — R2990 Unspecified symptoms and signs involving the nervous system: Secondary | ICD-10-CM | POA: Diagnosis not present
